# Patient Record
Sex: Female | Born: 1945 | Race: White | Hispanic: No | Marital: Married | Smoking: Never smoker
Health system: Southern US, Community
[De-identification: ages and names within clinical notes are randomized; demographics above are authoritative.]

## PROBLEM LIST (undated history)

## (undated) DIAGNOSIS — I10 Essential (primary) hypertension: Secondary | ICD-10-CM

## (undated) DIAGNOSIS — J189 Pneumonia, unspecified organism: Secondary | ICD-10-CM

## (undated) DIAGNOSIS — H9192 Unspecified hearing loss, left ear: Secondary | ICD-10-CM

## (undated) DIAGNOSIS — J45909 Unspecified asthma, uncomplicated: Secondary | ICD-10-CM

## (undated) DIAGNOSIS — K219 Gastro-esophageal reflux disease without esophagitis: Secondary | ICD-10-CM

## (undated) DIAGNOSIS — G4730 Sleep apnea, unspecified: Secondary | ICD-10-CM

## (undated) HISTORY — PX: CHOLECYSTECTOMY: SHX55

## (undated) HISTORY — PX: TUBAL LIGATION: SHX77

## (undated) HISTORY — PX: ABDOMINAL HYSTERECTOMY: SHX81

---

## 1997-09-13 ENCOUNTER — Ambulatory Visit (HOSPITAL_COMMUNITY): Admission: RE | Admit: 1997-09-13 | Discharge: 1997-09-13 | Payer: Self-pay | Admitting: Obstetrics and Gynecology

## 1997-10-05 ENCOUNTER — Ambulatory Visit (HOSPITAL_COMMUNITY): Admission: RE | Admit: 1997-10-05 | Discharge: 1997-10-05 | Payer: Self-pay | Admitting: Obstetrics and Gynecology

## 1997-11-30 ENCOUNTER — Inpatient Hospital Stay (HOSPITAL_COMMUNITY): Admission: RE | Admit: 1997-11-30 | Discharge: 1997-12-02 | Payer: Self-pay | Admitting: Obstetrics and Gynecology

## 1998-05-13 ENCOUNTER — Ambulatory Visit (HOSPITAL_COMMUNITY): Admission: RE | Admit: 1998-05-13 | Discharge: 1998-05-13 | Payer: Self-pay | Admitting: Internal Medicine

## 1998-05-13 ENCOUNTER — Encounter: Payer: Self-pay | Admitting: Internal Medicine

## 1998-11-07 ENCOUNTER — Encounter: Payer: Self-pay | Admitting: Obstetrics and Gynecology

## 1998-11-07 ENCOUNTER — Encounter: Admission: RE | Admit: 1998-11-07 | Discharge: 1998-11-07 | Payer: Self-pay | Admitting: Obstetrics and Gynecology

## 1999-11-10 ENCOUNTER — Encounter: Admission: RE | Admit: 1999-11-10 | Discharge: 1999-11-10 | Payer: Self-pay | Admitting: Obstetrics and Gynecology

## 1999-11-10 ENCOUNTER — Encounter: Payer: Self-pay | Admitting: Obstetrics and Gynecology

## 1999-12-29 ENCOUNTER — Encounter: Payer: Self-pay | Admitting: Internal Medicine

## 1999-12-29 ENCOUNTER — Encounter: Admission: RE | Admit: 1999-12-29 | Discharge: 1999-12-29 | Payer: Self-pay | Admitting: Internal Medicine

## 2000-01-02 ENCOUNTER — Encounter: Admission: RE | Admit: 2000-01-02 | Discharge: 2000-01-02 | Payer: Self-pay | Admitting: Internal Medicine

## 2000-01-02 ENCOUNTER — Encounter: Payer: Self-pay | Admitting: Internal Medicine

## 2000-10-22 ENCOUNTER — Other Ambulatory Visit: Admission: RE | Admit: 2000-10-22 | Discharge: 2000-10-22 | Payer: Self-pay | Admitting: Obstetrics and Gynecology

## 2000-11-10 ENCOUNTER — Encounter: Payer: Self-pay | Admitting: Obstetrics and Gynecology

## 2000-11-10 ENCOUNTER — Encounter: Admission: RE | Admit: 2000-11-10 | Discharge: 2000-11-10 | Payer: Self-pay | Admitting: Obstetrics and Gynecology

## 2001-09-21 ENCOUNTER — Ambulatory Visit (HOSPITAL_COMMUNITY): Admission: RE | Admit: 2001-09-21 | Discharge: 2001-09-21 | Payer: Self-pay | Admitting: Gastroenterology

## 2001-09-21 ENCOUNTER — Encounter (INDEPENDENT_AMBULATORY_CARE_PROVIDER_SITE_OTHER): Payer: Self-pay | Admitting: *Deleted

## 2001-11-11 ENCOUNTER — Encounter: Payer: Self-pay | Admitting: Obstetrics and Gynecology

## 2001-11-11 ENCOUNTER — Encounter: Admission: RE | Admit: 2001-11-11 | Discharge: 2001-11-11 | Payer: Self-pay | Admitting: Obstetrics and Gynecology

## 2002-11-28 ENCOUNTER — Encounter: Admission: RE | Admit: 2002-11-28 | Discharge: 2002-11-28 | Payer: Self-pay | Admitting: Obstetrics and Gynecology

## 2002-11-30 ENCOUNTER — Encounter: Admission: RE | Admit: 2002-11-30 | Discharge: 2002-11-30 | Payer: Self-pay | Admitting: Obstetrics and Gynecology

## 2003-12-25 ENCOUNTER — Encounter: Admission: RE | Admit: 2003-12-25 | Discharge: 2003-12-25 | Payer: Self-pay | Admitting: Obstetrics and Gynecology

## 2004-12-30 ENCOUNTER — Encounter: Admission: RE | Admit: 2004-12-30 | Discharge: 2004-12-30 | Payer: Self-pay | Admitting: Obstetrics and Gynecology

## 2006-02-04 ENCOUNTER — Encounter: Admission: RE | Admit: 2006-02-04 | Discharge: 2006-02-04 | Payer: Self-pay | Admitting: Obstetrics and Gynecology

## 2007-03-31 IMAGING — MG MM MAMMO SCREENING
4 series · 4 of 4 positions shown · non-contrast
Comparison: none

SCREENING MAMMOGRAM:
There is a fibroglandular pattern.  No masses or malignant type calcifications are identified.  
Compared with prior studies.

[R CC]
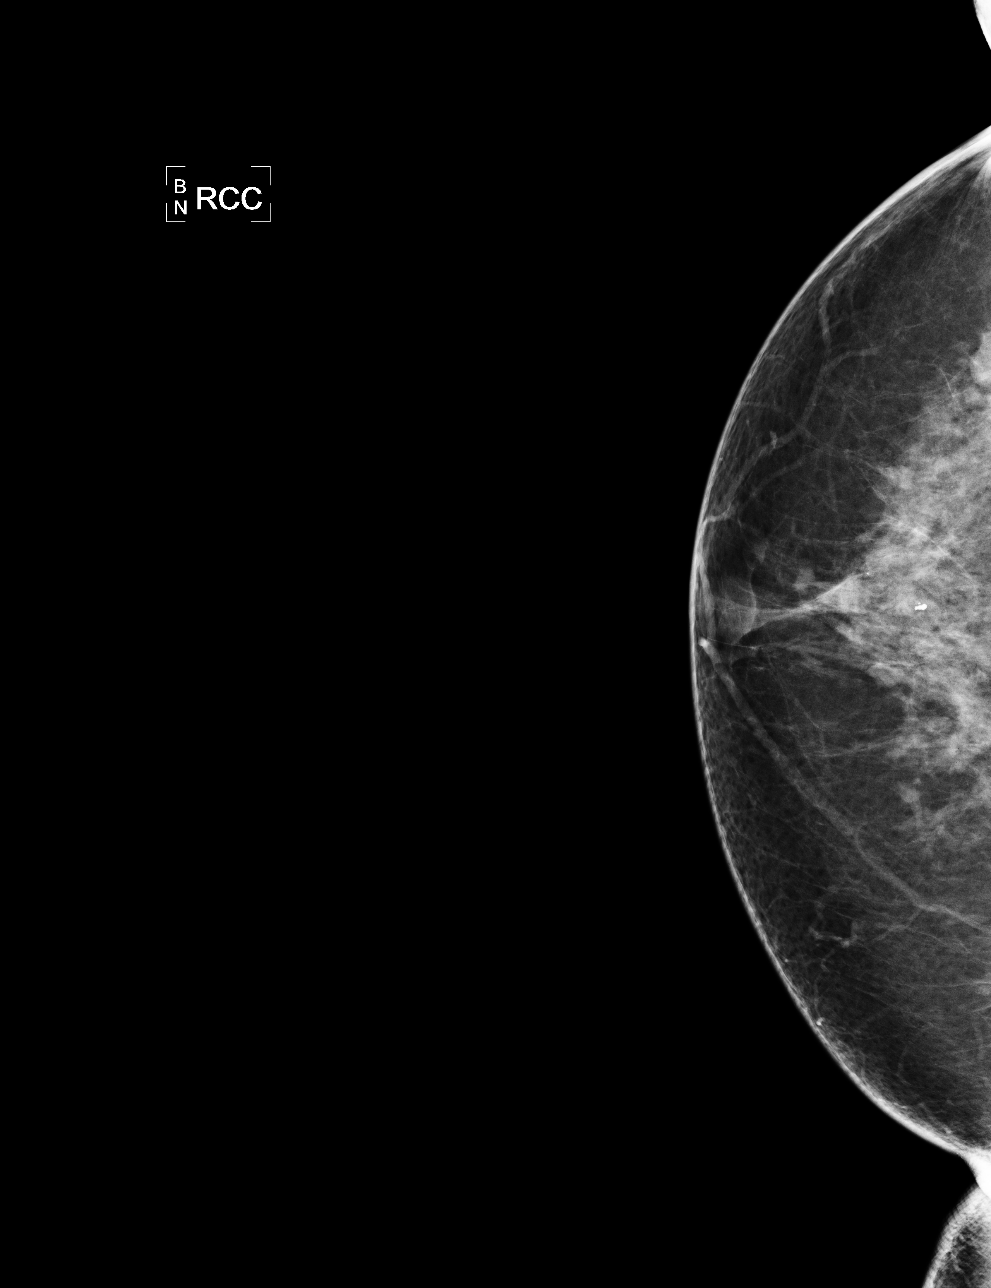

[L CC]
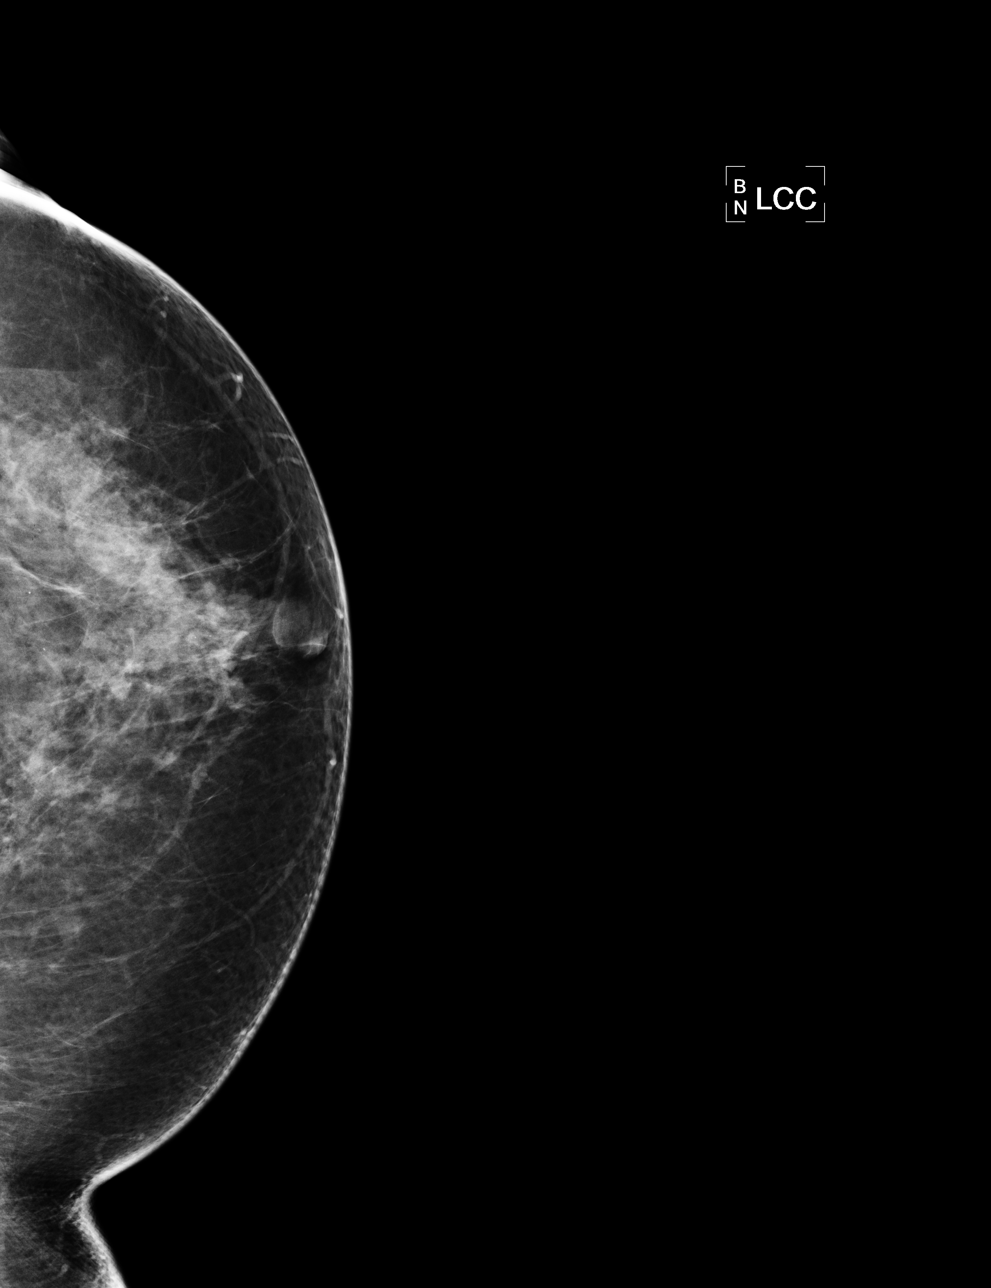

[L MLO]
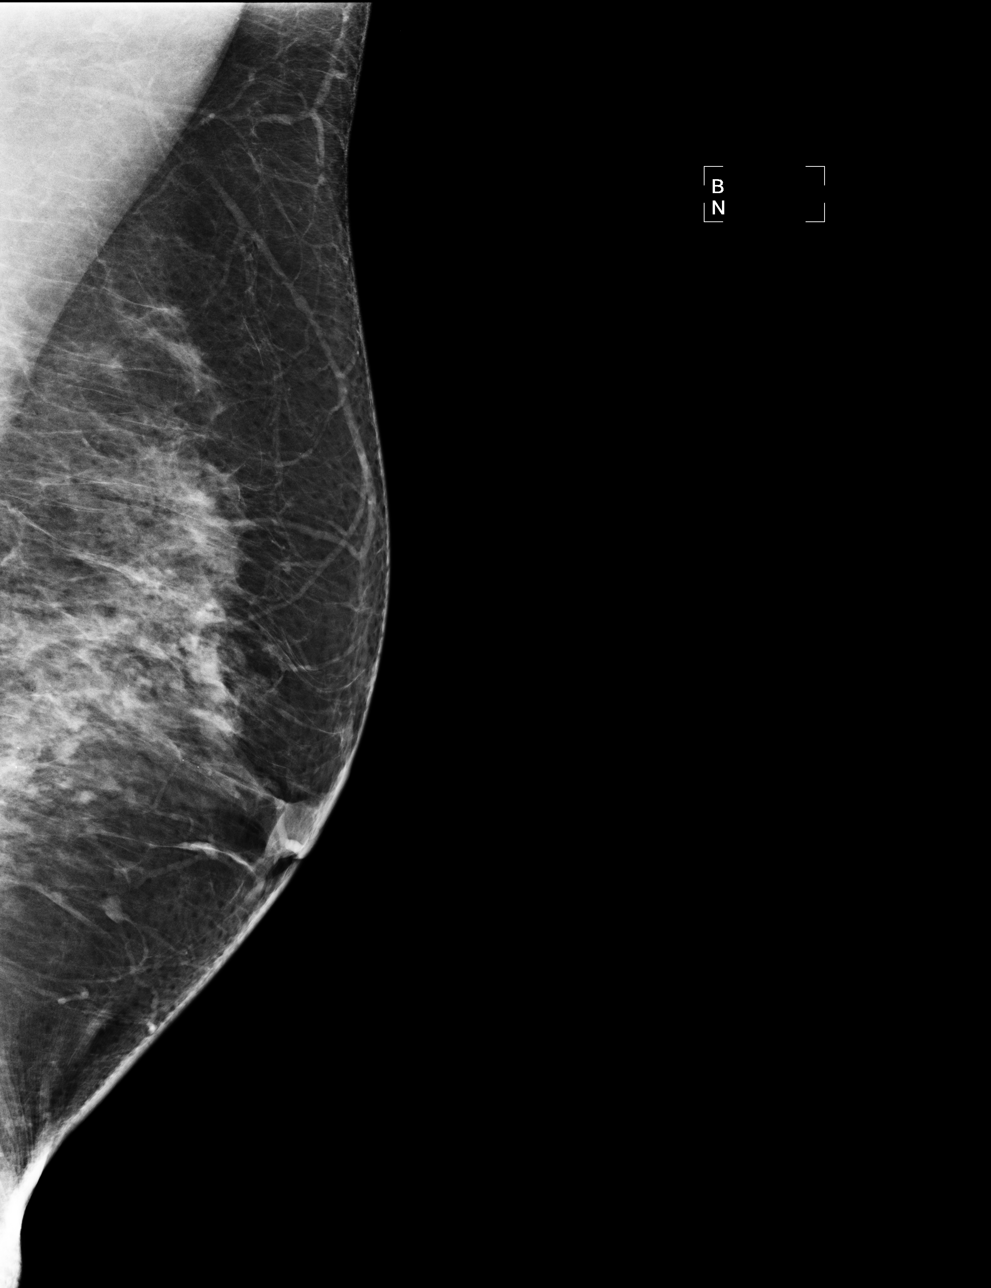

[R MLO]
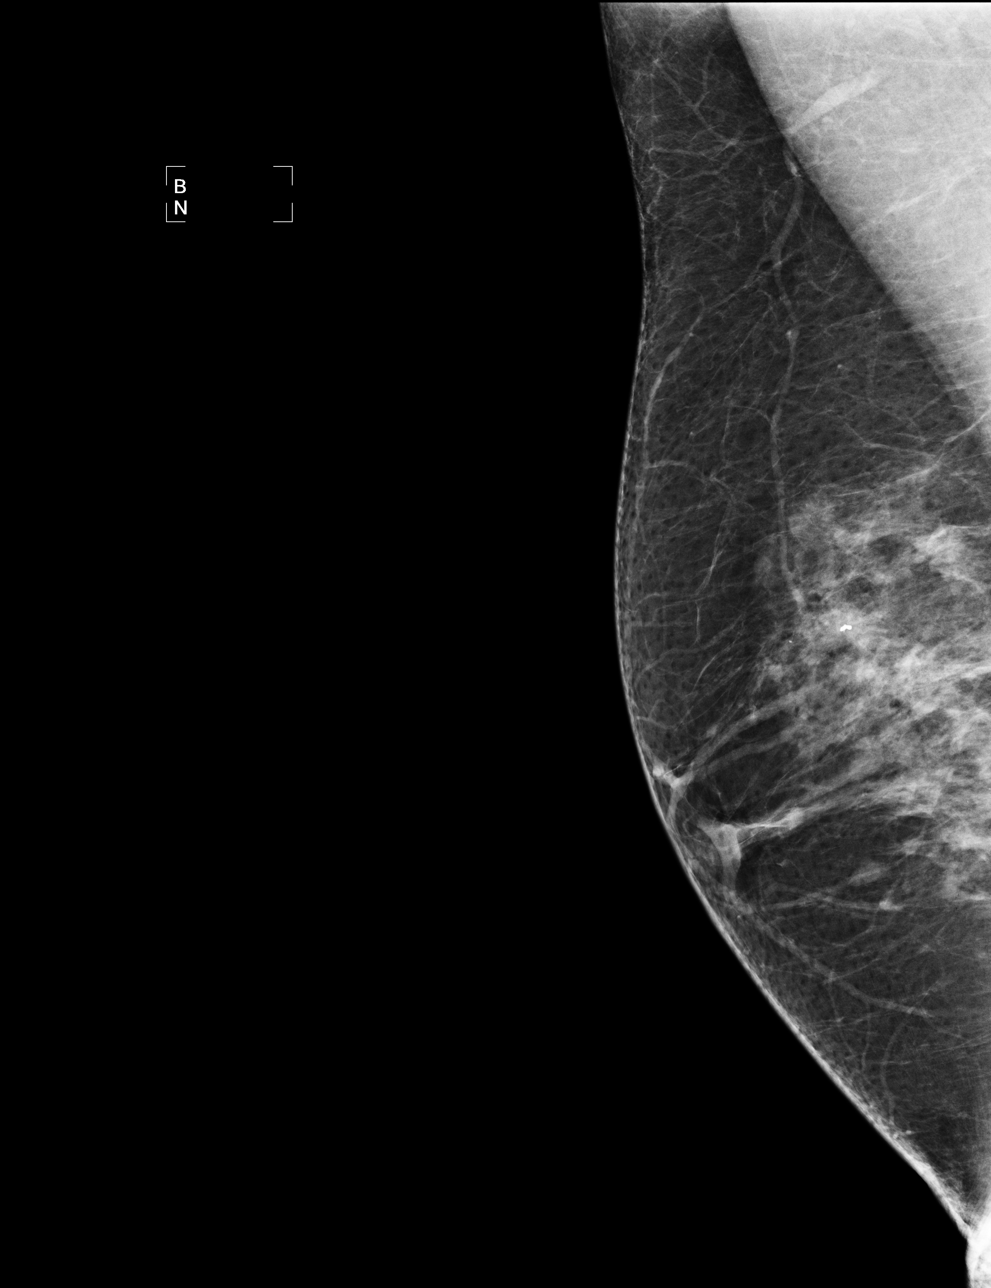

[4 of 4 positions shown; findings below may reference images not displayed]

IMPRESSION: No specific mammographic evidence of malignancy.  Next screening mammogram is recommended in one 
year.

ASSESSMENT: Negative - BI-RADS 1

Screening mammogram in 1 year.

## 2007-05-06 ENCOUNTER — Emergency Department (HOSPITAL_COMMUNITY): Admission: EM | Admit: 2007-05-06 | Discharge: 2007-05-06 | Payer: Self-pay | Admitting: Emergency Medicine

## 2008-02-08 ENCOUNTER — Encounter: Admission: RE | Admit: 2008-02-08 | Discharge: 2008-02-08 | Payer: Self-pay | Admitting: Internal Medicine

## 2008-02-14 ENCOUNTER — Encounter: Admission: RE | Admit: 2008-02-14 | Discharge: 2008-02-14 | Payer: Self-pay | Admitting: Internal Medicine

## 2008-05-05 IMAGING — MG MM SCREEN MAMMOGRAM BILATERAL
5 series · 5 of 5 positions shown · non-contrast
Comparison: none

DG SCREEN MAMMOGRAM BILATERAL
Bilateral CC and MLO view(s) were taken.

DIGITAL SCREENING MAMMOGRAM WITH CAD:
There is a  dense fibroglandular pattern.  No masses or malignant type calcifications are 
identified.  Compared with prior studies.

[R CC]
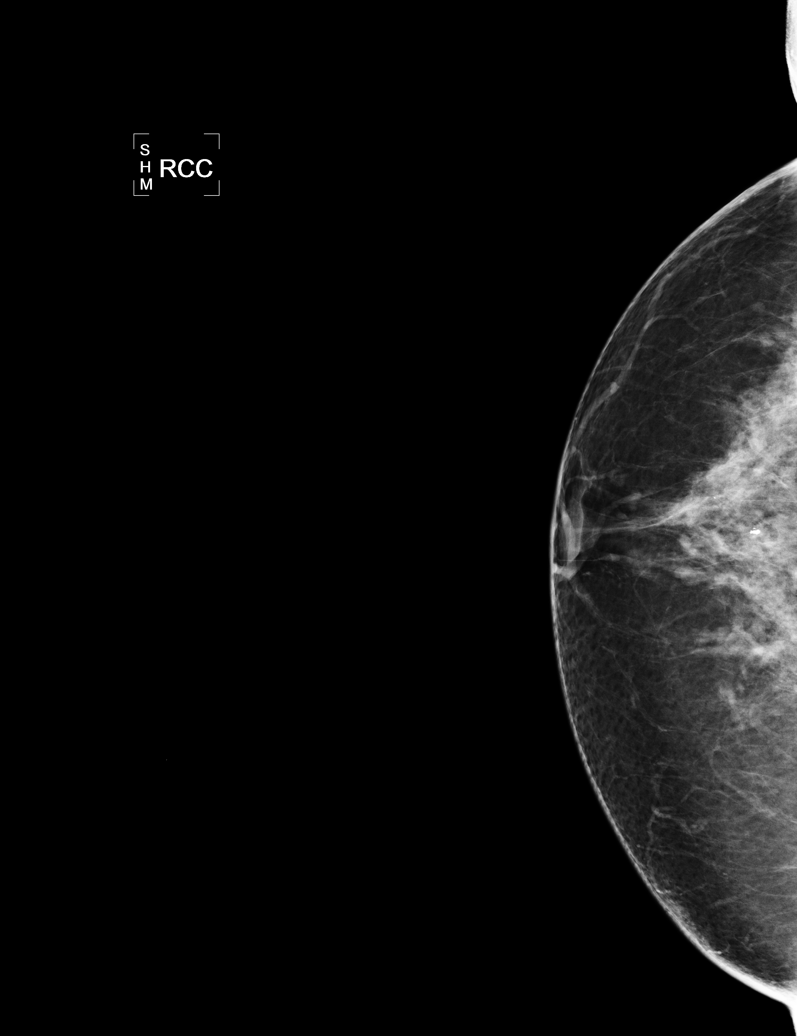

[L CC (1 of 2)]
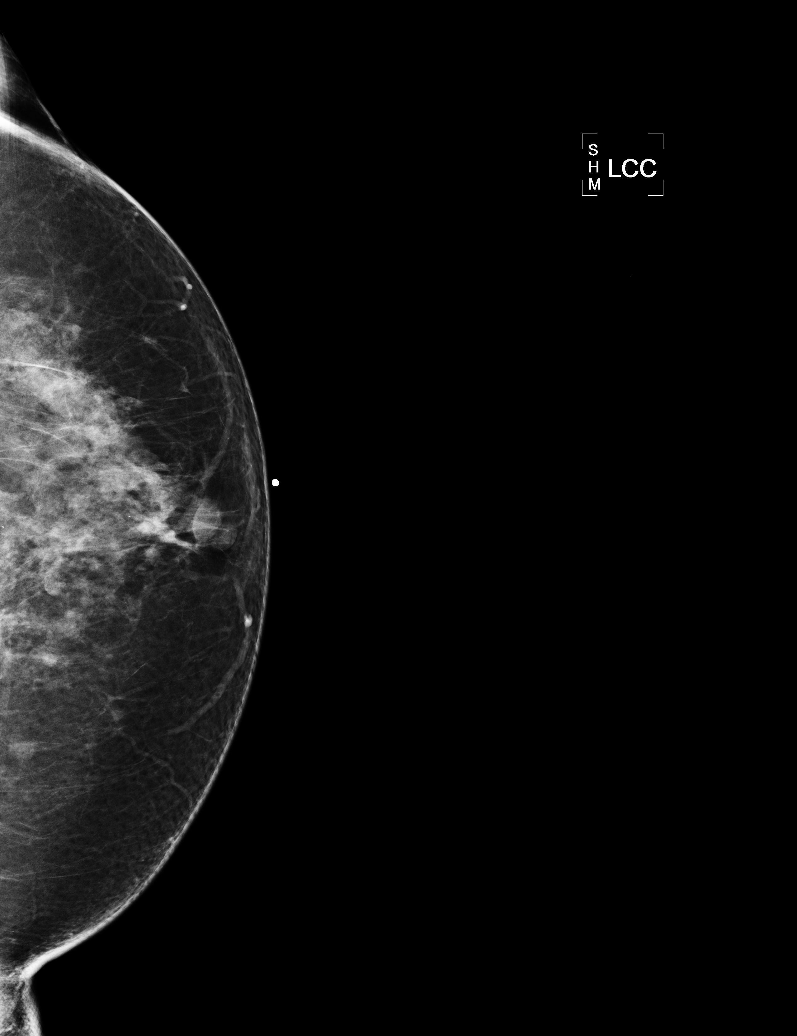

[L MLO]
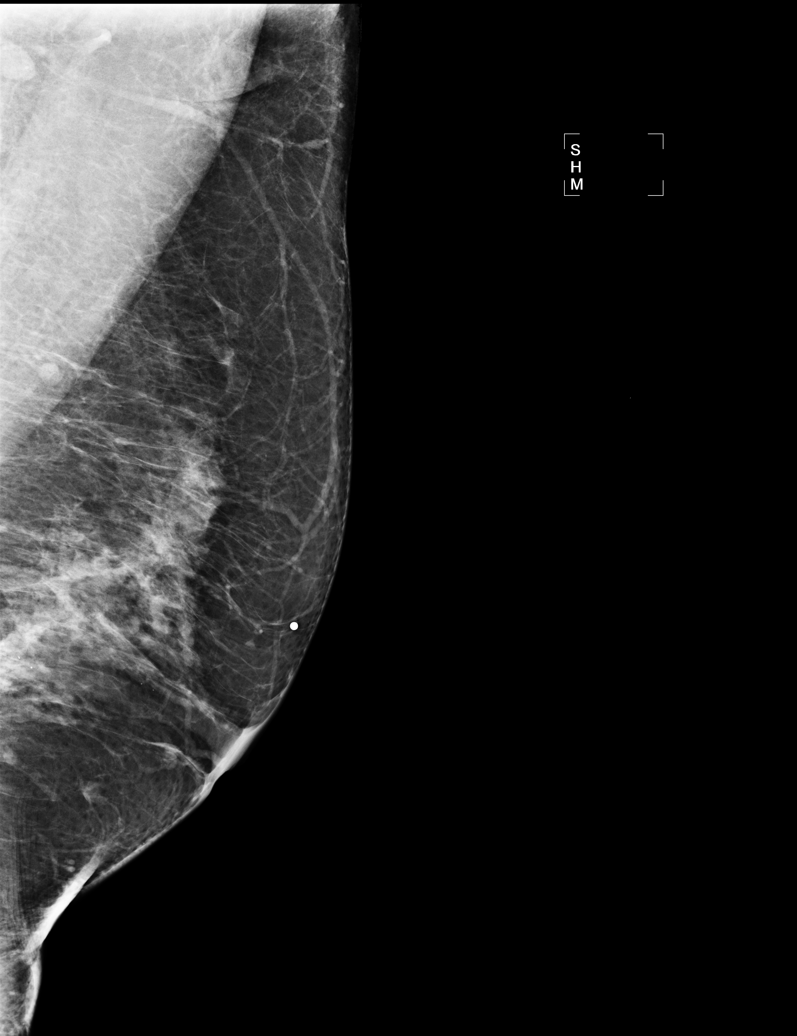

[R MLO]
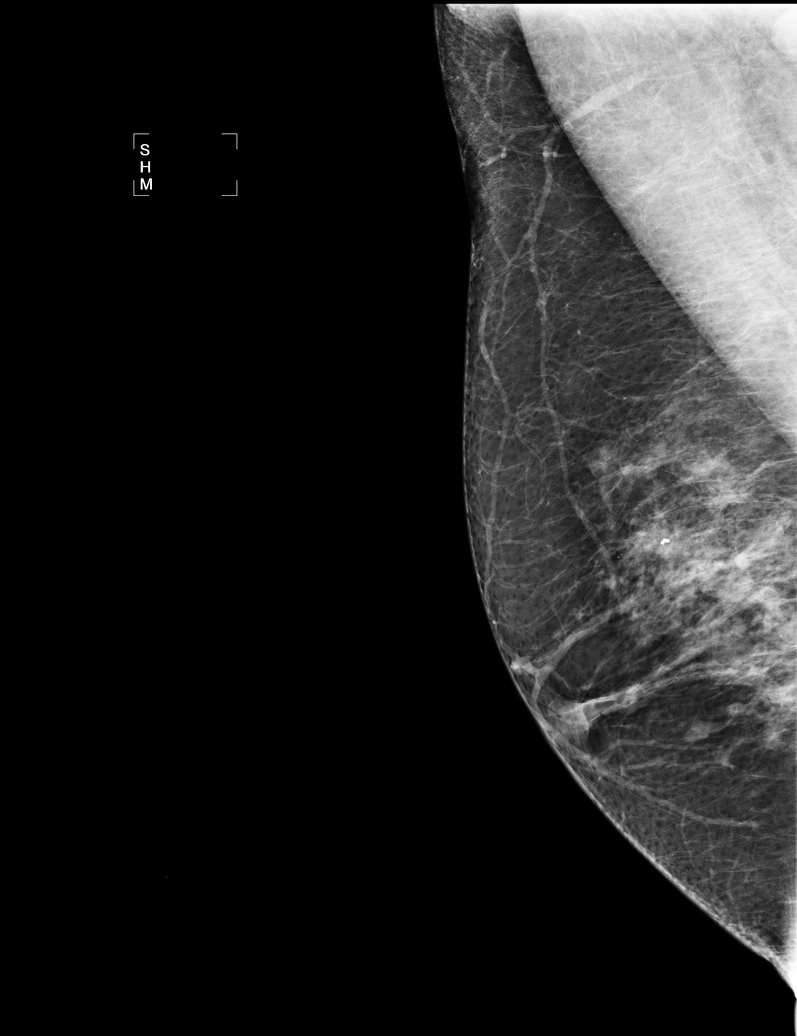

[L CC (2 of 2)]
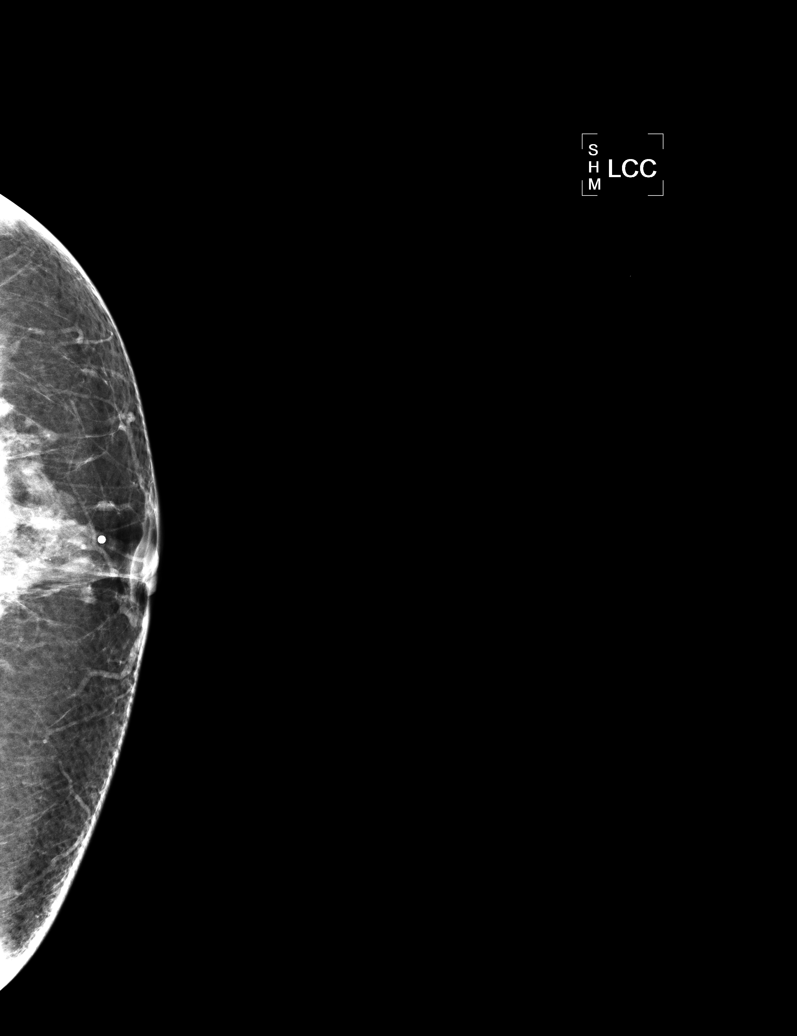

[5 of 5 positions shown; findings below may reference images not displayed]

IMPRESSION: No specific mammographic evidence of malignancy.  Next screening mammogram is recommended in one 
year.

ASSESSMENT: Negative - BI-RADS 1

Screening mammogram in 1 year.
ANALYZED BY COMPUTER AIDED DETECTION. , THIS PROCEDURE WAS A DIGITAL MAMMOGRAM.

## 2009-02-20 ENCOUNTER — Encounter: Admission: RE | Admit: 2009-02-20 | Discharge: 2009-02-20 | Payer: Self-pay | Admitting: Internal Medicine
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
## 2009-08-28 ENCOUNTER — Encounter: Admission: RE | Admit: 2009-08-28 | Discharge: 2009-08-28 | Payer: Self-pay | Admitting: Otolaryngology

## 2010-03-27 ENCOUNTER — Other Ambulatory Visit: Payer: Self-pay | Admitting: Internal Medicine

## 2010-03-27 DIAGNOSIS — Z1231 Encounter for screening mammogram for malignant neoplasm of breast: Secondary | ICD-10-CM

## 2010-04-17 ENCOUNTER — Ambulatory Visit
Admission: RE | Admit: 2010-04-17 | Discharge: 2010-04-17 | Disposition: A | Discharge status: Home / Self Care | Payer: Self-pay | Source: Ambulatory Visit | Attending: Internal Medicine | Admitting: Internal Medicine

## 2010-04-17 ENCOUNTER — Other Ambulatory Visit: Payer: Self-pay | Admitting: Internal Medicine

## 2010-04-17 DIAGNOSIS — Z1231 Encounter for screening mammogram for malignant neoplasm of breast: Secondary | ICD-10-CM

## 2010-04-17 DIAGNOSIS — N6325 Unspecified lump in the left breast, overlapping quadrants: Secondary | ICD-10-CM

## 2010-04-22 ENCOUNTER — Ambulatory Visit
Admission: RE | Admit: 2010-04-22 | Discharge: 2010-04-22 | Disposition: A | Discharge status: Home / Self Care | Payer: 59 | Source: Ambulatory Visit | Attending: Internal Medicine | Admitting: Internal Medicine

## 2010-04-22 ENCOUNTER — Other Ambulatory Visit: Payer: Self-pay | Admitting: Internal Medicine

## 2010-04-22 DIAGNOSIS — N6325 Unspecified lump in the left breast, overlapping quadrants: Secondary | ICD-10-CM

## 2010-05-09 IMAGING — MG MM SCREEN MAMMOGRAM BILATERAL
4 series · 4 of 4 positions shown · non-contrast
Comparison: none

DG SCREEN MAMMOGRAM BILATERAL
Bilateral CC and MLO view(s) were taken.
Technologist: Gitte Juul Zawadi

DIGITAL SCREENING MAMMOGRAM WITH CAD:
There are scattered fibroglandular densities.  A possible mass is noted in the right breast.  Spot 
compression views and possibly sonography are recommended for further evaluation.  In the left 
breast, no masses or malignant type calcifications are identified.  Compared with prior studies.

[R CC]
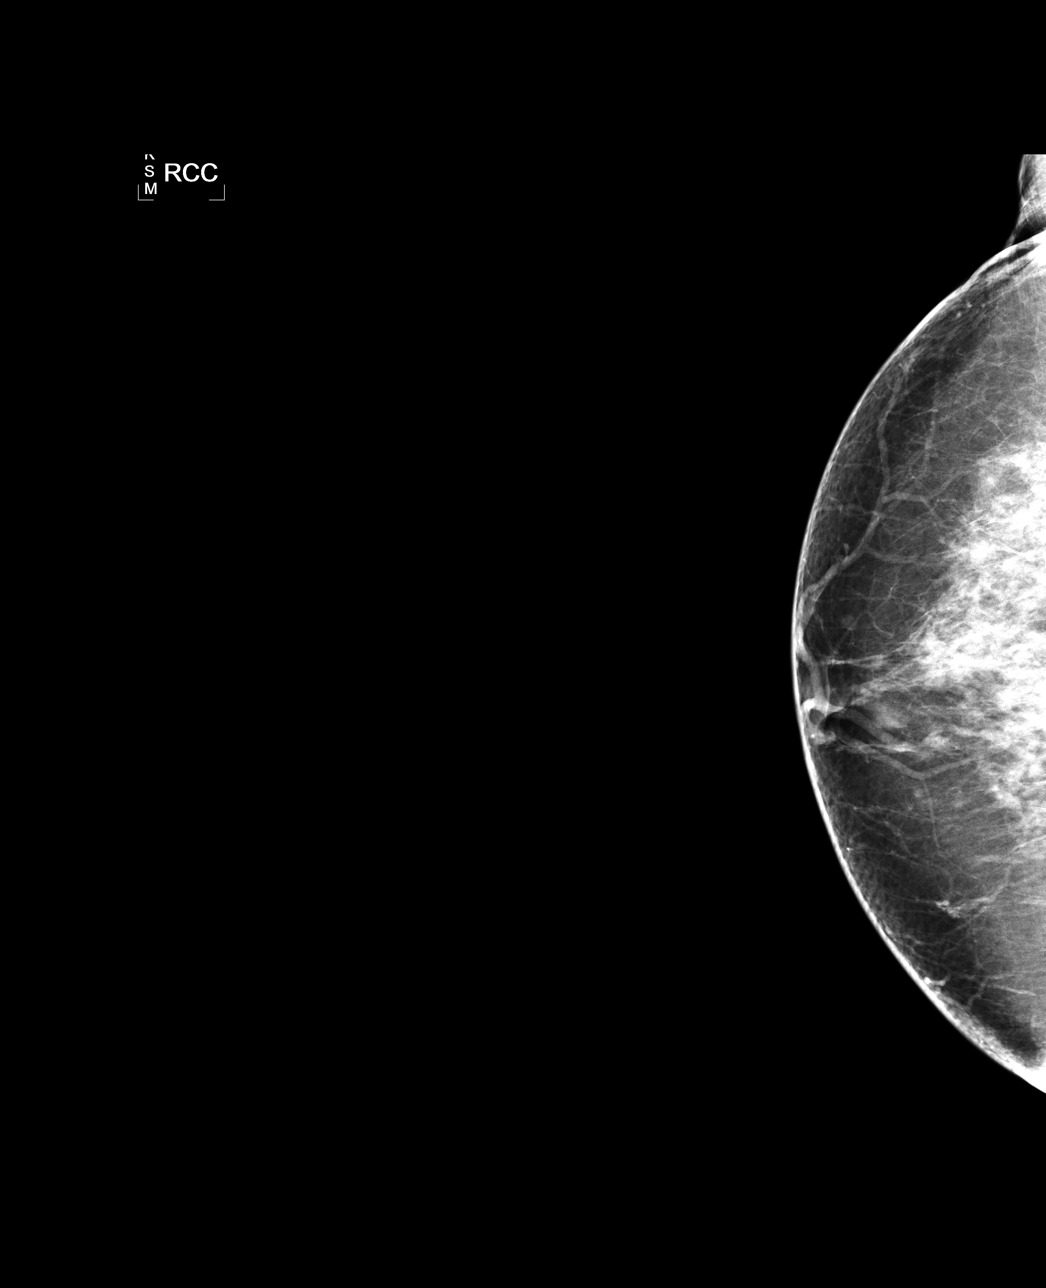

[L CC]
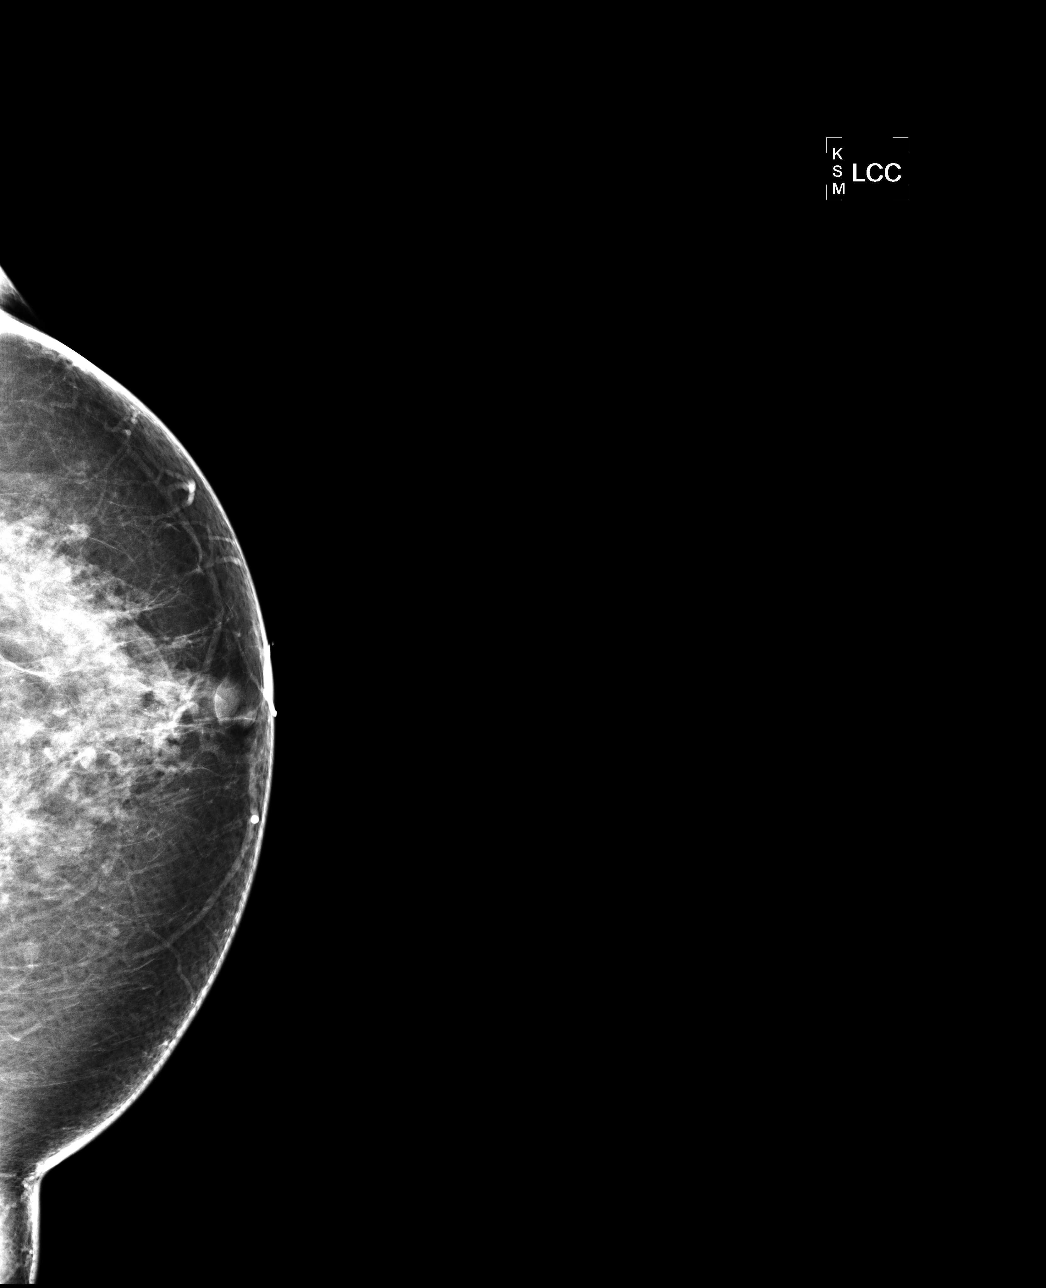

[L MLO]
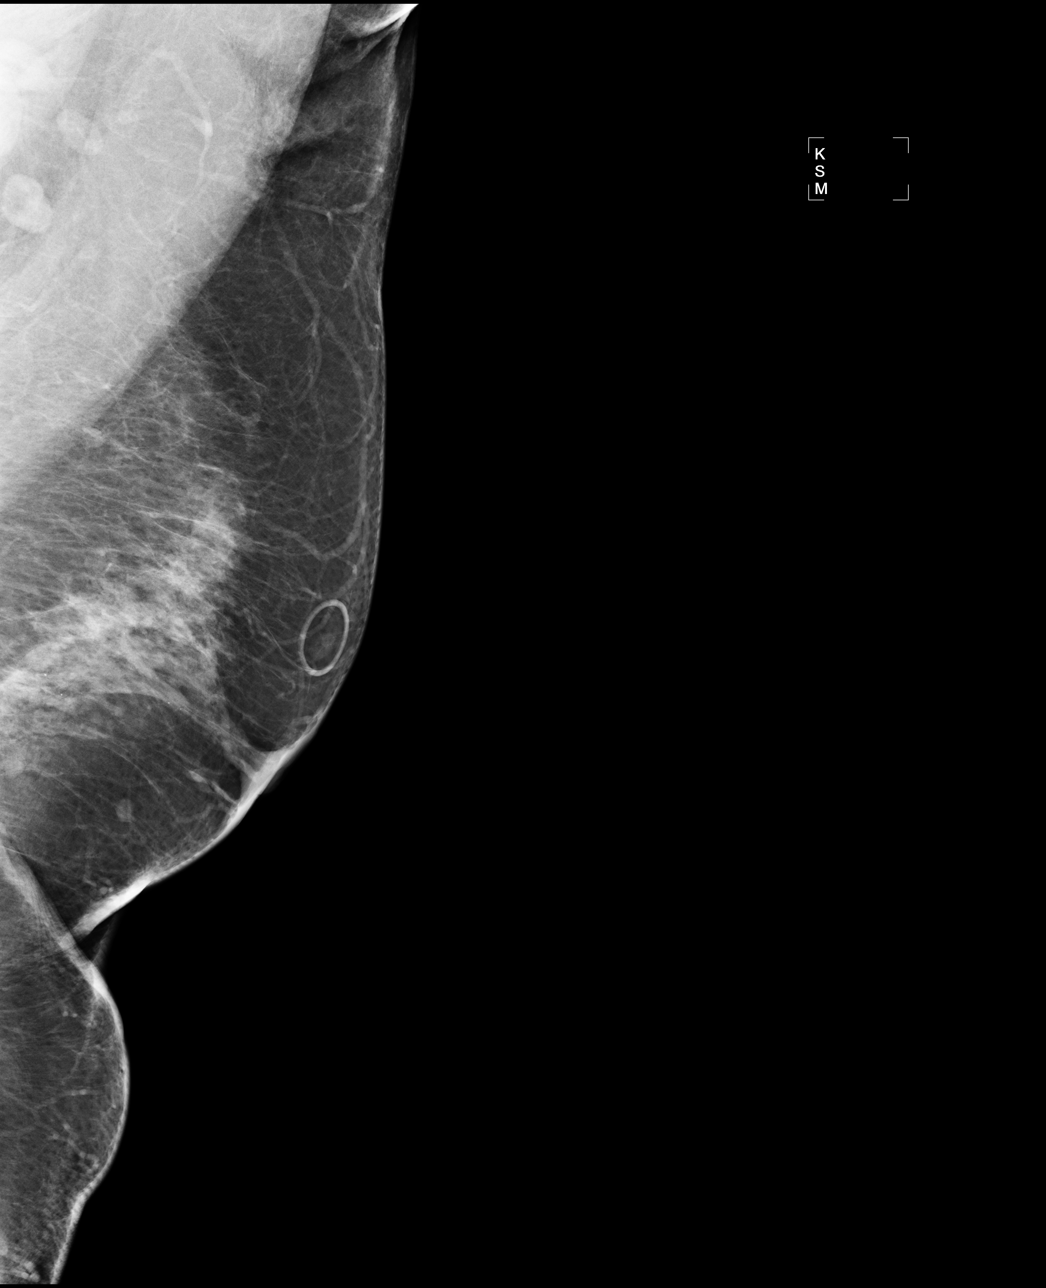

[R MLO]
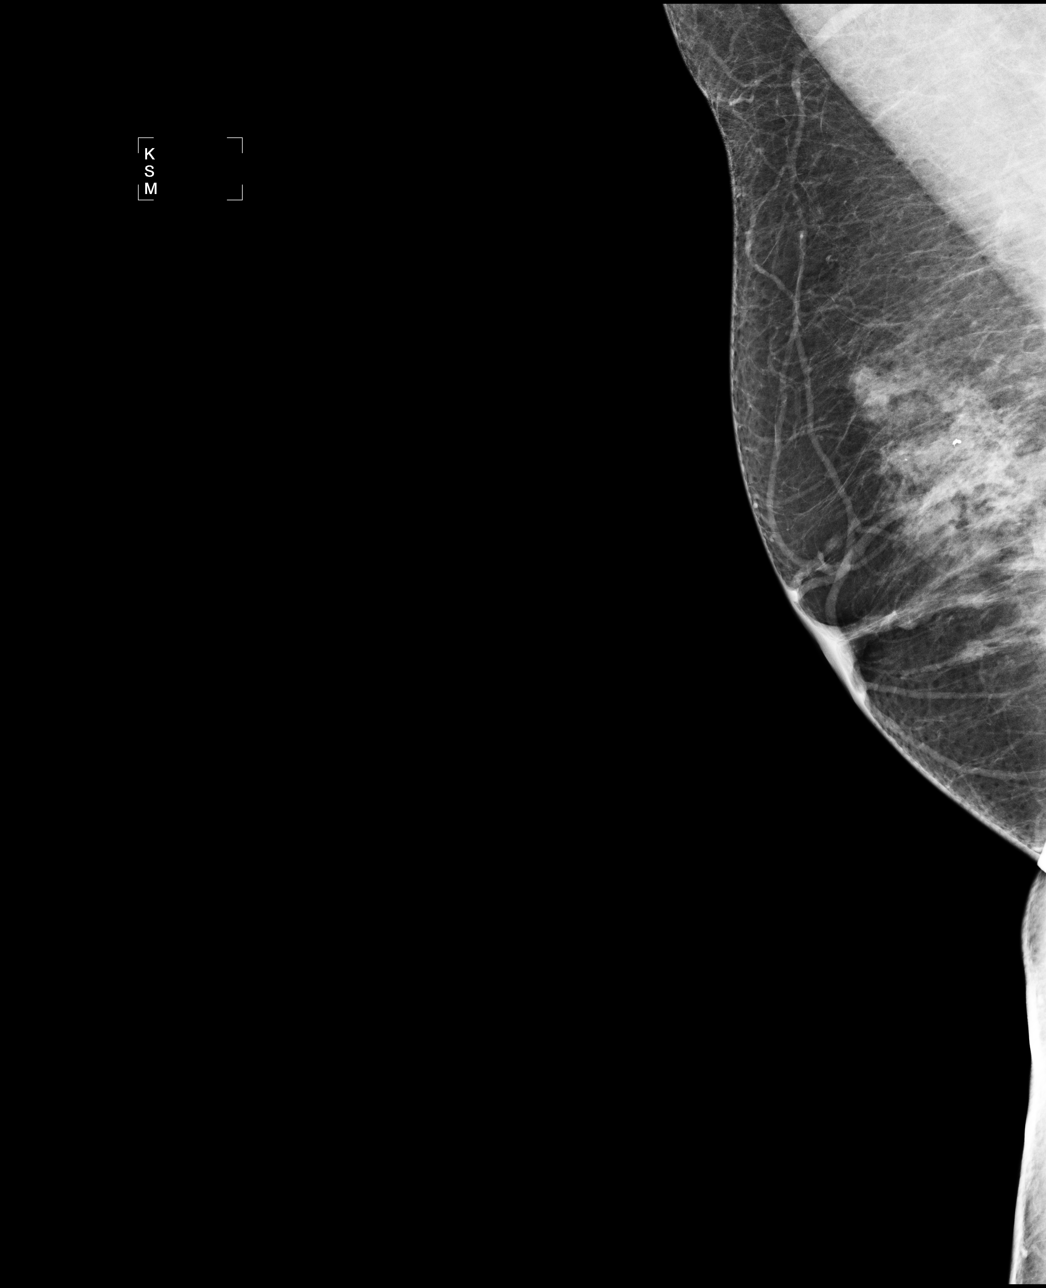

[4 of 4 positions shown; findings below may reference images not displayed]

IMPRESSION: Possible mass, right breast.  Additional evaluation is indicated.  The patient will be contacted 
for additional studies and a supplementary report will follow.  No specific mammographic evidence 
of malignancy, left breast.

ASSESSMENT: Need additional imaging evaluation and/or prior mammograms for comparison - BI-RADS 0

Further imaging of the right breast.
ANALYZED BY COMPUTER AIDED DETECTION. , THIS PROCEDURE WAS A DIGITAL MAMMOGRAM.

## 2010-05-15 IMAGING — MG MM DIAGNOSTIC LTD RIGHT
2 series · 2 of 2 positions shown · non-contrast
Comparison: 12/30/2004 and 02/04/2006

CLINICAL DATA: The patient returns for evaluation of a possible
asymmetry in the right breast noted on recent screening study dated
02/08/2008.

DIGITAL DIAGNOSTIC  RIGHT LIMITED  MAMMOGRAM  WITH CAD AND RIGHT
BREAST ULTRASOUND:

[R MLO]
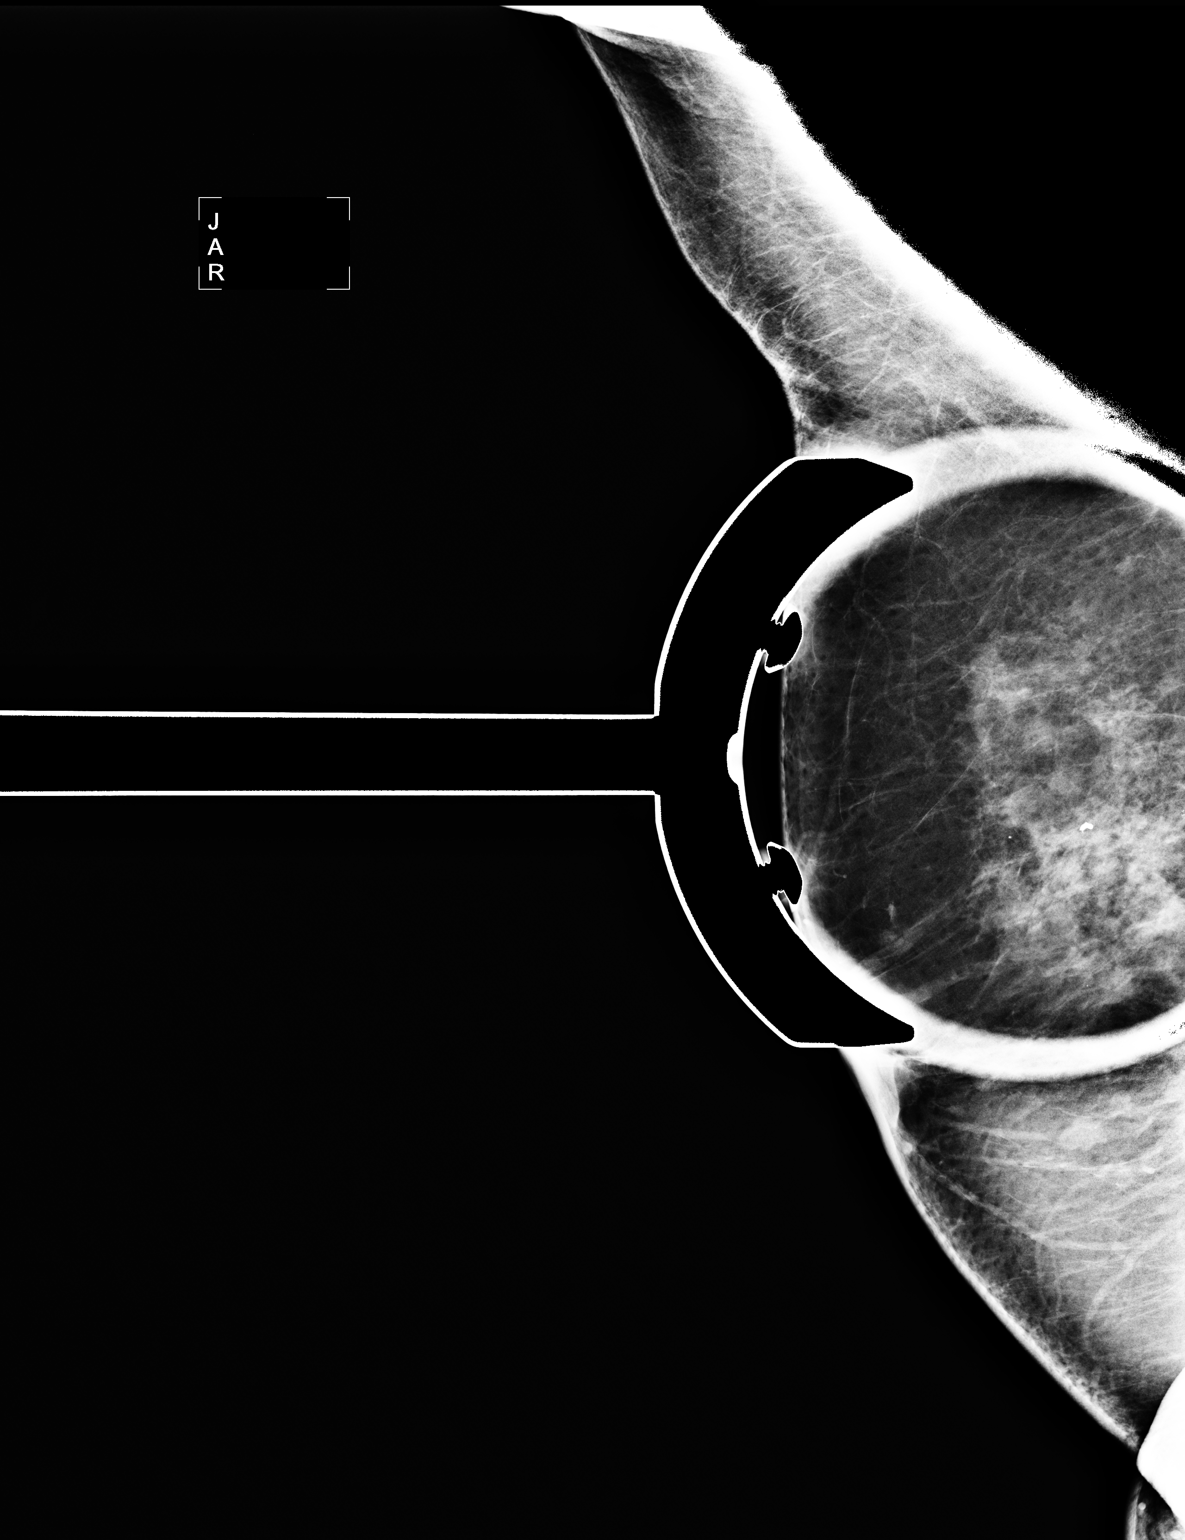

[R ML]
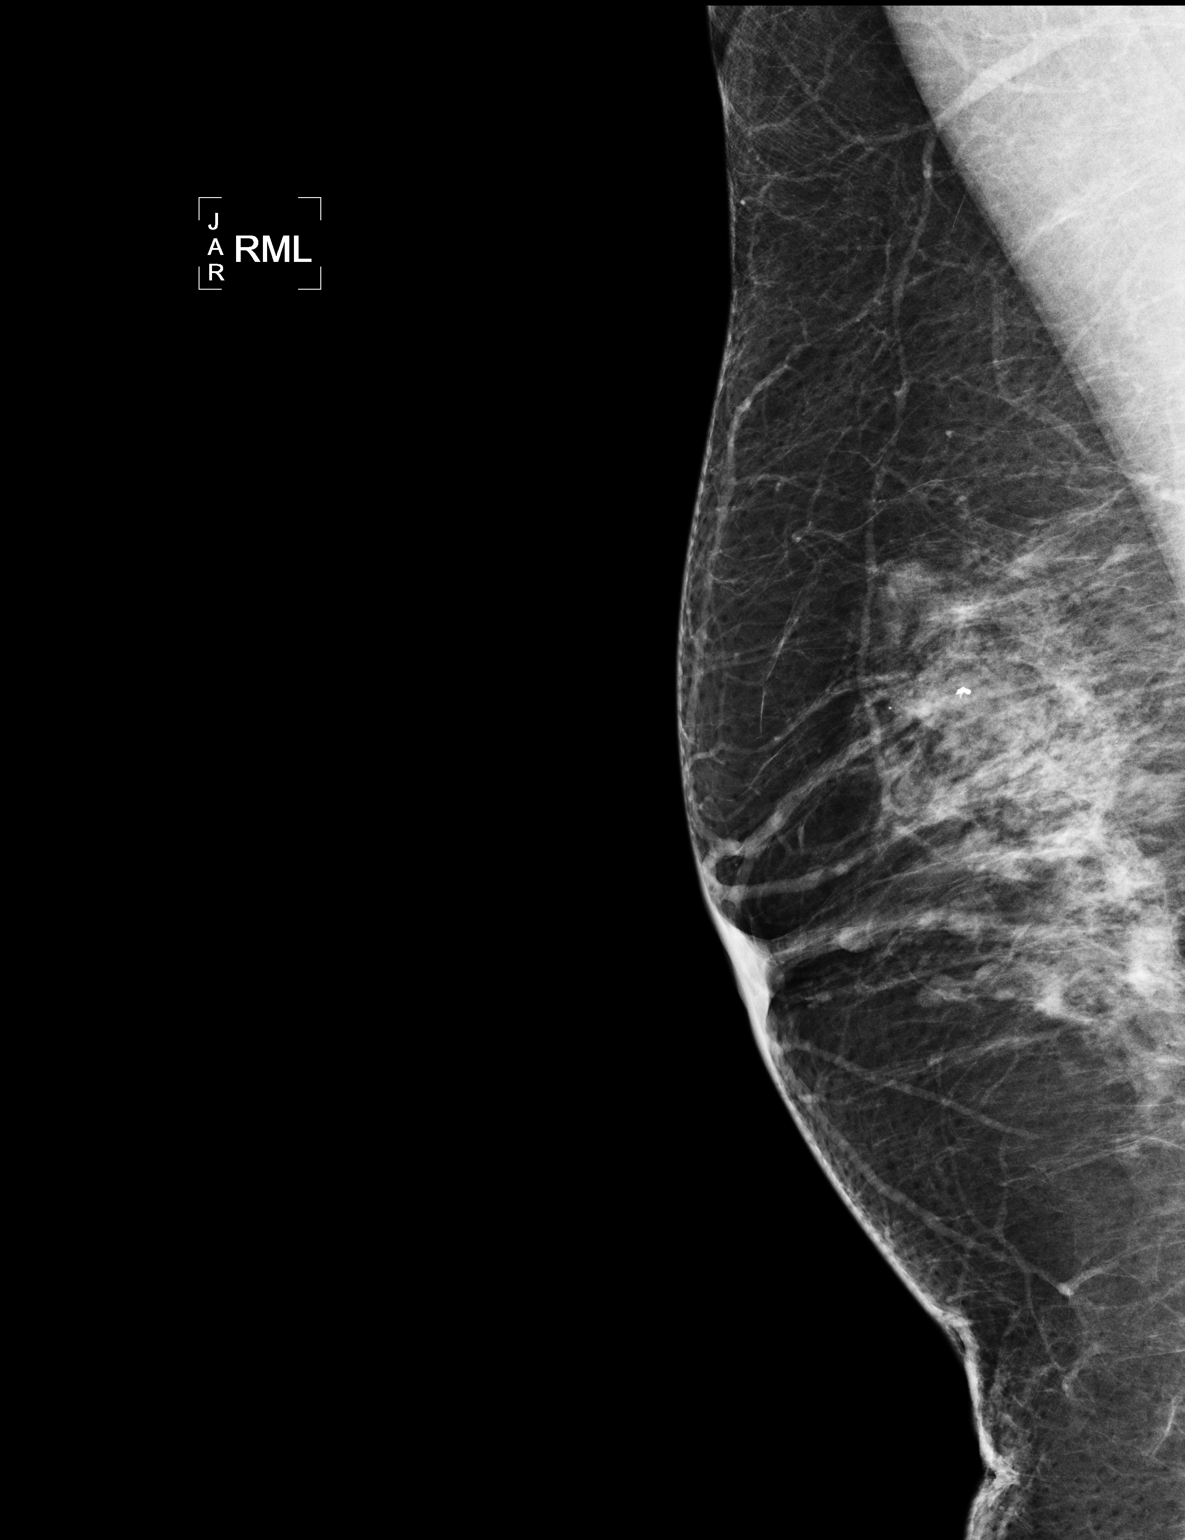

[2 of 2 positions shown; findings below may reference images not displayed]

FINDINGS: Additional views demonstrate no persistent worrisome mass
or distortion.

On physical exam, no mass is palpated in the upper half of the
right breast.

Ultrasound is performed, showing mixed fibroglandular tissue and
fat with scattered nonpalpable subcentimeter cysts.  No solid mass,
distortion or shadowing to suggest malignancy is identified.
IMPRESSION: No persistent worrisome abnormality upon additional imaging the
right breast.  Yearly screening mammography is suggested.

BI-RADS CATEGORY 2:  Benign finding(s).

REF:B1 DICTATED: 02/14/2008 [DATE]

## 2010-05-15 IMAGING — US UNKNOWN US STUDY
1 series · 2 of 2 positions shown · non-contrast
Comparison: 12/30/2004 and 02/04/2006

CLINICAL DATA: The patient returns for evaluation of a possible
asymmetry in the right breast noted on recent screening study dated
02/08/2008.

DIGITAL DIAGNOSTIC  RIGHT LIMITED  MAMMOGRAM  WITH CAD AND RIGHT
BREAST ULTRASOUND:

[Series 1: unknown us study · 2 of 2 slices shown]
[im 1/2]
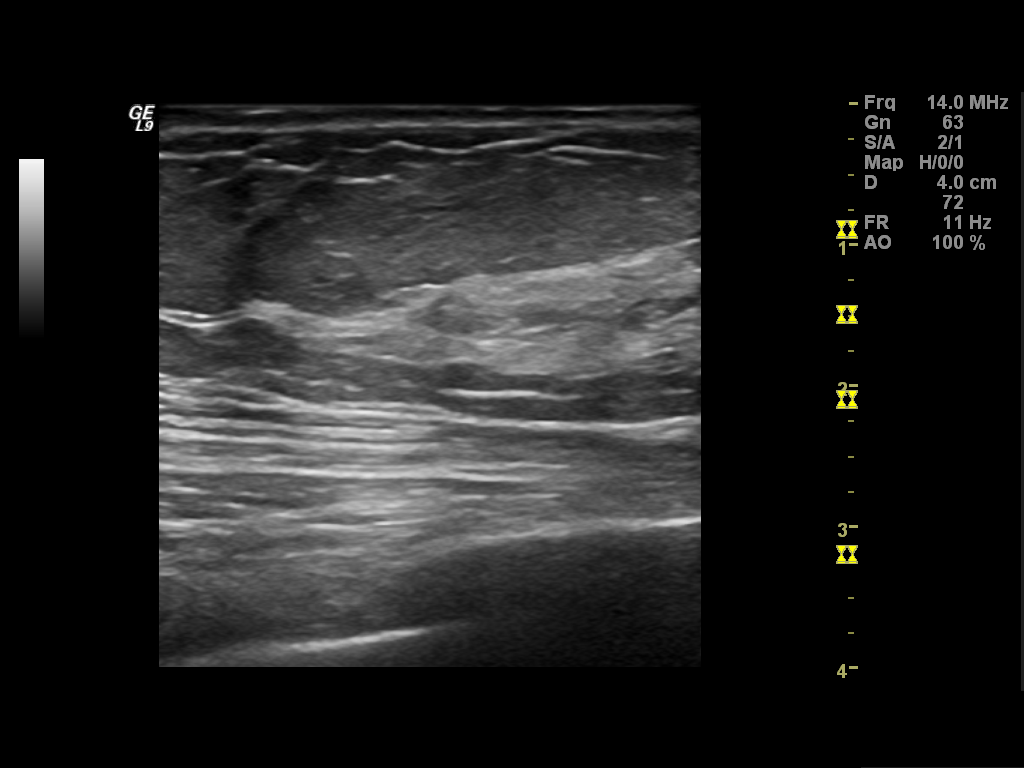
[im 2/2]
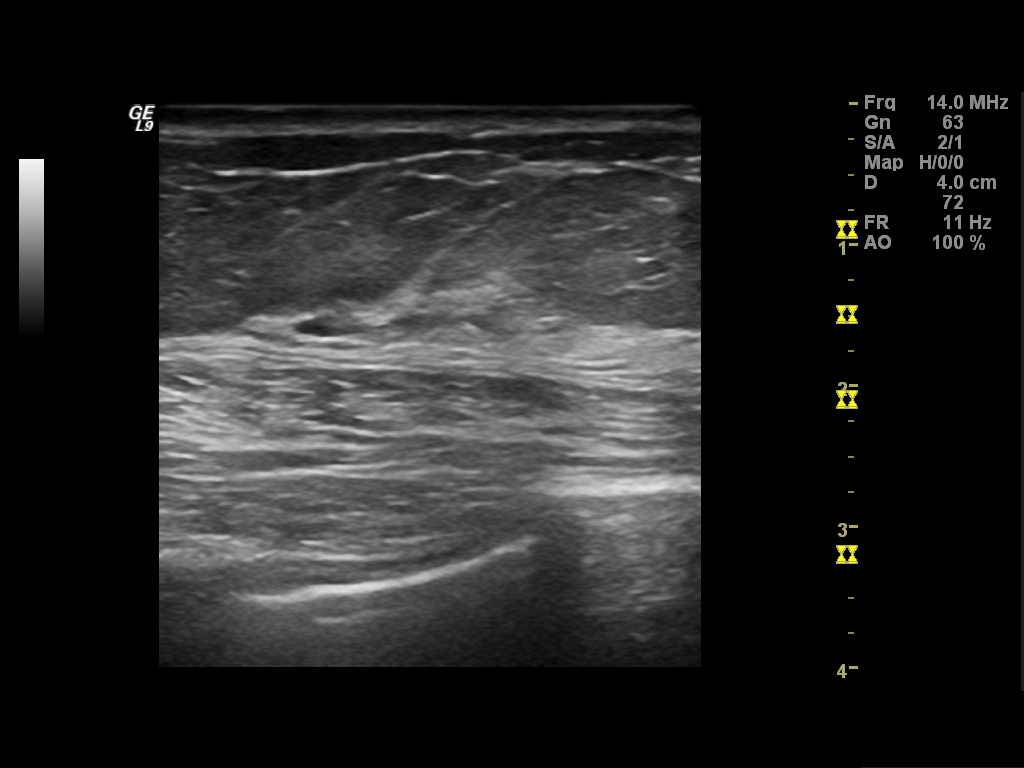

[2 of 2 positions shown; findings below may reference images not displayed]

FINDINGS: Additional views demonstrate no persistent worrisome mass
or distortion.

On physical exam, no mass is palpated in the upper half of the
right breast.

Ultrasound is performed, showing mixed fibroglandular tissue and
fat with scattered nonpalpable subcentimeter cysts.  No solid mass,
distortion or shadowing to suggest malignancy is identified.
IMPRESSION: No persistent worrisome abnormality upon additional imaging the
right breast.  Yearly screening mammography is suggested.

BI-RADS CATEGORY 2:  Benign finding(s).

REF:B1 DICTATED: 02/14/2008 [DATE]

## 2010-05-30 NOTE — Op Note (Signed)
[name], [name]                       ACCOUNT NO.:  [id number]   MEDICAL RECORD NO.:  [id number]                   PATIENT TYPE:  AMB   LOCATION:  ENDO                                 FACILITY:  [facility]   PHYSICIAN:  Petra Kuba, M.D.                 DATE OF BIRTH:  1945/01/25   DATE OF PROCEDURE:  09/21/2001  DATE OF DISCHARGE:                                 OPERATIVE REPORT   PROCEDURE:  Colonoscopy with polypectomy.   INDICATIONS FOR PROCEDURE:  Family history of colon cancer.  Consent was  signed after risks, benefits, methods, and options thoroughly discussed by  partner [name] prior to sedation.   ANESTHESIA:  Demerol 80 mg, Versed 10 mg.   PROCEDURE:  Rectal inspection was pertinent for external hemorrhoids, small.  Digital exam was negative.  The pediatric video adjustable colonoscope was  inserted and easily advanced from the colon to the cecum.  This did require  some abdominal pressure but no position changes.  Cecum was identified by  the appendiceal orifice and the ileocecal valve.  The scope was inserted a  short ways into the terminal ileum, which was normal, photo documentation  was obtained.  No obvious abnormality was seen on the insertion.  The scope  was slowly withdrawn.   On slow withdrawal, the cecum, ascending, and transverse were normal.  The  scope was withdrawn around the left side and there were some very early left-  sided diverticula.  There were two tiny distal descending and mid sigmoid  polyps which were each hot-biopsied and in the distal sigmoid another small  polyp was seen, snare electrocautery applied and the polyp was suctioned  through the scope and  collected in the trap.  Scope was withdrawn back to  the rectum and retroflexed, pertinent for some internal hemorrhoids.  The  scope was straightened and readvanced a short ways up the left side of the  colon, air was suctioned, scope was removed.   The patient tolerated the  procedure well and there was no obvious immediate  complication.   ENDOSCOPIC DIAGNOSES:  1. Internal/external hemorrhoids.  2. Early left-sided diverticula.  3. Three small sigmoid and distal descending polyps; one snared, distal     sigmoid; two hot-biopsied in the sigmoid and descending.  4. Otherwise, within normal limits to the terminal ileum.    PLAN:  Await pathology to determine future colonic screening.  Otherwise,  return care to Drs.  _______ and Ambrose Shea for the customary health care  maintenance, yearly rectals and guaiac.  I will be happy to see him back  sooner p.r.n.                                               Petra Kuba, M.D.  MEM/MEDQ  D:  09/21/2001  T:  09/21/2001  Job:  [id number]   cc:   [name], M.D.  [address]  [place]  [place] [address]  Fax: [phone]

## 2011-05-22 IMAGING — MG MM DIGITAL SCREENING
5 series · 5 of 5 positions shown · non-contrast
Comparison: none

DG SCREEN MAMMOGRAM BILATERAL
Bilateral CC and MLO view(s) were taken.
Technologist: Nomasibulele Moatshe

DIGITAL SCREENING MAMMOGRAM WITH CAD:
There are scattered fibroglandular densities.  No masses or malignant type calcifications are 
identified.  Compared with prior studies.
Images were processed with CAD.

[R CC]
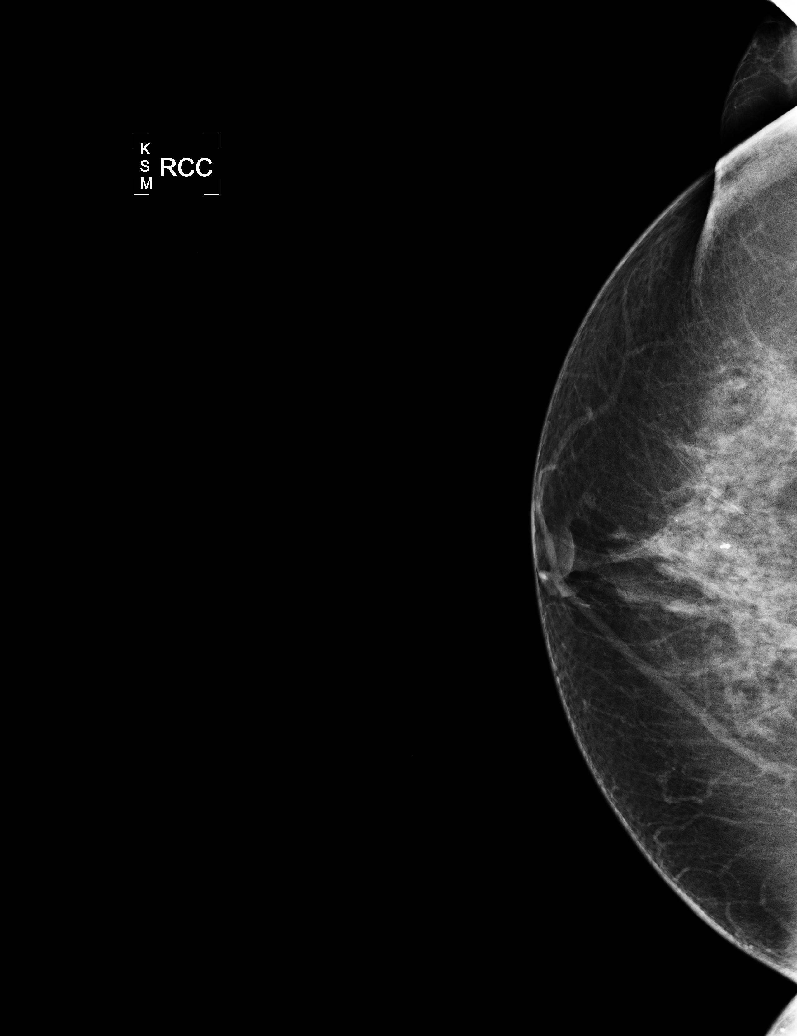

[L CC]
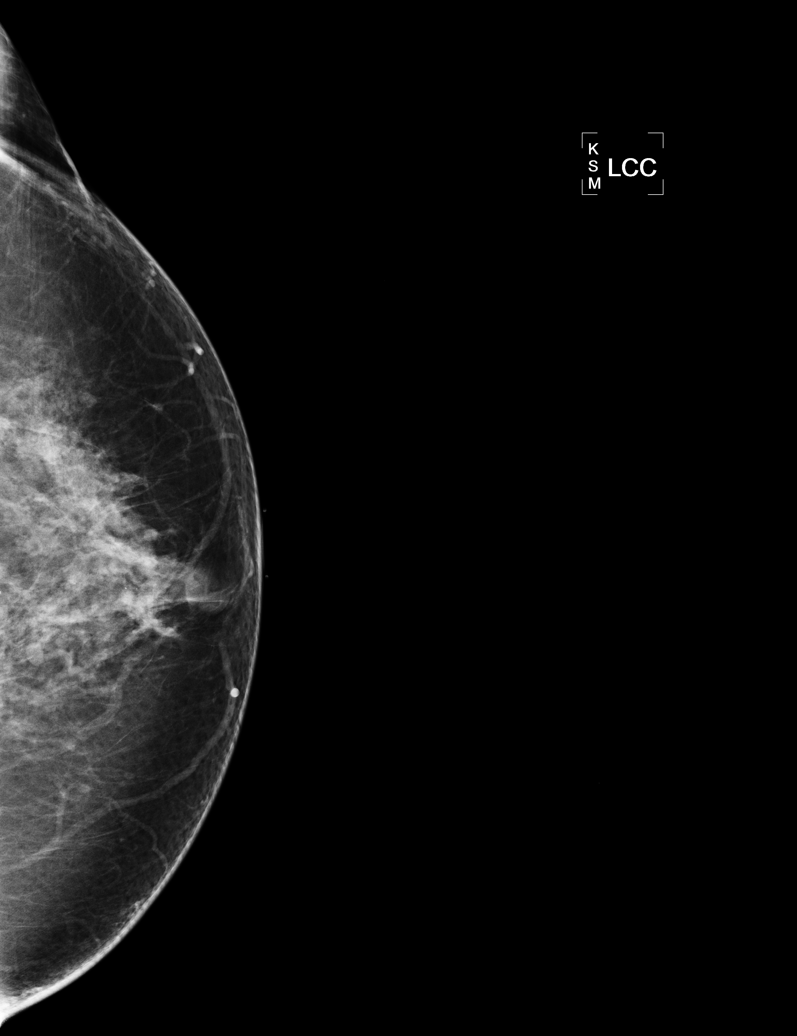

[L MLO (1 of 2)]
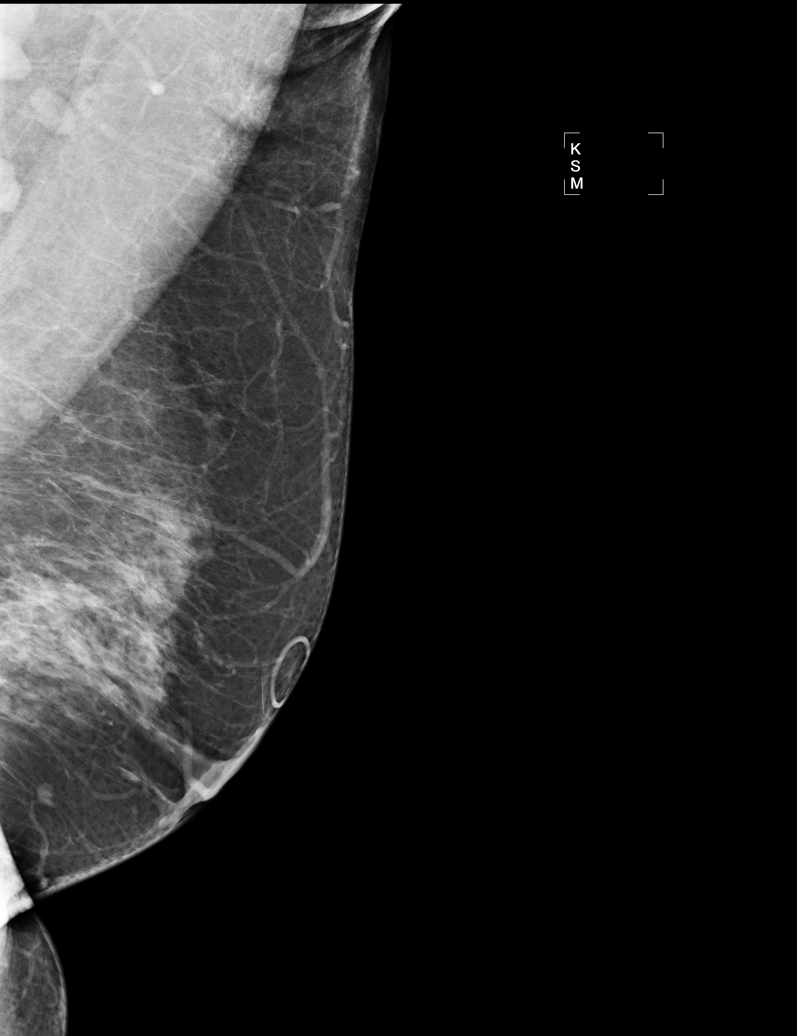

[R MLO]
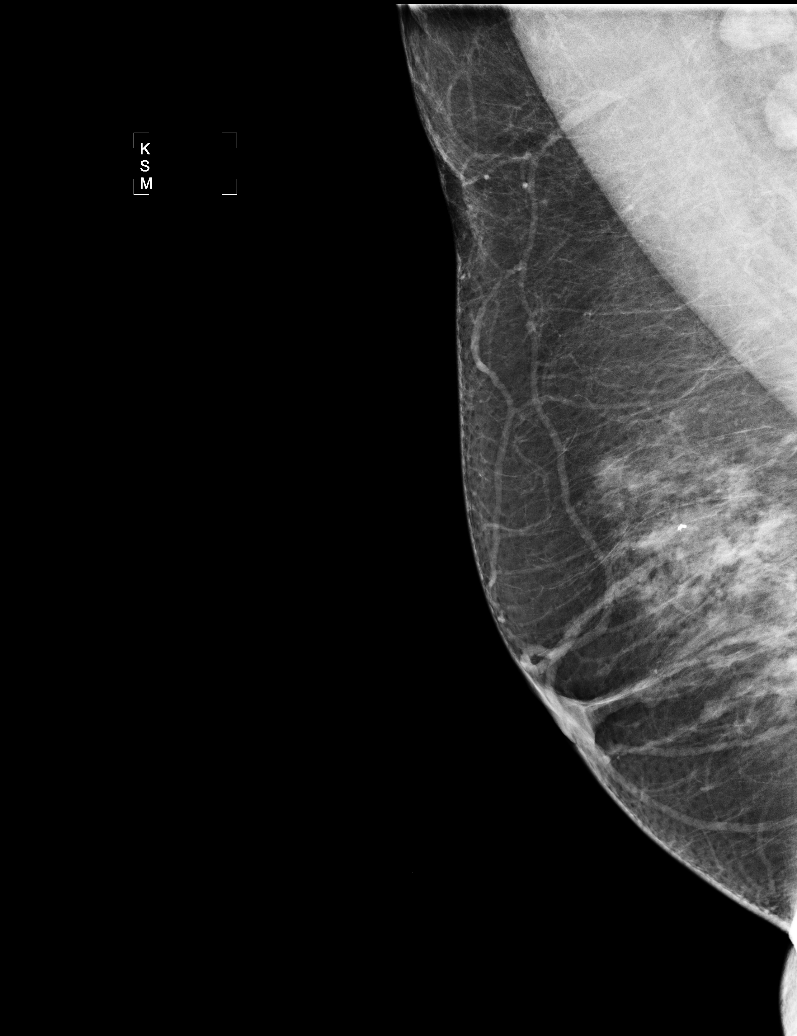

[L MLO (2 of 2)]
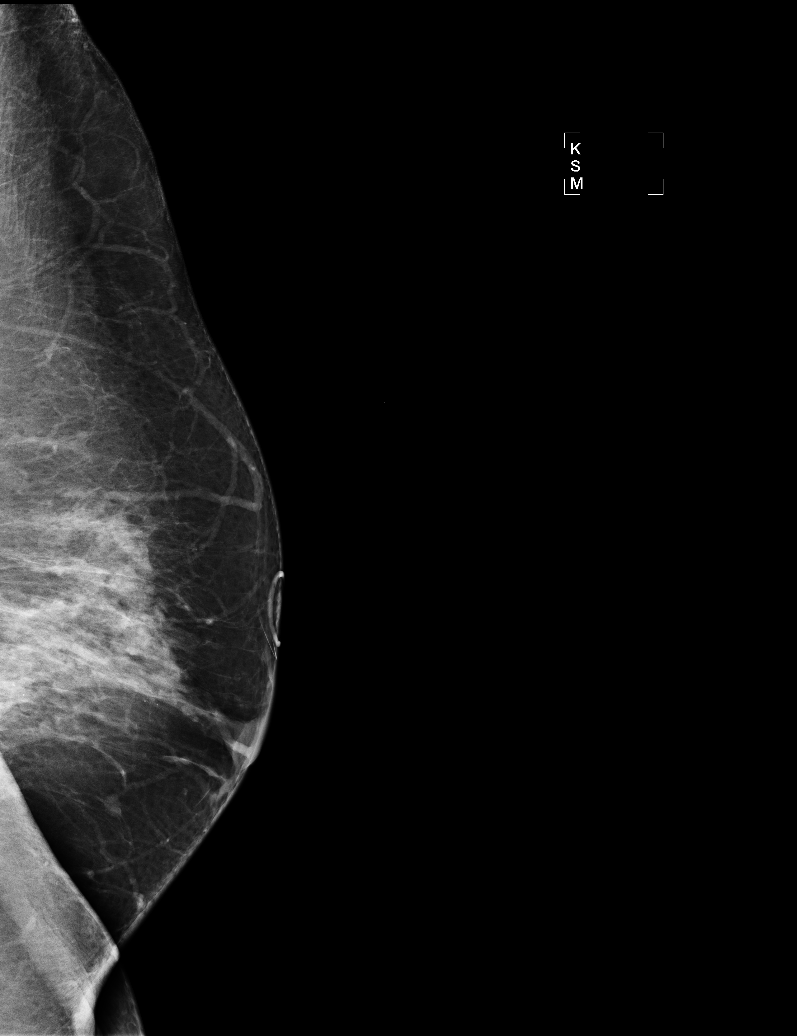

[5 of 5 positions shown; findings below may reference images not displayed]

IMPRESSION: No specific mammographic evidence of malignancy.  Next screening mammogram is recommended in one 
year.

A result letter of this screening mammogram will be mailed directly to the patient.

ASSESSMENT: Benign - BI-RADS 2

Screening mammogram in 1 year.
,

## 2011-11-03 ENCOUNTER — Other Ambulatory Visit: Payer: Self-pay | Admitting: Internal Medicine

## 2011-11-03 DIAGNOSIS — Z1231 Encounter for screening mammogram for malignant neoplasm of breast: Secondary | ICD-10-CM

## 2011-11-26 ENCOUNTER — Other Ambulatory Visit: Payer: Self-pay | Admitting: Gastroenterology

## 2011-12-09 ENCOUNTER — Ambulatory Visit
Admission: RE | Admit: 2011-12-09 | Discharge: 2011-12-09 | Disposition: A | Discharge status: Home / Self Care | Payer: Medicare Other | Source: Ambulatory Visit | Attending: Internal Medicine | Admitting: Internal Medicine

## 2011-12-09 DIAGNOSIS — Z1231 Encounter for screening mammogram for malignant neoplasm of breast: Secondary | ICD-10-CM

## 2013-10-03 ENCOUNTER — Other Ambulatory Visit: Payer: Self-pay

## 2013-10-03 DIAGNOSIS — Z1231 Encounter for screening mammogram for malignant neoplasm of breast: Secondary | ICD-10-CM

## 2013-10-20 ENCOUNTER — Ambulatory Visit
Admission: RE | Admit: 2013-10-20 | Discharge: 2013-10-20 | Disposition: A | Discharge status: Home / Self Care | Payer: Medicare HMO | Source: Ambulatory Visit

## 2013-10-20 DIAGNOSIS — Z1231 Encounter for screening mammogram for malignant neoplasm of breast: Secondary | ICD-10-CM

## 2014-01-12 HISTORY — PX: EYE SURGERY: SHX253

## 2014-03-09 IMAGING — MG MM DIGITAL SCREENING BILAT
8 series · 9 of 24 positions shown · non-contrast
Comparison: Previous exams.

CLINICAL DATA: Screening.

DIGITAL BILATERAL SCREENING MAMMOGRAM WITH CAD
DIGITAL BREAST TOMOSYNTHESIS
Digital breast tomosynthesis images are acquired in two
projections.  These images are reviewed in combination with the
digital mammogram, confirming the findings below.

[L CC]
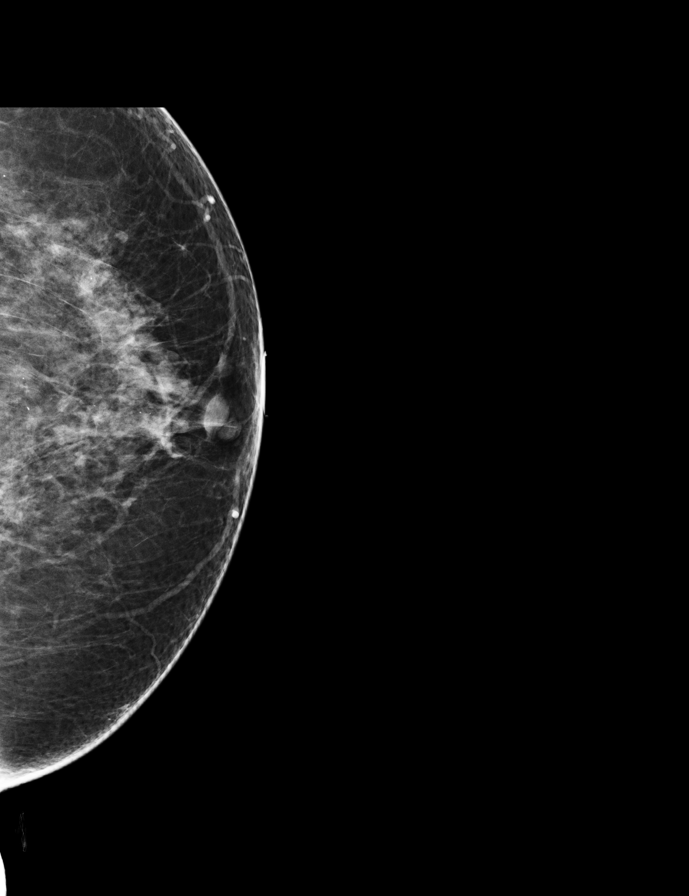

[R CC]
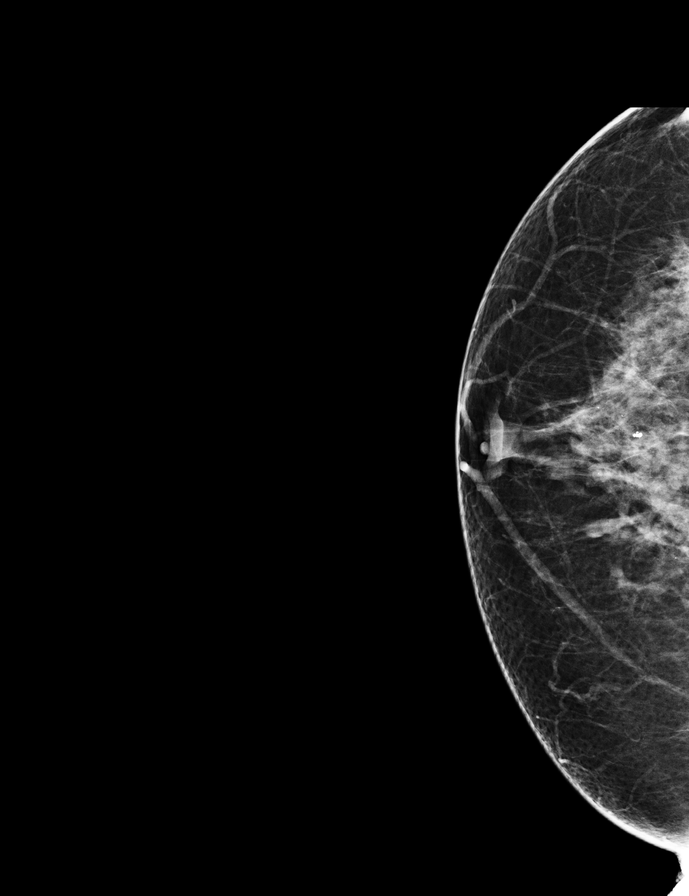

[R MLO]
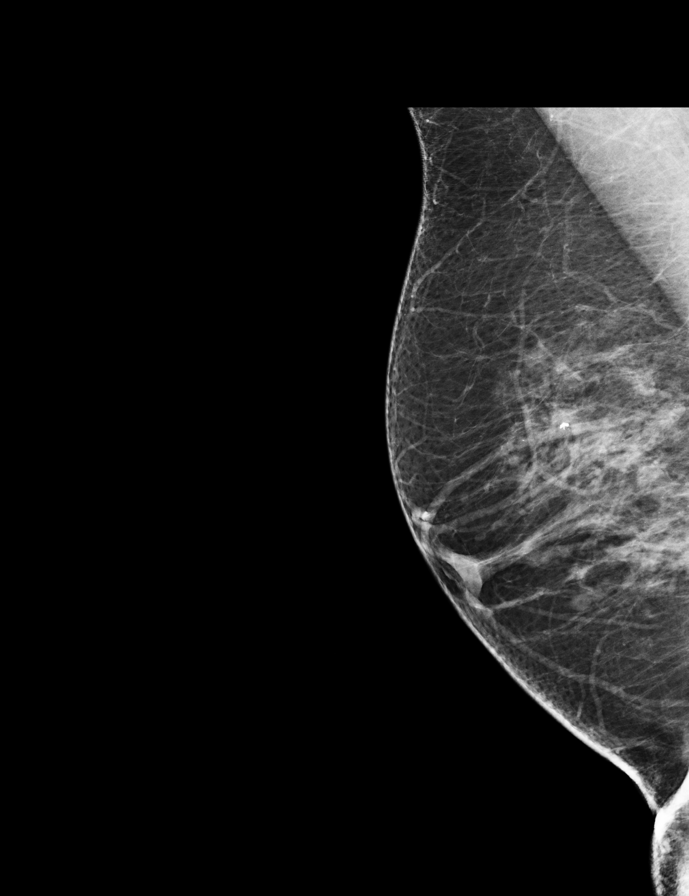

[L MLO]
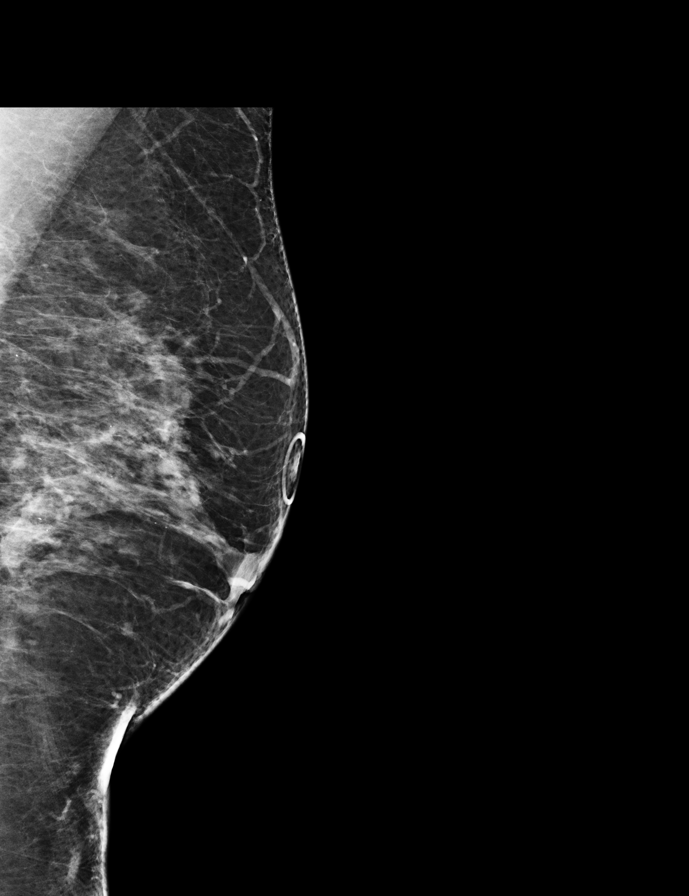

[LCC COMBO BREAST TOMOSYNTHESIS IMAGE tomo · 2 of 66 frames shown]
[frame 22/66]
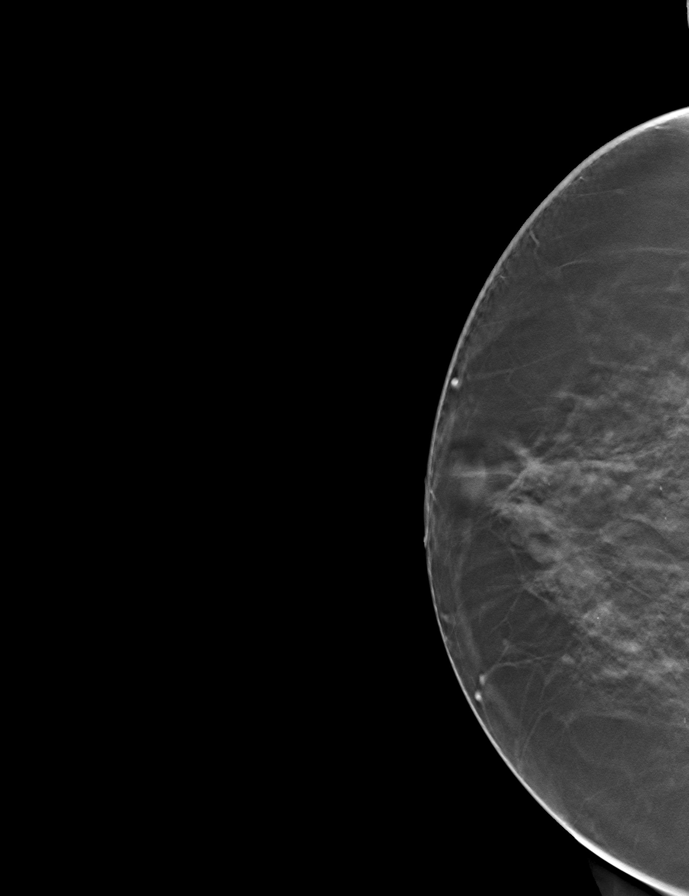
[frame 33/66]
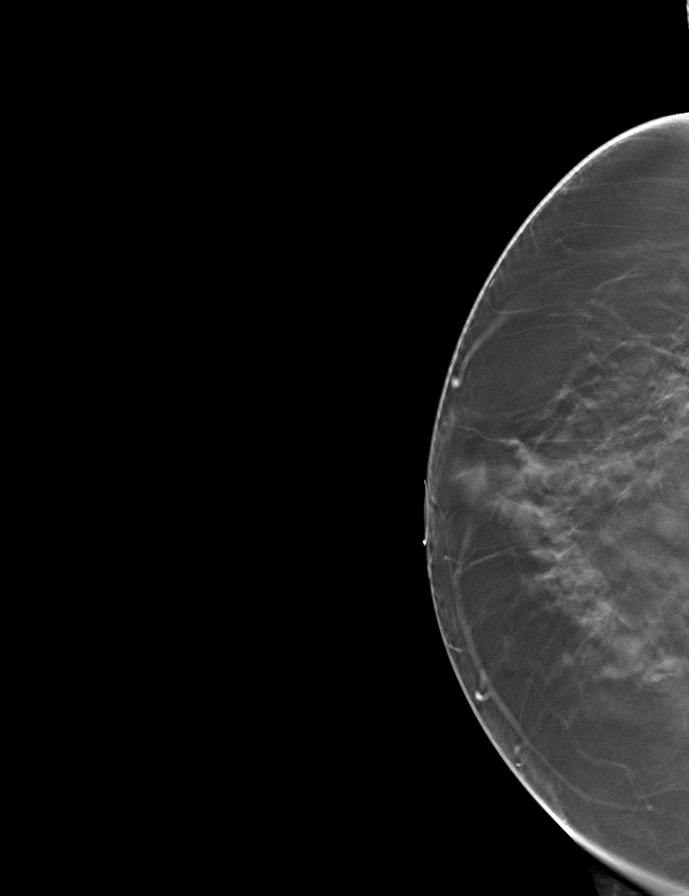

[RCC COMBO BREAST TOMOSYNTHESIS IMAGE tomo · tomo slice 30/59.0]
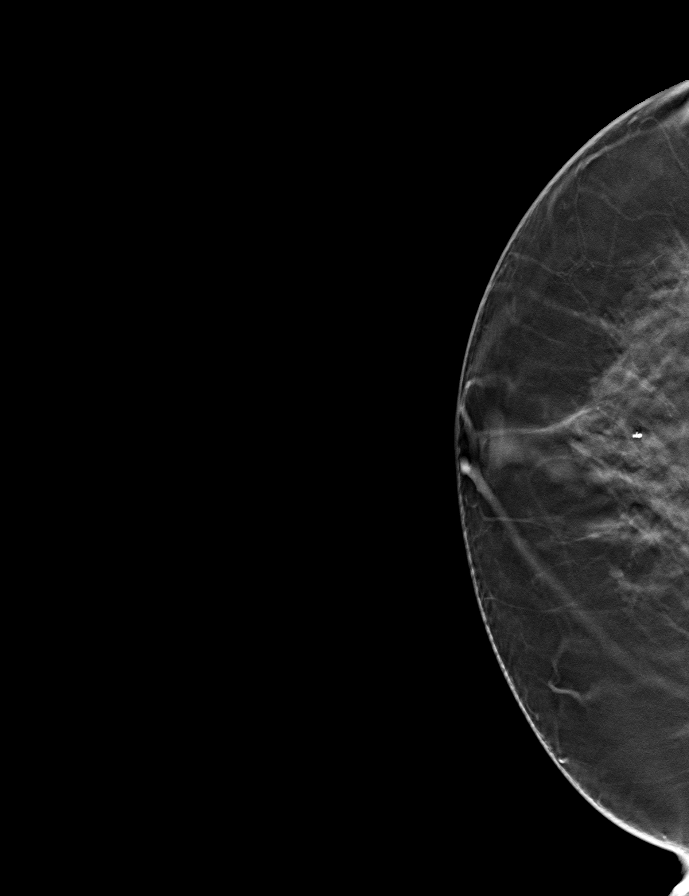

[RMLO COMBO BREAST TOMOSYNTHESIS IMAGE tomo · tomo slice 35/68.0]
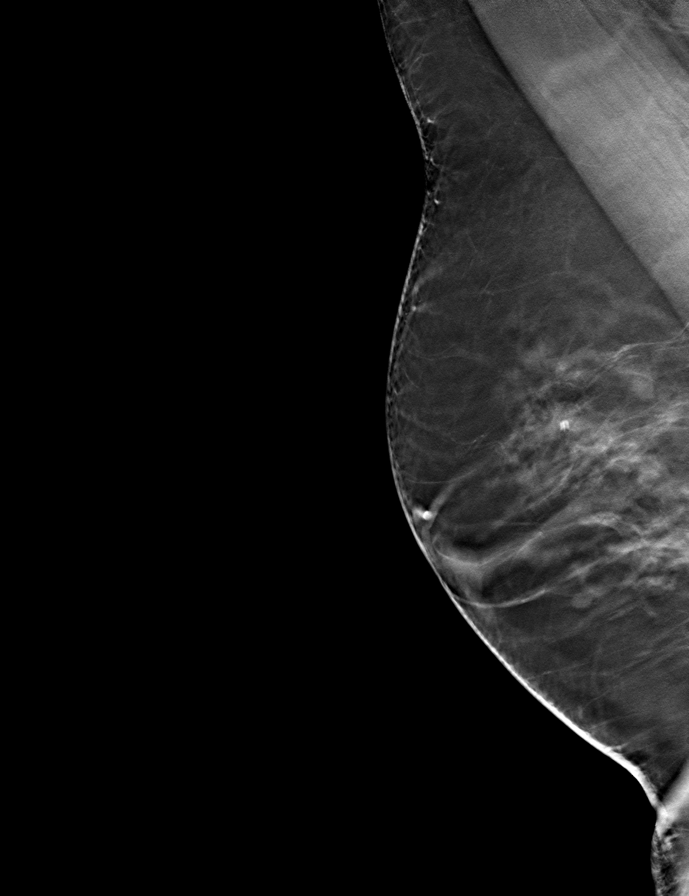

[LMLO COMBO BREAST TOMOSYNTHESIS IMAGE tomo · tomo slice 32/63.0]
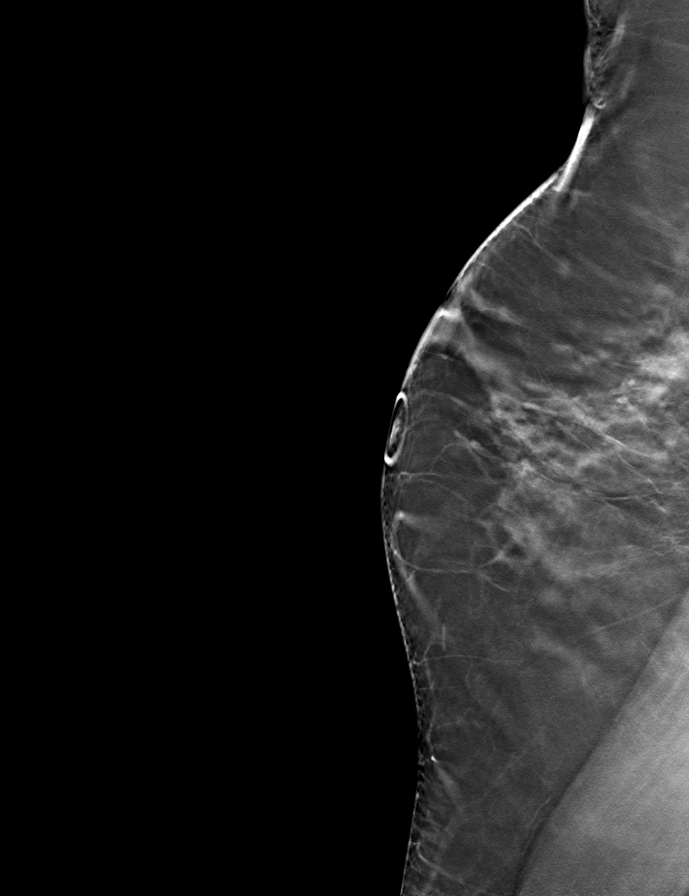

[9 of 24 positions shown; findings below may reference images not displayed]

FINDINGS: The breast tissue is heterogeneously dense. No
suspicious masses, architectural distortion, or calcifications are
present.

Images were processed with CAD.
IMPRESSION: No mammographic evidence of malignancy.

A result letter of this screening mammogram will be mailed directly
to the patient.

RECOMMENDATION:
Screening mammogram in one year. (Code:98-8-1FJ)

BI-RADS CATEGORY 1:  Negative.

## 2014-10-16 DIAGNOSIS — J3089 Other allergic rhinitis: Secondary | ICD-10-CM | POA: Diagnosis not present

## 2014-10-16 DIAGNOSIS — J301 Allergic rhinitis due to pollen: Secondary | ICD-10-CM | POA: Diagnosis not present

## 2014-10-18 DIAGNOSIS — Z0000 Encounter for general adult medical examination without abnormal findings: Secondary | ICD-10-CM | POA: Insufficient documentation

## 2014-10-18 DIAGNOSIS — I10 Essential (primary) hypertension: Secondary | ICD-10-CM | POA: Diagnosis not present

## 2014-10-18 DIAGNOSIS — E785 Hyperlipidemia, unspecified: Secondary | ICD-10-CM | POA: Diagnosis not present

## 2014-10-25 DIAGNOSIS — Z0000 Encounter for general adult medical examination without abnormal findings: Secondary | ICD-10-CM | POA: Diagnosis not present

## 2014-10-25 DIAGNOSIS — I10 Essential (primary) hypertension: Secondary | ICD-10-CM | POA: Diagnosis not present

## 2014-10-25 DIAGNOSIS — R1901 Right upper quadrant abdominal swelling, mass and lump: Secondary | ICD-10-CM | POA: Diagnosis not present

## 2014-10-25 DIAGNOSIS — M1990 Unspecified osteoarthritis, unspecified site: Secondary | ICD-10-CM | POA: Diagnosis not present

## 2014-10-25 DIAGNOSIS — K219 Gastro-esophageal reflux disease without esophagitis: Secondary | ICD-10-CM | POA: Diagnosis not present

## 2014-10-25 DIAGNOSIS — Z1212 Encounter for screening for malignant neoplasm of rectum: Secondary | ICD-10-CM | POA: Diagnosis not present

## 2014-10-25 DIAGNOSIS — H9192 Unspecified hearing loss, left ear: Secondary | ICD-10-CM | POA: Diagnosis not present

## 2014-10-25 DIAGNOSIS — J45909 Unspecified asthma, uncomplicated: Secondary | ICD-10-CM | POA: Diagnosis not present

## 2014-10-25 DIAGNOSIS — R413 Other amnesia: Secondary | ICD-10-CM | POA: Diagnosis not present

## 2014-10-25 DIAGNOSIS — B86 Scabies: Secondary | ICD-10-CM | POA: Diagnosis not present

## 2014-10-25 DIAGNOSIS — E785 Hyperlipidemia, unspecified: Secondary | ICD-10-CM | POA: Diagnosis not present

## 2014-10-30 DIAGNOSIS — J301 Allergic rhinitis due to pollen: Secondary | ICD-10-CM | POA: Diagnosis not present

## 2014-10-30 DIAGNOSIS — J3089 Other allergic rhinitis: Secondary | ICD-10-CM | POA: Diagnosis not present

## 2014-11-09 DIAGNOSIS — J301 Allergic rhinitis due to pollen: Secondary | ICD-10-CM | POA: Diagnosis not present

## 2014-11-09 DIAGNOSIS — J3089 Other allergic rhinitis: Secondary | ICD-10-CM | POA: Diagnosis not present

## 2014-11-13 DIAGNOSIS — H524 Presbyopia: Secondary | ICD-10-CM | POA: Diagnosis not present

## 2014-11-22 ENCOUNTER — Other Ambulatory Visit: Payer: Self-pay | Admitting: Internal Medicine

## 2014-11-22 DIAGNOSIS — J3089 Other allergic rhinitis: Secondary | ICD-10-CM | POA: Diagnosis not present

## 2014-11-22 DIAGNOSIS — J301 Allergic rhinitis due to pollen: Secondary | ICD-10-CM | POA: Diagnosis not present

## 2014-11-22 DIAGNOSIS — Z780 Asymptomatic menopausal state: Secondary | ICD-10-CM | POA: Diagnosis not present

## 2014-11-22 DIAGNOSIS — R1901 Right upper quadrant abdominal swelling, mass and lump: Secondary | ICD-10-CM

## 2014-11-22 DIAGNOSIS — Z6834 Body mass index (BMI) 34.0-34.9, adult: Secondary | ICD-10-CM | POA: Diagnosis not present

## 2014-11-22 DIAGNOSIS — B86 Scabies: Secondary | ICD-10-CM | POA: Diagnosis not present

## 2014-11-22 DIAGNOSIS — I10 Essential (primary) hypertension: Secondary | ICD-10-CM | POA: Diagnosis not present

## 2014-11-23 ENCOUNTER — Other Ambulatory Visit: Payer: Self-pay

## 2014-11-23 DIAGNOSIS — Z1231 Encounter for screening mammogram for malignant neoplasm of breast: Secondary | ICD-10-CM

## 2014-11-27 DIAGNOSIS — J301 Allergic rhinitis due to pollen: Secondary | ICD-10-CM | POA: Diagnosis not present

## 2014-11-27 DIAGNOSIS — J3089 Other allergic rhinitis: Secondary | ICD-10-CM | POA: Diagnosis not present

## 2014-11-29 DIAGNOSIS — J301 Allergic rhinitis due to pollen: Secondary | ICD-10-CM | POA: Diagnosis not present

## 2014-11-29 DIAGNOSIS — J3089 Other allergic rhinitis: Secondary | ICD-10-CM | POA: Diagnosis not present

## 2014-11-29 DIAGNOSIS — H903 Sensorineural hearing loss, bilateral: Secondary | ICD-10-CM | POA: Diagnosis not present

## 2014-11-30 ENCOUNTER — Ambulatory Visit
Admission: RE | Admit: 2014-11-30 | Discharge: 2014-11-30 | Disposition: A | Discharge status: Home / Self Care | Payer: Medicare HMO | Source: Ambulatory Visit | Attending: Internal Medicine | Admitting: Internal Medicine

## 2014-11-30 DIAGNOSIS — N281 Cyst of kidney, acquired: Secondary | ICD-10-CM | POA: Diagnosis not present

## 2014-11-30 DIAGNOSIS — R1901 Right upper quadrant abdominal swelling, mass and lump: Secondary | ICD-10-CM

## 2014-11-30 MED ORDER — IOPAMIDOL (ISOVUE-300) INJECTION 61%
100.0000 mL | Freq: Once | INTRAVENOUS | Status: AC | PRN
  Administered 2014-11-30: 100 mL via INTRAVENOUS

## 2014-12-03 DIAGNOSIS — J3089 Other allergic rhinitis: Secondary | ICD-10-CM | POA: Diagnosis not present

## 2014-12-03 DIAGNOSIS — J301 Allergic rhinitis due to pollen: Secondary | ICD-10-CM | POA: Diagnosis not present

## 2014-12-05 DIAGNOSIS — J301 Allergic rhinitis due to pollen: Secondary | ICD-10-CM | POA: Diagnosis not present

## 2014-12-05 DIAGNOSIS — J3089 Other allergic rhinitis: Secondary | ICD-10-CM | POA: Diagnosis not present

## 2014-12-12 ENCOUNTER — Other Ambulatory Visit: Payer: Self-pay | Admitting: Urology

## 2014-12-12 ENCOUNTER — Ambulatory Visit (HOSPITAL_COMMUNITY)
Admission: RE | Admit: 2014-12-12 | Discharge: 2014-12-12 | Disposition: A | Discharge status: Home / Self Care | Payer: Medicare HMO | Source: Ambulatory Visit | Attending: Urology | Admitting: Urology

## 2014-12-12 DIAGNOSIS — D4102 Neoplasm of uncertain behavior of left kidney: Secondary | ICD-10-CM | POA: Insufficient documentation

## 2014-12-12 DIAGNOSIS — C649 Malignant neoplasm of unspecified kidney, except renal pelvis: Secondary | ICD-10-CM | POA: Diagnosis not present

## 2014-12-12 DIAGNOSIS — I517 Cardiomegaly: Secondary | ICD-10-CM | POA: Diagnosis not present

## 2014-12-12 DIAGNOSIS — J3089 Other allergic rhinitis: Secondary | ICD-10-CM | POA: Diagnosis not present

## 2014-12-12 DIAGNOSIS — J301 Allergic rhinitis due to pollen: Secondary | ICD-10-CM | POA: Diagnosis not present

## 2014-12-21 DIAGNOSIS — J301 Allergic rhinitis due to pollen: Secondary | ICD-10-CM | POA: Diagnosis not present

## 2014-12-21 DIAGNOSIS — J3089 Other allergic rhinitis: Secondary | ICD-10-CM | POA: Diagnosis not present

## 2014-12-26 ENCOUNTER — Ambulatory Visit
Admission: RE | Admit: 2014-12-26 | Discharge: 2014-12-26 | Disposition: A | Discharge status: Home / Self Care | Payer: Medicare HMO | Source: Ambulatory Visit

## 2014-12-26 DIAGNOSIS — J301 Allergic rhinitis due to pollen: Secondary | ICD-10-CM | POA: Diagnosis not present

## 2014-12-26 DIAGNOSIS — Z1231 Encounter for screening mammogram for malignant neoplasm of breast: Secondary | ICD-10-CM | POA: Diagnosis not present

## 2014-12-26 DIAGNOSIS — J3089 Other allergic rhinitis: Secondary | ICD-10-CM | POA: Diagnosis not present

## 2015-01-01 ENCOUNTER — Other Ambulatory Visit (HOSPITAL_COMMUNITY): Payer: Self-pay | Admitting: Urology

## 2015-01-01 DIAGNOSIS — D49512 Neoplasm of unspecified behavior of left kidney: Secondary | ICD-10-CM

## 2015-01-01 DIAGNOSIS — J301 Allergic rhinitis due to pollen: Secondary | ICD-10-CM | POA: Diagnosis not present

## 2015-01-01 DIAGNOSIS — J3089 Other allergic rhinitis: Secondary | ICD-10-CM | POA: Diagnosis not present

## 2015-01-03 ENCOUNTER — Other Ambulatory Visit: Payer: Self-pay | Admitting: Urology

## 2015-01-10 ENCOUNTER — Ambulatory Visit (HOSPITAL_COMMUNITY)
Admission: RE | Admit: 2015-01-10 | Discharge: 2015-01-10 | Disposition: A | Discharge status: Home / Self Care | Payer: Medicare HMO | Source: Ambulatory Visit | Attending: Urology | Admitting: Urology

## 2015-01-10 DIAGNOSIS — K760 Fatty (change of) liver, not elsewhere classified: Secondary | ICD-10-CM | POA: Diagnosis not present

## 2015-01-10 DIAGNOSIS — Z9049 Acquired absence of other specified parts of digestive tract: Secondary | ICD-10-CM | POA: Diagnosis not present

## 2015-01-10 DIAGNOSIS — D49512 Neoplasm of unspecified behavior of left kidney: Secondary | ICD-10-CM

## 2015-01-10 DIAGNOSIS — K449 Diaphragmatic hernia without obstruction or gangrene: Secondary | ICD-10-CM | POA: Diagnosis not present

## 2015-01-10 DIAGNOSIS — N2889 Other specified disorders of kidney and ureter: Secondary | ICD-10-CM | POA: Insufficient documentation

## 2015-01-10 DIAGNOSIS — K5730 Diverticulosis of large intestine without perforation or abscess without bleeding: Secondary | ICD-10-CM | POA: Insufficient documentation

## 2015-01-10 DIAGNOSIS — N289 Disorder of kidney and ureter, unspecified: Secondary | ICD-10-CM | POA: Diagnosis not present

## 2015-01-10 LAB — POCT I-STAT CREATININE: Creatinine, Ser: 0.8 mg/dL (ref 0.44–1.00)

## 2015-01-10 MED ORDER — GADOBENATE DIMEGLUMINE 529 MG/ML IV SOLN
15.0000 mL | Freq: Once | INTRAVENOUS | Status: AC | PRN
  Administered 2015-01-10: 15 mL via INTRAVENOUS

## 2015-01-11 DIAGNOSIS — J3089 Other allergic rhinitis: Secondary | ICD-10-CM | POA: Diagnosis not present

## 2015-01-11 DIAGNOSIS — J301 Allergic rhinitis due to pollen: Secondary | ICD-10-CM | POA: Diagnosis not present

## 2015-01-17 ENCOUNTER — Ambulatory Visit (HOSPITAL_COMMUNITY): Payer: Medicare HMO

## 2015-01-18 DIAGNOSIS — J3089 Other allergic rhinitis: Secondary | ICD-10-CM | POA: Diagnosis not present

## 2015-01-18 DIAGNOSIS — J301 Allergic rhinitis due to pollen: Secondary | ICD-10-CM | POA: Diagnosis not present

## 2015-01-29 NOTE — Patient Instructions (Addendum)
[name]  01/29/2015   Your procedure is scheduled on:   Monday 02/04/2015  Report to [facility] Main  Entrance take [place]  elevators to 3rd floor to  [place] at  [place] AM.  Call this number if you have problems the morning of surgery [phone]   Remember: ONLY 1 PERSON MAY GO WITH YOU TO SHORT STAY TO GET  READY MORNING OF [name].     CLEAR LIQUID DIET   Foods Allowed                                                                     Foods Excluded  Coffee and tea, regular and decaf                             liquids that you cannot  Plain Jell-O in any flavor                                             see through such as: Fruit ices (not with fruit pulp)                                     milk, soups, orange juice  Iced Popsicles                                    All solid food Carbonated beverages, regular and diet                                    Cranberry, grape and apple juices Sports drinks like Gatorade Lightly seasoned clear broth or consume(fat free) Sugar, honey syrup  Sample Menu Breakfast                                Lunch                                     Supper Cranberry juice                    Beef broth                            Chicken broth Jell-O                                     Grape juice                           Apple  juice Coffee or tea                        Jell-O                                      Popsicle                                                Coffee or tea                        Coffee or tea  _____________________________________________________________________   Do not eat food or drink liquids :After Midnight.               FOLLOW BOWEL PREP INSTRUCTIONS FROM DR. BORDEN'S OFFICE WITH A CLEAR LIQUID DIET! SEE LIST BELOW!    Take these medicines the morning of surgery with A SIP OF WATER: Metoprolol, use Albuterol inhaler and Advair diskus if needed                                  You may not have any metal on your body including hair pins and              piercings  Do not wear jewelry, make-up, lotions, powders or perfumes, deodorant             Do not wear nail polish.  Do not shave  48 hours prior to surgery.              Men may shave face and neck.   Do not bring valuables to the hospital. [place].  Contacts, dentures or bridgework may not be worn into surgery.  Leave suitcase in the car. After surgery it may be brought to your room.     Patients discharged the day of surgery will not be allowed to drive home.  Name and phone number of your driver:  Special Instructions: N/A              Please read over the following fact sheets you were given: _____________________________________________________________________             [facility] - Preparing for Surgery Before surgery, you can play an important role.  Because skin is not sterile, your skin needs to be as free of germs as possible.  You can reduce the number of germs on your skin by washing with CHG (chlorahexidine gluconate) soap before surgery.  CHG is an antiseptic cleaner which kills germs and bonds with the skin to continue killing germs even after washing. Please DO NOT use if you have an allergy to CHG or antibacterial soaps.  If your skin becomes reddened/irritated stop using the CHG and inform your nurse when you arrive at Short Stay. Do not shave (including legs and underarms) for at least 48 hours prior to the first CHG shower.  You may shave your face/neck. Please follow these instructions carefully:  1.  Shower with CHG Soap the night before surgery and the  morning of Surgery.  2.  If you choose to wash your hair, wash your hair first as usual with your  normal  shampoo.  3.  After you shampoo, rinse your hair and body thoroughly to remove the  shampoo.                           4.  Use CHG as you would any other liquid  soap.  You can apply chg directly  to the skin and wash                       Gently with a scrungie or clean washcloth.  5.  Apply the CHG Soap to your body ONLY FROM THE NECK DOWN.   Do not use on face/ open                           Wound or open sores. Avoid contact with eyes, ears mouth and genitals (private parts).                       Wash face,  Genitals (private parts) with your normal soap.             6.  Wash thoroughly, paying special attention to the area where your surgery  will be performed.  7.  Thoroughly rinse your body with warm water from the neck down.  8.  DO NOT shower/wash with your normal soap after using and rinsing off  the CHG Soap.                9.  Pat yourself dry with a clean towel.            10.  Wear clean pajamas.            11.  Place clean sheets on your bed the night of your first shower and do not  sleep with pets. Day of Surgery : Do not apply any lotions/deodorants the morning of surgery.  Please wear clean clothes to the hospital/surgery center.  FAILURE TO FOLLOW THESE INSTRUCTIONS MAY RESULT IN THE CANCELLATION OF YOUR SURGERY PATIENT SIGNATURE_________________________________  NURSE SIGNATURE__________________________________  ________________________________________________________________________   [name]  An incentive spirometer is a tool that can help keep your lungs clear and active. This tool measures how well you are filling your lungs with each breath. Taking long deep breaths may help reverse or decrease the chance of developing breathing (pulmonary) problems (especially infection) following:  A long period of time when you are unable to move or be active. BEFORE THE PROCEDURE   If the spirometer includes an indicator to show your best effort, your nurse or respiratory therapist will set it to a desired goal.  If possible, sit up straight or lean slightly forward. Try not to slouch.  Hold the incentive spirometer  in an upright position. INSTRUCTIONS FOR USE   Sit on the edge of your bed if possible, or sit up as far as you can in bed or on a chair.  Hold the incentive spirometer in an upright position.  Breathe out normally.  Place the mouthpiece in your mouth and seal your lips tightly around it.  Breathe in slowly and as deeply as possible, raising the piston or the ball toward the top of the column.  Hold your breath for 3-5 seconds or for as long as possible. Allow the piston  or ball to fall to the bottom of the column.  Remove the mouthpiece from your mouth and breathe out normally.  Rest for a few seconds and repeat Steps 1 through 7 at least 10 times every 1-2 hours when you are awake. Take your time and take a few normal breaths between deep breaths.  The spirometer may include an indicator to show your best effort. Use the indicator as a goal to work toward during each repetition.  After each set of 10 deep breaths, practice coughing to be sure your lungs are clear. If you have an incision (the cut made at the time of surgery), support your incision when coughing by placing a pillow or rolled up towels firmly against it. Once you are able to get out of bed, walk around indoors and cough well. You may stop using the incentive spirometer when instructed by your caregiver.  RISKS AND COMPLICATIONS  Take your time so you do not get dizzy or light-headed.  If you are in pain, you may need to take or ask for pain medication before doing incentive spirometry. It is harder to take a deep breath if you are having pain. AFTER USE  Rest and breathe slowly and easily.  It can be helpful to keep track of a log of your progress. Your caregiver can provide you with a simple table to help with this. If you are using the spirometer at home, follow these instructions: [place] IF:   You are having difficultly using the spirometer.  You have trouble using the spirometer as often as  instructed.  Your pain medication is not giving enough relief while using the spirometer.  You develop fever of 100.5 F (38.1 C) or higher. SEEK IMMEDIATE MEDICAL CARE IF:   You cough up bloody sputum that had not been present before.  You develop fever of 102 F (38.9 C) or greater.  You develop worsening pain at or near the incision site. MAKE SURE YOU:   Understand these instructions.  Will watch your condition.  Will get help right away if you are not doing well or get worse. Document Released: 05/11/2006 Document Revised: 03/23/2011 Document Reviewed: 07/12/2006 Exit[employer] Patient Information 2014 [employer], [place].   ________________________________________________________________________  WHAT IS A BLOOD TRANSFUSION? Blood Transfusion Information  A transfusion is the replacement of blood or some of its parts. Blood is made up of multiple cells which provide different functions.  Red blood cells carry oxygen and are used for blood loss replacement.  White blood cells fight against infection.  Platelets control bleeding.  Plasma helps clot blood.  Other blood products are available for specialized needs, such as hemophilia or other clotting disorders. BEFORE THE TRANSFUSION  Who gives blood for transfusions?   Healthy volunteers who are fully evaluated to make sure their blood is safe. This is blood bank blood. Transfusion therapy is the safest it has ever been in the practice of medicine. Before blood is taken from a donor, a complete history is taken to make sure that person has no history of diseases nor engages in risky social behavior (examples are intravenous drug use or sexual activity with multiple partners). The donor's travel history is screened to minimize risk of transmitting infections, such as malaria. The donated blood is tested for signs of infectious diseases, such as HIV and hepatitis. The blood is then tested to be sure it is compatible with you in  order to minimize the chance of a transfusion reaction. If you  or a relative donates blood, this is often done in anticipation of surgery and is not appropriate for emergency situations. It takes many days to process the donated blood. RISKS AND COMPLICATIONS Although transfusion therapy is very safe and saves many lives, the main dangers of transfusion include:   Getting an infectious disease.  Developing a transfusion reaction. This is an allergic reaction to something in the blood you were given. Every precaution is taken to prevent this. The decision to have a blood transfusion has been considered carefully by your caregiver before blood is given. Blood is not given unless the benefits outweigh the risks. AFTER THE TRANSFUSION  Right after receiving a blood transfusion, you will usually feel much better and more energetic. This is especially true if your red blood cells have gotten low (anemic). The transfusion raises the level of the red blood cells which carry oxygen, and this usually causes an energy increase.  The nurse administering the transfusion will monitor you carefully for complications. HOME CARE INSTRUCTIONS  No special instructions are needed after a transfusion. You may find your energy is better. Speak with your caregiver about any limitations on activity for underlying diseases you may have. SEEK MEDICAL CARE IF:   Your condition is not improving after your transfusion.  You develop redness or irritation at the intravenous (IV) site. SEEK IMMEDIATE MEDICAL CARE IF:  Any of the following symptoms occur over the next 12 hours:  Shaking chills.  You have a temperature by mouth above 102 F (38.9 C), not controlled by medicine.  Chest, back, or muscle pain.  People around you feel you are not acting correctly or are confused.  Shortness of breath or difficulty breathing.  Dizziness and fainting.  You get a rash or develop hives.  You have a decrease in urine  output.  Your urine turns a dark color or changes to pink, red, or brown. Any of the following symptoms occur over the next 10 days:  You have a temperature by mouth above 102 F (38.9 C), not controlled by medicine.  Shortness of breath.  Weakness after normal activity.  The white part of the eye turns yellow (jaundice).  You have a decrease in the amount of urine or are urinating less often.  Your urine turns a dark color or changes to pink, red, or brown. Document Released: 12/27/1999 Document Revised: 03/23/2011 Document Reviewed: 08/15/2007 [facility] Patient Information 2014 [place], [place].  _______________________________________________________________________

## 2015-01-30 ENCOUNTER — Encounter (HOSPITAL_COMMUNITY): Payer: Self-pay

## 2015-01-30 ENCOUNTER — Encounter (HOSPITAL_COMMUNITY)
Admission: RE | Admit: 2015-01-30 | Discharge: 2015-01-30 | Disposition: A | Discharge status: Home / Self Care | Payer: Medicare HMO | Source: Ambulatory Visit | Attending: Urology | Admitting: Urology

## 2015-01-30 DIAGNOSIS — Z01812 Encounter for preprocedural laboratory examination: Secondary | ICD-10-CM | POA: Insufficient documentation

## 2015-01-30 DIAGNOSIS — Z01810 Encounter for preprocedural cardiovascular examination: Secondary | ICD-10-CM | POA: Diagnosis not present

## 2015-01-30 HISTORY — DX: Unspecified hearing loss, left ear: H91.92

## 2015-01-30 HISTORY — DX: Gastro-esophageal reflux disease without esophagitis: K21.9

## 2015-01-30 HISTORY — DX: Unspecified asthma, uncomplicated: J45.909

## 2015-01-30 HISTORY — DX: Pneumonia, unspecified organism: J18.9

## 2015-01-30 HISTORY — DX: Essential (primary) hypertension: I10

## 2015-01-30 LAB — CBC
HEMATOCRIT: 45.5 % (ref 36.0–46.0)
HEMOGLOBIN: 14.3 g/dL (ref 12.0–15.0)
MCH: 26.4 pg (ref 26.0–34.0)
MCHC: 31.4 g/dL (ref 30.0–36.0)
MCV: 84.1 fL (ref 78.0–100.0)
Platelets: 268 10*3/uL (ref 150–400)
RBC: 5.41 MIL/uL — AB (ref 3.87–5.11)
RDW: 14.6 % (ref 11.5–15.5)
WBC: 9.5 10*3/uL (ref 4.0–10.5)

## 2015-01-30 LAB — BASIC METABOLIC PANEL
ANION GAP: 10 (ref 5–15)
BUN: 19 mg/dL (ref 6–20)
CHLORIDE: 106 mmol/L (ref 101–111)
CO2: 25 mmol/L (ref 22–32)
CREATININE: 0.77 mg/dL (ref 0.44–1.00)
Calcium: 9.9 mg/dL (ref 8.9–10.3)
GFR calc Af Amer: >60 mL/min (ref >60)
GFR calc non Af Amer: >60 mL/min (ref >60)
Glucose, Bld: 85 mg/dL (ref 65–99)
POTASSIUM: 4.1 mmol/L (ref 3.5–5.1)
SODIUM: 141 mmol/L (ref 135–145)

## 2015-01-30 LAB — ABO/RH: ABO/RH(D): O POS

## 2015-02-01 NOTE — H&P (Signed)
Chief Complaint Left renal neoplasm   Reason For Visit Reason for consult: To discuss treatment options for her left renal mass.  Physician requesting consult: Dr. Eda Keys  PCP: Dr. Berneta Sages   History of Present Illness Ms. Derden is a 70 year old lady who was noted to have an abdominal mass during a physical exam by Dr. Dagmar Hait. 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 No precontrast images were obtained. She has denied hematuria. She has no family history of kidney cancer. Her CT scan demonstrates no evidence of regional lymphadenopathy, contralateral renal masses, adrenal lesions, or other evidence metastatic disease to the abdomen. Her CXR is negative for metastatic disease. Her serum creatinine on 12/03/14 was 0.93. LFTs and alkaline phosphatase were normal.     ** He PMH is significant for hypertension, GERD, and asthma. Her asthma is well controlled. She does receive allergy shots weekly. She has undergone a laparoscopic cholecystectomy in 1997 and an open hysterectomy via a Pfannenstiel incision in 1999.     She denies a history of gross hematuria, family history of kidney cancer, or history of tobacco use.   Past Medical History Problems  1. History of asthma (Z87.09) 2. History of esophageal reflux (Z87.19) 3. History of hypertension (Z86.79)  Surgical History Problems  1. History of Cholecystectomy 2. History of Hysterectomy  Current Meds 1. Advair HFA AERO;  Therapy: (Recorded:30Nov2016) to Recorded 2. Aspirin 81 MG TABS;  Therapy: (Recorded:30Nov2016) to Recorded 3. Irbesartan-Hydrochlorothiazide 300-12.5 MG Oral Tablet;  Therapy: (Recorded:30Nov2016) to Recorded 4. Metoprolol Tartrate 50 MG Oral Tablet;  Therapy: (Recorded:30Nov2016) to Recorded 5. Montelukast Sodium TABS;  Therapy: (Recorded:30Nov2016) to Recorded 6. ProAir HFA AERS;  Therapy: (Recorded:30Nov2016) to Recorded 7. Ranitidine TABS;  Therapy: (Recorded:30Nov2016) to Recorded  Allergies Medication  1. codeine  Family History Problems  1. Family history of Colon cancer : Mother 2. Family history of Death of family member : Father 51. Family history of malignant neoplasm of breast (Z80.3) : Mother, Sister 4. Family history of myocardial infarction (Z82.49) : Father  Social History Problems    Denied: History of Alcohol use   Caffeine use (F15.90)   2 per day   Married   Never a smoker   Number of children   1 son, 1 daughter  Review of Systems Genitourinary, constitutional, skin, eye, otolaryngeal, hematologic/lymphatic, cardiovascular, pulmonary, endocrine, musculoskeletal, gastrointestinal, neurological and psychiatric system(s) were reviewed and pertinent findings if present are noted and are otherwise negative.  Genitourinary: no hematuria.  Hematologic/Lymphatic: no swollen glands.  Cardiovascular: no chest pain and no leg swelling.  Respiratory: no shortness of breath and no cough.    Vitals Vital Signs [Data Includes: Last 1 Day]  Recorded: 20Dec2016 08:58AM  Weight: 175 lb  BMI Calculated: 34.18 BSA Calculated: 1.76 Blood Pressure: 171 / 81 Heart Rate: 53  Physical Exam Constitutional: Well nourished and well developed . No acute distress.  ENT:. The ears and nose are normal in appearance.  Neck: The appearance of the neck is normal and no neck mass is present.  Pulmonary: No respiratory distress, normal respiratory rhythm and effort and clear bilateral breath sounds.  Cardiovascular: Heart rate and rhythm are normal . No peripheral edema.  Abdomen: The abdomen is soft and nontender. No CVA tenderness. No hernias are palpable. No hepatosplenomegaly noted. She does have a palpable left upper quadrant mass.  Lymphatics: The supraclavicular,  femoral and inguinal nodes are not enlarged or tender.  Skin: Normal skin turgor, no  visible rash and no visible skin lesions.  Neuro/Psych:. Mood and affect are appropriate.    Results/Data Urine [Data Includes: Last 1 Day]   [id number]  COLOR STRAW   APPEARANCE CLEAR   SPECIFIC GRAVITY <1.005   pH 6.0   GLUCOSE NEGATIVE   BILIRUBIN NEGATIVE   KETONE NEGATIVE   BLOOD NEGATIVE   PROTEIN NEGATIVE   NITRITE NEGATIVE   LEUKOCYTE ESTERASE NEGATIVE    I have independently reviewed her CT scan with contrast which demonstrates a large 14 cm complex cystic mass off the anterior and superior aspect of the left kidney overlying the renal hilum. This mass appears multicystic with areas of high density that may represent a possible solid component. No precontrast images are available to compare. There is no regional lymphadenopathy, contralateral renal masses, or other evidence of metastatic disease to the abdomen. No suggestion of renal vein or IVC involvement.    I have also individually reviewed her chest x-ray that is normal without concern for pulmonary masses.   Assessment Assessed  1. Neoplasm of left kidney [id number])  Plan Health Maintenance  1. UA With REFLEX; [Do Not Release]; Status:Complete;   [id number] 08:50AM Neoplasm of left kidney  2. Follow-up Office  Follow-up  Status: Hold For - Appointment,Date of Service  Requested  for: 20Dec2016 3. MRI-KIDNEYS; Status:Hold For - Appointment,PreCert,Print,Records; Requested  for:20Dec2016;   Discussion/Summary 1. Left renal neoplasm concerning for cystic renal malignancy: I recommended that she undergo definitive imaging with pre-and postcontrast MR imaging. She understands that this will better help to determine the risk of malignancy. Based on the complexity of the lesion, I did discuss with her that this lesion likely carries a significant risk for malignancy and probably at least will raise concern for a Bosniak III lesion that would carry at least a 50% risk of malignancy and possibly may be more suspicious  than that. Considering this information, we did discuss the treatment of lesions that carry a significant suspicion for malignancy. We discussed surgical treatment options. Based on the size and location of her lesion, she is not a candidate for nephron sparing surgery. In addition, considering the location of this very large mass, I think she will be best served by an open left radical nephrectomy via a subcostal incision.   The patient was provided information regarding their renal mass including the relative risk of benign versus malignant pathology and the natural history of renal cell carcinoma and other possible malignancies of the kidney. The role of renal biopsy, laboratory testing, and imaging studies to further characterize renal masses and/or the presence of metastatic disease were explained. We discussed the role of active surveillance, surgical therapy with both radical nephrectomy and nephron-sparing surgery, and ablative therapy in the treatment of renal masses. In addition, we discussed our goals of providing an accurate diagnosis and oncologic control while maintaining optimal renal function as appropriate based on the size, location, and complexity of their renal mass as well as their co-morbidities.    We have discussed the risks of treatment in detail including but not limited to bleeding, infection, heart attack, stroke, death, venothromoboembolism, cancer recurrence, injury/damage to surrounding organs and structures, urine leak, the possibility of open surgical conversion for patients undergoing minimally invasive surgery, the risk of developing chronic kidney disease and its associated implications, and the potential risk of end stage renal disease possibly necessitating dialysis.  All questions were answered to her stated satisfaction. She will proceed with an MRI before and after the administration of IV contrast. Our tentative plan is to proceed with a left open radical  nephrectomy pending her MRI results.    Cc: Dr. Berneta Sages  Dr. Festus Aloe  A total of 55 minutes were spent in the overall care of the patient today with 45 minutes in direct face to face consultation.    Amendment  Patient's allergy shots confirmed to be desensitization shots (not 1  1,2  immunosuppressive).    Her MRI was reviewed. This confirms a Bosniak 4 renal cystic lesion. She will proceed with nephrectomy as scheduled. I have discussed this result with her today and answered all questions.2     1 Amended By: Raynelle Bring; Jan 10 2015 8:23 PM EST  2 Amended By: Raynelle Bring; Jan 15 2015 12:44 PM EST  Signatures Electronically signed by : Raynelle Bring, M.D.; Jan 15 2015 12:45PM EST

## 2015-02-04 ENCOUNTER — Encounter (HOSPITAL_COMMUNITY)
Admission: RE | Disposition: A | Discharge status: Home / Self Care | Payer: Self-pay | Source: Ambulatory Visit | Attending: Urology

## 2015-02-04 ENCOUNTER — Inpatient Hospital Stay (HOSPITAL_COMMUNITY)
Admission: RE | Admit: 2015-02-04 | Discharge: 2015-02-07 | DRG: 658 | Disposition: A | Discharge status: Home / Self Care | Payer: Medicare HMO | Source: Ambulatory Visit | Attending: Urology | Admitting: Urology

## 2015-02-04 ENCOUNTER — Inpatient Hospital Stay (HOSPITAL_COMMUNITY): Payer: Medicare HMO | Admitting: Anesthesiology

## 2015-02-04 ENCOUNTER — Encounter (HOSPITAL_COMMUNITY): Payer: Self-pay | Admitting: Anesthesiology

## 2015-02-04 DIAGNOSIS — Z9049 Acquired absence of other specified parts of digestive tract: Secondary | ICD-10-CM | POA: Diagnosis not present

## 2015-02-04 DIAGNOSIS — E876 Hypokalemia: Secondary | ICD-10-CM | POA: Diagnosis not present

## 2015-02-04 DIAGNOSIS — K660 Peritoneal adhesions (postprocedural) (postinfection): Secondary | ICD-10-CM | POA: Diagnosis present

## 2015-02-04 DIAGNOSIS — Z803 Family history of malignant neoplasm of breast: Secondary | ICD-10-CM

## 2015-02-04 DIAGNOSIS — D49512 Neoplasm of unspecified behavior of left kidney: Secondary | ICD-10-CM | POA: Diagnosis not present

## 2015-02-04 DIAGNOSIS — Z90710 Acquired absence of both cervix and uterus: Secondary | ICD-10-CM | POA: Diagnosis not present

## 2015-02-04 DIAGNOSIS — D3002 Benign neoplasm of left kidney: Secondary | ICD-10-CM | POA: Diagnosis not present

## 2015-02-04 DIAGNOSIS — J45909 Unspecified asthma, uncomplicated: Secondary | ICD-10-CM | POA: Diagnosis present

## 2015-02-04 DIAGNOSIS — K219 Gastro-esophageal reflux disease without esophagitis: Secondary | ICD-10-CM | POA: Diagnosis not present

## 2015-02-04 DIAGNOSIS — I10 Essential (primary) hypertension: Secondary | ICD-10-CM | POA: Diagnosis present

## 2015-02-04 DIAGNOSIS — H9192 Unspecified hearing loss, left ear: Secondary | ICD-10-CM | POA: Diagnosis present

## 2015-02-04 DIAGNOSIS — G8918 Other acute postprocedural pain: Secondary | ICD-10-CM | POA: Diagnosis not present

## 2015-02-04 DIAGNOSIS — D49519 Neoplasm of unspecified behavior of unspecified kidney: Secondary | ICD-10-CM | POA: Diagnosis present

## 2015-02-04 DIAGNOSIS — N2889 Other specified disorders of kidney and ureter: Secondary | ICD-10-CM | POA: Diagnosis present

## 2015-02-04 DIAGNOSIS — Z01812 Encounter for preprocedural laboratory examination: Secondary | ICD-10-CM

## 2015-02-04 DIAGNOSIS — R109 Unspecified abdominal pain: Secondary | ICD-10-CM | POA: Diagnosis not present

## 2015-02-04 DIAGNOSIS — C642 Malignant neoplasm of left kidney, except renal pelvis: Secondary | ICD-10-CM | POA: Diagnosis not present

## 2015-02-04 DIAGNOSIS — Z800 Family history of malignant neoplasm of digestive organs: Secondary | ICD-10-CM | POA: Diagnosis not present

## 2015-02-04 DIAGNOSIS — Z8249 Family history of ischemic heart disease and other diseases of the circulatory system: Secondary | ICD-10-CM | POA: Diagnosis not present

## 2015-02-04 HISTORY — PX: NEPHRECTOMY: SHX65

## 2015-02-04 LAB — HEMOGLOBIN AND HEMATOCRIT, BLOOD
HEMATOCRIT: 41.7 % (ref 36.0–46.0)
Hemoglobin: 12.7 g/dL (ref 12.0–15.0)

## 2015-02-04 LAB — BASIC METABOLIC PANEL
Anion gap: 7 (ref 5–15)
BUN: 12 mg/dL (ref 6–20)
CALCIUM: 8.1 mg/dL — AB (ref 8.9–10.3)
CO2: 25 mmol/L (ref 22–32)
CREATININE: 0.72 mg/dL (ref 0.44–1.00)
Chloride: 108 mmol/L (ref 101–111)
GFR calc Af Amer: >60 mL/min (ref >60)
Glucose, Bld: 150 mg/dL — ABNORMAL HIGH (ref 65–99)
POTASSIUM: 3.9 mmol/L (ref 3.5–5.1)
SODIUM: 140 mmol/L (ref 135–145)

## 2015-02-04 LAB — TYPE AND SCREEN
ABO/RH(D): O POS
Antibody Screen: NEGATIVE

## 2015-02-04 SURGERY — NEPHRECTOMY
Anesthesia: Epidural | Laterality: Left

## 2015-02-04 MED ORDER — ONDANSETRON HCL 4 MG/2ML IJ SOLN
INTRAMUSCULAR | Status: AC
  Filled 2015-02-04: qty 2

## 2015-02-04 MED ORDER — SUGAMMADEX SODIUM 200 MG/2ML IV SOLN
INTRAVENOUS | Status: DC | PRN
  Administered 2015-02-04: 170 mg via INTRAVENOUS

## 2015-02-04 MED ORDER — HYDROMORPHONE 1 MG/ML IV SOLN
INTRAVENOUS | Status: AC
  Filled 2015-02-04: qty 25

## 2015-02-04 MED ORDER — DIPHENHYDRAMINE HCL 12.5 MG/5ML PO ELIX: 12.5000 mg | ORAL_SOLUTION | Freq: Four times a day (QID) | ORAL | Status: DC | PRN

## 2015-02-04 MED ORDER — HYDROMORPHONE 1 MG/ML IV SOLN
INTRAVENOUS | Status: DC
  Administered 2015-02-04: 17:00:00 via INTRAVENOUS

## 2015-02-04 MED ORDER — PHENYLEPHRINE HCL 10 MG/ML IJ SOLN
10.0000 mg | INTRAMUSCULAR | Status: DC | PRN
  Administered 2015-02-04: 15 ug/min via INTRAVENOUS

## 2015-02-04 MED ORDER — DOCUSATE SODIUM 100 MG PO CAPS: 100.0000 mg | ORAL_CAPSULE | Freq: Two times a day (BID) | ORAL | Status: DC

## 2015-02-04 MED ORDER — HYDROMORPHONE HCL 1 MG/ML IJ SOLN
INTRAMUSCULAR | Status: AC
  Filled 2015-02-04: qty 1

## 2015-02-04 MED ORDER — DEXTROSE-NACL 5-0.45 % IV SOLN
INTRAVENOUS | Status: DC
  Administered 2015-02-04 – 2015-02-05 (×2): via INTRAVENOUS

## 2015-02-04 MED ORDER — LIDOCAINE HCL (CARDIAC) 20 MG/ML IV SOLN
INTRAVENOUS | Status: AC
  Filled 2015-02-04: qty 5

## 2015-02-04 MED ORDER — PROMETHAZINE HCL 25 MG/ML IJ SOLN: 6.2500 mg | INTRAMUSCULAR | Status: DC | PRN

## 2015-02-04 MED ORDER — CEFAZOLIN SODIUM 1-5 GM-% IV SOLN
1.0000 g | Freq: Three times a day (TID) | INTRAVENOUS | Status: AC
  Administered 2015-02-04 – 2015-02-05 (×2): 1 g via INTRAVENOUS
  Filled 2015-02-04 (×2): qty 50

## 2015-02-04 MED ORDER — HYDROMORPHONE HCL 1 MG/ML IJ SOLN
0.5000 mg | INTRAMUSCULAR | Status: DC | PRN
  Administered 2015-02-05: 0.5 mg via INTRAVENOUS
  Filled 2015-02-04: qty 1

## 2015-02-04 MED ORDER — SODIUM CHLORIDE 0.9 % IJ SOLN
INTRAMUSCULAR | Status: AC
  Filled 2015-02-04: qty 10

## 2015-02-04 MED ORDER — MIDAZOLAM HCL 5 MG/5ML IJ SOLN
INTRAMUSCULAR | Status: DC | PRN
  Administered 2015-02-04 (×2): 1 mg via INTRAVENOUS

## 2015-02-04 MED ORDER — NALOXONE HCL 0.4 MG/ML IJ SOLN: 0.4000 mg | INTRAMUSCULAR | Status: DC | PRN

## 2015-02-04 MED ORDER — ROCURONIUM BROMIDE 100 MG/10ML IV SOLN
INTRAVENOUS | Status: AC
  Filled 2015-02-04: qty 1

## 2015-02-04 MED ORDER — SODIUM CHLORIDE 0.9 % IJ SOLN: 9.0000 mL | INTRAMUSCULAR | Status: DC | PRN

## 2015-02-04 MED ORDER — ACETAMINOPHEN 10 MG/ML IV SOLN: 1000.0000 mg | Freq: Four times a day (QID) | INTRAVENOUS | Status: DC

## 2015-02-04 MED ORDER — ALBUTEROL SULFATE (2.5 MG/3ML) 0.083% IN NEBU: 2.5000 mg | INHALATION_SOLUTION | Freq: Four times a day (QID) | RESPIRATORY_TRACT | Status: DC | PRN

## 2015-02-04 MED ORDER — DIPHENHYDRAMINE HCL 12.5 MG/5ML PO ELIX
12.5000 mg | ORAL_SOLUTION | Freq: Four times a day (QID) | ORAL | Status: DC | PRN
  Filled 2015-02-04: qty 5

## 2015-02-04 MED ORDER — SUGAMMADEX SODIUM 200 MG/2ML IV SOLN
INTRAVENOUS | Status: AC
  Filled 2015-02-04: qty 2

## 2015-02-04 MED ORDER — LIDOCAINE HCL (CARDIAC) 20 MG/ML IV SOLN
INTRAVENOUS | Status: DC | PRN
  Administered 2015-02-04: 25 mg via INTRATRACHEAL
  Administered 2015-02-04: 75 mg via INTRAVENOUS

## 2015-02-04 MED ORDER — SUFENTANIL CITRATE 50 MCG/ML IV SOLN
INTRAVENOUS | Status: AC
  Filled 2015-02-04: qty 1

## 2015-02-04 MED ORDER — CEFAZOLIN SODIUM-DEXTROSE 2-3 GM-% IV SOLR
2.0000 g | INTRAVENOUS | Status: AC
  Administered 2015-02-04: 2 g via INTRAVENOUS

## 2015-02-04 MED ORDER — HYDROMORPHONE HCL 1 MG/ML IJ SOLN
0.2500 mg | INTRAMUSCULAR | Status: DC | PRN
  Administered 2015-02-04 (×4): 0.5 mg via INTRAVENOUS

## 2015-02-04 MED ORDER — SODIUM CHLORIDE 0.9 % IJ SOLN
INTRAMUSCULAR | Status: AC
  Filled 2015-02-04: qty 50

## 2015-02-04 MED ORDER — DIPHENHYDRAMINE HCL 50 MG/ML IJ SOLN: 12.5000 mg | Freq: Four times a day (QID) | INTRAMUSCULAR | Status: DC | PRN

## 2015-02-04 MED ORDER — SODIUM CHLORIDE 0.9 % IJ SOLN
INTRAMUSCULAR | Status: DC | PRN
  Administered 2015-02-04: 40 mL

## 2015-02-04 MED ORDER — METOPROLOL TARTRATE 50 MG PO TABS
50.0000 mg | ORAL_TABLET | Freq: Two times a day (BID) | ORAL | Status: DC
  Administered 2015-02-05 – 2015-02-07 (×5): 50 mg via ORAL
  Filled 2015-02-04 (×7): qty 1

## 2015-02-04 MED ORDER — OXYCODONE-ACETAMINOPHEN 5-325 MG PO TABS: 1.0000 | ORAL_TABLET | ORAL | Status: DC | PRN

## 2015-02-04 MED ORDER — ONDANSETRON HCL 4 MG/2ML IJ SOLN: 4.0000 mg | Freq: Four times a day (QID) | INTRAMUSCULAR | Status: DC | PRN

## 2015-02-04 MED ORDER — DEXAMETHASONE SODIUM PHOSPHATE 10 MG/ML IJ SOLN
INTRAMUSCULAR | Status: DC | PRN
  Administered 2015-02-04: 10 mg via INTRAVENOUS

## 2015-02-04 MED ORDER — MONTELUKAST SODIUM 10 MG PO TABS
10.0000 mg | ORAL_TABLET | Freq: Every day | ORAL | Status: DC
  Administered 2015-02-04 – 2015-02-06 (×3): 10 mg via ORAL
  Filled 2015-02-04 (×4): qty 1

## 2015-02-04 MED ORDER — PROPOFOL 10 MG/ML IV BOLUS
INTRAVENOUS | Status: AC
  Filled 2015-02-04: qty 20

## 2015-02-04 MED ORDER — BUPIVACAINE LIPOSOME 1.3 % IJ SUSP
20.0000 mL | Freq: Once | INTRAMUSCULAR | Status: AC
  Administered 2015-02-04: 20 mL
  Filled 2015-02-04: qty 20

## 2015-02-04 MED ORDER — DOCUSATE SODIUM 100 MG PO CAPS
100.0000 mg | ORAL_CAPSULE | Freq: Two times a day (BID) | ORAL | Status: DC
  Administered 2015-02-04 – 2015-02-07 (×6): 100 mg via ORAL

## 2015-02-04 MED ORDER — MIDAZOLAM HCL 2 MG/2ML IJ SOLN
INTRAMUSCULAR | Status: AC
  Filled 2015-02-04: qty 2

## 2015-02-04 MED ORDER — DEXAMETHASONE SODIUM PHOSPHATE 10 MG/ML IJ SOLN
INTRAMUSCULAR | Status: AC
  Filled 2015-02-04: qty 1

## 2015-02-04 MED ORDER — PROMETHAZINE HCL 25 MG/ML IJ SOLN
INTRAMUSCULAR | Status: AC
  Filled 2015-02-04: qty 1

## 2015-02-04 MED ORDER — ROPIVACAINE HCL 2 MG/ML IJ SOLN
7.0000 mL/h | INTRAMUSCULAR | Status: DC
  Administered 2015-02-04: 10 mL/h via EPIDURAL
  Administered 2015-02-05: 7 mL/h via EPIDURAL
  Filled 2015-02-04 (×4): qty 200

## 2015-02-04 MED ORDER — ONDANSETRON HCL 4 MG/2ML IJ SOLN
4.0000 mg | INTRAMUSCULAR | Status: DC | PRN
  Administered 2015-02-04: 4 mg via INTRAVENOUS
  Filled 2015-02-04: qty 2

## 2015-02-04 MED ORDER — ACETAMINOPHEN 10 MG/ML IV SOLN
1000.0000 mg | Freq: Four times a day (QID) | INTRAVENOUS | Status: AC
  Administered 2015-02-04 – 2015-02-05 (×4): 1000 mg via INTRAVENOUS
  Filled 2015-02-04 (×4): qty 100

## 2015-02-04 MED ORDER — SUFENTANIL CITRATE 50 MCG/ML IV SOLN
INTRAVENOUS | Status: DC | PRN
  Administered 2015-02-04: 10 ug via INTRAVENOUS
  Administered 2015-02-04: 5 ug via INTRAVENOUS
  Administered 2015-02-04: 10 ug via INTRAVENOUS
  Administered 2015-02-04: 5 ug via INTRAVENOUS

## 2015-02-04 MED ORDER — SENNA 8.6 MG PO TABS: 1.0000 | ORAL_TABLET | Freq: Every day | ORAL | Status: DC

## 2015-02-04 MED ORDER — LACTATED RINGERS IV SOLN
INTRAVENOUS | Status: DC
  Administered 2015-02-04 (×2): via INTRAVENOUS
  Administered 2015-02-04: 1000 mL via INTRAVENOUS
  Administered 2015-02-04: 14:00:00 via INTRAVENOUS

## 2015-02-04 MED ORDER — PROPOFOL 10 MG/ML IV BOLUS
INTRAVENOUS | Status: DC | PRN
  Administered 2015-02-04: 150 mg via INTRAVENOUS

## 2015-02-04 MED ORDER — CEFAZOLIN SODIUM-DEXTROSE 2-3 GM-% IV SOLR
INTRAVENOUS | Status: AC
  Filled 2015-02-04: qty 50

## 2015-02-04 MED ORDER — 0.9 % SODIUM CHLORIDE (POUR BTL) OPTIME
TOPICAL | Status: DC | PRN
  Administered 2015-02-04: 2000 mL

## 2015-02-04 MED ORDER — BUPIVACAINE HCL (PF) 0.25 % IJ SOLN
INTRAMUSCULAR | Status: DC | PRN
  Administered 2015-02-04: 5 mL via EPIDURAL
  Administered 2015-02-04 (×2): 3 mL via EPIDURAL

## 2015-02-04 MED ORDER — ROCURONIUM BROMIDE 100 MG/10ML IV SOLN
INTRAVENOUS | Status: DC | PRN
  Administered 2015-02-04: 20 mg via INTRAVENOUS
  Administered 2015-02-04: 60 mg via INTRAVENOUS
  Administered 2015-02-04: 20 mg via INTRAVENOUS

## 2015-02-04 MED ORDER — MOMETASONE FURO-FORMOTEROL FUM 100-5 MCG/ACT IN AERO
2.0000 | INHALATION_SPRAY | Freq: Two times a day (BID) | RESPIRATORY_TRACT | Status: DC
  Administered 2015-02-04 – 2015-02-07 (×4): 2 via RESPIRATORY_TRACT
  Filled 2015-02-04: qty 8.8

## 2015-02-04 SURGICAL SUPPLY — 54 items
ATTRACTOMAT 16X20 MAGNETIC DRP (DRAPES) ×2 IMPLANT
BLADE EXTENDED COATED 6.5IN (ELECTRODE) ×2 IMPLANT
BLADE HEX COATED 2.75 (ELECTRODE) ×2 IMPLANT
CATH FOLEY 2WAY SLVR  5CC 16FR (CATHETERS)
CATH FOLEY 2WAY SLVR 5CC 16FR (CATHETERS) ×1 IMPLANT
CHLORAPREP W/TINT 26ML (MISCELLANEOUS) ×2 IMPLANT
CLIP LIGATING HEM O LOK PURPLE (MISCELLANEOUS) ×7 IMPLANT
CLIP LIGATING HEMO O LOK GREEN (MISCELLANEOUS) ×5 IMPLANT
COVER SURGICAL LIGHT HANDLE (MISCELLANEOUS) ×2 IMPLANT
DECANTER SPIKE VIAL GLASS SM (MISCELLANEOUS) IMPLANT
DISSECTOR ROUND CHERRY 3/8 STR (MISCELLANEOUS) ×1 IMPLANT
DRAIN CHANNEL 15F RND FF 3/16 (WOUND CARE) ×1 IMPLANT
DRAIN PENROSE 18X1/2 LTX STRL (DRAIN) IMPLANT
DRAPE INCISE IOBAN 66X45 STRL (DRAPES) ×2 IMPLANT
DRAPE LAPAROSCOPIC ABDOMINAL (DRAPES) ×1 IMPLANT
DRAPE LAPAROTOMY TRNSV 102X78 (DRAPE) IMPLANT
DRAPE TABLE BACK 44X90 PK DISP (DRAPES) ×1 IMPLANT
DRAPE WARM FLUID 44X44 (DRAPE) ×2 IMPLANT
ELECT REM PT RETURN 9FT ADLT (ELECTROSURGICAL)
ELECTRODE REM PT RTRN 9FT ADLT (ELECTROSURGICAL) ×1 IMPLANT
EVACUATOR SILICONE 100CC (DRAIN) ×1 IMPLANT
GAUZE SPONGE 4X4 12PLY STRL (GAUZE/BANDAGES/DRESSINGS) ×3 IMPLANT
GAUZE SPONGE 4X4 16PLY XRAY LF (GAUZE/BANDAGES/DRESSINGS) ×2 IMPLANT
GLOVE BIOGEL M STRL SZ7.5 (GLOVE) ×4 IMPLANT
HEMOSTAT SURGICEL 4X8 (HEMOSTASIS) IMPLANT
KIT BASIN OR (CUSTOM PROCEDURE TRAY) ×2 IMPLANT
LIGASURE IMPACT 36 18CM CVD LR (INSTRUMENTS) ×1 IMPLANT
LOOP VESSEL MAXI BLUE (MISCELLANEOUS) ×2 IMPLANT
NS IRRIG 1000ML POUR BTL (IV SOLUTION) ×1 IMPLANT
PACK GENERAL/GYN (CUSTOM PROCEDURE TRAY) ×2 IMPLANT
POSITIONER SURGICAL ARM (MISCELLANEOUS) ×2 IMPLANT
SPONGE LAP 18X18 X RAY DECT (DISPOSABLE) ×4 IMPLANT
SPONGE SURGIFOAM ABS GEL 100 (HEMOSTASIS) IMPLANT
STAPLER VISISTAT 35W (STAPLE) IMPLANT
SURGIFLO W/THROMBIN 8M KIT (HEMOSTASIS) IMPLANT
SUT ETHILON 3 0 PS 1 (SUTURE) IMPLANT
SUT MON AB 2-0 SH 27 (SUTURE)
SUT MON AB 2-0 SH27 (SUTURE) ×2 IMPLANT
SUT PDS AB 1 TP1 96 (SUTURE) ×4 IMPLANT
SUT SILK 0 (SUTURE) ×2
SUT SILK 0 30XBRD TIE 6 (SUTURE) ×1 IMPLANT
SUT SILK 2 0 (SUTURE) ×2
SUT SILK 2-0 30XBRD TIE 12 (SUTURE) ×1 IMPLANT
SUT SILK 3 0 SH CR/8 (SUTURE) ×2 IMPLANT
SUT VIC AB 2-0 SH 27 (SUTURE) ×4
SUT VIC AB 2-0 SH 27X BRD (SUTURE) ×4 IMPLANT
SUT VIC AB 4-0 RB1 27 (SUTURE)
SUT VIC AB 4-0 RB1 27XBRD (SUTURE) ×4 IMPLANT
TAPE UMBILICAL COTTON 1/8X30 (MISCELLANEOUS) IMPLANT
TOWEL OR 17X26 10 PK STRL BLUE (TOWEL DISPOSABLE) ×3 IMPLANT
TOWEL OR NON WOVEN STRL DISP B (DISPOSABLE) ×2 IMPLANT
TUBING CONNECTING 10 (TUBING) ×2 IMPLANT
URINEMETER 200ML W/220 (MISCELLANEOUS) ×1 IMPLANT
WATER STERILE IRR 1500ML POUR (IV SOLUTION) IMPLANT

## 2015-02-04 NOTE — Progress Notes (Signed)
B/p 86/41 resp 6, patient arouses easily. Charlene edwards paged  With orders received.  Bethann Punches RN

## 2015-02-04 NOTE — Progress Notes (Signed)
[place]  Dr Alinda Money on floor patient B/p 98/49 resp 10. Order to restart Epidural drip. Restarted at 10cc per hour. ropivicaine dosage and rate verified with [place]

## 2015-02-04 NOTE — Anesthesia Postprocedure Evaluation (Signed)
Anesthesia Post Note  Patient: [name]  Procedure(s) Performed: Procedure(s) (LRB): OPEN RADICAL NEPHRECTOMY (Left)  Patient location during evaluation: PACU Anesthesia Type: General Level of consciousness: awake Pain management: pain level controlled Vital Signs Assessment: post-procedure vital signs reviewed and stable Respiratory status: spontaneous breathing Cardiovascular status: stable Anesthetic complications: no    Last Vitals:  Filed Vitals:   02/04/15 1713 02/04/15 1715  BP: 114/50 114/50  Pulse:  60  Temp: 36.6 C 36.6 C  Resp: 16 16    Last Pain:  Filed Vitals:   02/04/15 1733  PainSc: 2                  EDWARDS,Fleming Prill

## 2015-02-04 NOTE — Transfer of Care (Signed)
Immediate Anesthesia Transfer of Care Note  Patient: [name]  Procedure(s) Performed: Procedure(s): OPEN RADICAL NEPHRECTOMY (Left)  Patient Location: PACU  Anesthesia Type:General  Level of Consciousness:  sedated, patient cooperative and responds to stimulation  Airway & Oxygen Therapy:Patient Spontanous Breathing and Patient connected to face mask oxgen  Post-op Assessment:  Report given to PACU RN and Post -op Vital signs reviewed and stable  Post vital signs:  Reviewed and stable  Last Vitals:  Filed Vitals:   02/04/15 1100 02/04/15 1115  BP: 157/70 130/70  Pulse: 64 62  Temp:    Resp:      Complications: No apparent anesthesia complications

## 2015-02-04 NOTE — Anesthesia Preprocedure Evaluation (Addendum)
Anesthesia Evaluation  Patient identified by MRN, date of birth, ID band Patient awake    Reviewed: Allergy & Precautions, NPO status , Patient's Chart, lab work & pertinent test results  Airway Mallampati: II  TM Distance: >3 FB Neck ROM: Full    Dental  (+) Loose,    Pulmonary asthma , pneumonia, resolved,  CXR: mild cardiomegaly   Pulmonary exam normal breath sounds clear to auscultation       Cardiovascular Exercise Tolerance: Good hypertension, Pt. on medications and Pt. on home beta blockers Normal cardiovascular exam Rhythm:Regular Rate:Normal  ECG: SB 48, NS TWA   Neuro/Psych negative neurological ROS  negative psych ROS   GI/Hepatic Neg liver ROS, GERD  ,  Endo/Other  negative endocrine ROS  Renal/GU negative Renal ROS  negative genitourinary   Musculoskeletal negative musculoskeletal ROS (+)   Abdominal   Peds negative pediatric ROS (+)  Hematology negative hematology ROS (+)   Anesthesia Other Findings   Reproductive/Obstetrics negative OB ROS                        Anesthesia Physical Anesthesia Plan  ASA: II  Anesthesia Plan: General and Epidural   Post-op Pain Management: GA combined w/ Regional for post-op pain   Induction: Intravenous  Airway Management Planned: Oral ETT  Additional Equipment:   Intra-op Plan:   Post-operative Plan: Extubation in OR  Informed Consent: I have reviewed the patients History and Physical, chart, labs and discussed the procedure including the risks, benefits and alternatives for the proposed anesthesia with the patient or authorized representative who has indicated his/her understanding and acceptance.   Dental advisory given  Plan Discussed with: CRNA  Anesthesia Plan Comments: (Discussed r/b of epidural for post-operative pain relief. Discussed failure, headache, backache, bleeding, infection, nerve damage. Questions answered and  she consents.)       Anesthesia Quick Evaluation

## 2015-02-04 NOTE — Anesthesia Procedure Notes (Addendum)
Epidural Patient location during procedure: holding area Start time: 02/04/2015 11:00 AM  Staffing Anesthesiologist: Franne Grip Performed by: anesthesiologist   Preanesthetic Checklist Completed: patient identified, site marked, surgical consent, pre-op evaluation, timeout performed, IV checked, risks and benefits discussed, monitors and equipment checked and at surgeon's request  Epidural Patient position: sitting Prep: Betadine Patient monitoring: heart rate, continuous pulse ox and blood pressure Approach: midline Location: thoracic (1-12) Injection technique: LOR air  Needle:  Needle type: Hustead  Needle gauge: 18 G Needle length: 9 cm and 9 Needle insertion depth: 7 cm Catheter type: closed end flexible Catheter size: 20 Guage Catheter at skin depth: 11 cm Test dose: negative and 1.5% lidocaine with Epi 1:200 K  Assessment Events: blood not aspirated, injection not painful, no injection resistance, negative IV test and no paresthesia  Additional Notes Test dose 1.5% Lidocaine with epi 1:200,000  Patient tolerated the insertion well without complications. Meaningful verbal contact maintained throughout epidural placement.Reason for block:at surgeon's request  Procedure Name: Intubation Date/Time: 02/04/2015 12:42 PM Performed by: Enrigue Catena E Pre-anesthesia Checklist: Patient identified, Emergency Drugs available, Suction available and Patient being monitored Patient Re-evaluated:Patient Re-evaluated prior to inductionOxygen Delivery Method: Circle System Utilized Preoxygenation: Pre-oxygenation with 100% oxygen Intubation Type: IV induction Ventilation: Mask ventilation without difficulty Laryngoscope Size: Mac and 4 Grade View: Grade II Tube type: Oral Tube size: 7.5 mm Number of attempts: 1 Airway Equipment and Method: Stylet and Oral airway Placement Confirmation: ETT inserted through vocal cords under direct vision,  positive ETCO2 and breath sounds  checked- equal and bilateral Secured at: 21 cm Tube secured with: Tape Dental Injury: Teeth and Oropharynx as per pre-operative assessment

## 2015-02-04 NOTE — Progress Notes (Signed)
Patient ID: [name], female   DOB: 1945-04-17, 70 y.o.   MRN: [id number]  Post-op note  Subjective: The patient is doing well.  Pain currently well controlled with PCA and epidural.  Objective: Vital signs in last 24 hours: Temp:  [97.3 F (36.3 C)-97.9 F (36.6 C)] 97.3 F (36.3 C) (01/23 1810) Pulse Rate:  [54-79] 54 (01/23 1810) Resp:  [6-23] 6 (01/23 1810) BP: (86-187)/(41-109) 86/41 mmHg (01/23 1810) SpO2:  [95 %-100 %] 95 % (01/23 1810) Weight:  [83.008 kg (183 lb)] 83.008 kg (183 lb) (01/23 1715)  Intake/Output from previous day:   Intake/Output this shift: Total I/O In: 3250 [P.O.:50; I.V.:3200] Out: 900 [Urine:500; Blood:400]  Physical Exam:  General: Alert and oriented. Abdomen: Soft, Nondistended. Incisions: Clean and dry.  Lab Results:  Recent Labs  02/04/15 1640  HGB 12.7  HCT 41.7    Assessment/Plan: POD#0   1) Continue to monitor 2) Ambulate tonight with assitance   [name], [name]. MD   LOS: 0 days   [name],LES 02/04/2015, 6:18 PM

## 2015-02-04 NOTE — Progress Notes (Signed)
Noted no order for PCA Dilaudid on floor, verified with pharmacy, PCA Dilaudid automatically D/C with transfer from PACU. PCA Dilaudid removed from PCA pump and wasted accordingly. Oncoming night shift RN verified and wasted PCA Dilaudid.  Bethann Punches RN

## 2015-02-04 NOTE — Interval H&P Note (Signed)
History and Physical Interval Note:  02/04/2015 11:31 AM  [name]  has presented today for surgery, with the diagnosis of LEFT RENAL NEOPLASM  The various methods of treatment have been discussed with the patient and family. After consideration of risks, benefits and other options for treatment, the patient has consented to  Procedure(s): OPEN RADICAL NEPHRECTOMY (Left) as a surgical intervention .  The patient's history has been reviewed, patient examined, no change in status, stable for surgery.  I have reviewed the patient's chart and labs.  Questions were answered to the patient's satisfaction.     Carigan Lister,LES

## 2015-02-04 NOTE — Op Note (Signed)
Preoperative diagnosis: LEFT renal neoplasm  Postoperative diagnosis: LEFT renal neoplasm  Procedure:  1. LEFT open radical nephrectomy 2. Lysis of Adhesions >1 hour  Surgeon: Pryor Curia. M.D.  Assistant(s): Pietro Cassis. Ottis Stain, M.D.  Anesthesia: General  Complications: None  EBL: 400 mL  IVF:  See anesthesia record  Specimens: 1. LEFT kidney   Disposition of specimens: Pathology  Drains: 1. 14 Fr Foley catheter  Intraoperative findings:        1. Large cystic anterior mass with significant amount of adhesed transverse colon anteriorly and  descending colon laterally. Opening in the colonic mesentery closed with #3-0 silk suture.        2. Transverse and descending colon evaluated intra-operatively by Dr. Harlow Asa of General Surgery who  felt it appeared healthy and viable, recommended closure of mesentery with permanent suture to  prevent internal hernia       3. Single artery, single vein renovascular anatomy, both vessels taken with multiple Hem-O-Lok clips  and suture ligation. Ureter taken with clips.  Indication:  [name] is a 70 y.o. year old patient with a LEFT cystic renal neoplasm.  After a thorough review of the management options for their renal mass, they elected to proceed with surgical treatment and the above procedure.  We have discussed the potential benefits and risks of the procedure, side effects of the proposed treatment, the likelihood of the patient achieving the goals of the procedure, and any potential problems that might occur during the procedure or recuperation. Informed consent has been obtained. An epidural was placed by the anesthesia service prior to proceeding to the operating suite.   Description of procedure:  The patient was taken to the operating room and a general anesthetic was administered. The patient was given preoperative antibiotics, placed in the supine position with padding placed under their left flank. Care was  taken to pad all potential pressure points, and they were prepped and draped in the usual sterile fashion. Next a preoperative timeout was performed.  A LEFT subcostal incision was made about 2 fingerbreadths below the level of the costal margin and carried down through the subcutaneous tissues to the anterior rectus fascia. The anterior rectus fascia was then divided along with the underlying rectus muscle which exposed the posterior rectus sheath. The posterior rectus sheath and peritoneum were then sharply entered allowing direct entry into the peritoneal cavity. The layers of the abdominal wall was then opened along the length of the incision. The mass was large and immediately visible with extensive colonic adhesions including the transverse colon, descending colon and spleen. Extensive lysis of adhesions were undertaken and the transverse colon mesentery was so densely adherent to the mass, as small portion was divided in order to reflect the transverse colon off of the mass. The bowel appeared pink and viable at all times and general surgery was called intra-operatively to evaluate the bowel and felt it appeared appropriate and recommended closure of the mesenteric defect with permanent suture. We then turned our attention to freeing the lateral descending colonic adhesions and the lienorenal adhesions. Once sufficient progress had been made a a self retaining Bookwalter retractor was placed. The white line of Toldt was incised allowing the colon to be mobilized medially and the plane between the mesocolon and the anterior layer of Gerota's fascia to be developed and the mass to be exposed with the kidney pushed inferolaterally. The posterior and lateral planes were then opened with a combination of blunt and sharp  dissection allowing the kidney to be mobilized. This was particularly challenging given the size of the mass and the extent of the adhesions as noted previously. Ultimately,  the ureter and  gonadal vein were identified inferiorly and isolated. The ureter was taken with two Hem-O-Lok clips and Metzenbaum scissors.   The kidney was mobilized along the posterior plane so that we could lift up the kidney and identify the renal hilum. The renal hilum was then carefully isolated with a combination of blunt and sharp dissection allowing the renal arterial and venous structures to be separated. The renal artery was suture ligated and clipped with two Hem-o-Lok clips placed on the stump. The vein was taken in identical fashion. The kidney was then freed from the renal fossa and was sent for pathology. We then carefully inspected the renal fossa and irrigated with warm saline. Excellent hemostasis was noted. The transverse and descending colon were again inspected and were noted to be pink and viable. 3-0 silk suture was used to perform a running closure of the colon mesentery to prevent an internal hernia.  Preparations were then made for closure. #1 looped PDS sutures were used to close the abdominal wall in two layers with the posterior two layers of the lateral abdominal wall and the posterior rectus sheath closed with the first suture and a second #1 PDS looped PDS suture used to close the external oblique fascia in continuity with the anterior rectus sheath. Local anesthetic with Exparel (bupivicaine) was injected into the subfascial and subcutaneous layers. The superficial would was irrigated and the skin was reapproximated with staples. A sterile dressing was applied. The patient was able to be extubated and transferred to the recovery unit in satisfactory condition.  She will be admitted to the Urology service as an inpatient for ongoing post-operative care.  Dr. Alinda Money was present and scrubbed for the entirety of the procedure.  Pietro Cassis Ottis Stain, M.D.

## 2015-02-04 NOTE — Discharge Instructions (Signed)

## 2015-02-05 ENCOUNTER — Encounter (HOSPITAL_COMMUNITY): Payer: Self-pay | Admitting: Urology

## 2015-02-05 ENCOUNTER — Encounter (HOSPITAL_COMMUNITY): Payer: Self-pay | Admitting: Anesthesiology

## 2015-02-05 LAB — BASIC METABOLIC PANEL
ANION GAP: 6 (ref 5–15)
BUN: 15 mg/dL (ref 6–20)
CALCIUM: 8.1 mg/dL — AB (ref 8.9–10.3)
CO2: 25 mmol/L (ref 22–32)
CREATININE: 0.95 mg/dL (ref 0.44–1.00)
Chloride: 104 mmol/L (ref 101–111)
GLUCOSE: 214 mg/dL — AB (ref 65–99)
Potassium: 3.3 mmol/L — ABNORMAL LOW (ref 3.5–5.1)
Sodium: 135 mmol/L (ref 135–145)

## 2015-02-05 LAB — HEMOGLOBIN AND HEMATOCRIT, BLOOD
HEMATOCRIT: 35.4 % — AB (ref 36.0–46.0)
Hemoglobin: 11.2 g/dL — ABNORMAL LOW (ref 12.0–15.0)

## 2015-02-05 MED ORDER — HYDROCODONE-ACETAMINOPHEN 5-325 MG PO TABS
1.0000 | ORAL_TABLET | Freq: Four times a day (QID) | ORAL | Status: DC | PRN
  Administered 2015-02-05 – 2015-02-06 (×3): 2 via ORAL
  Administered 2015-02-07 (×2): 1 via ORAL
  Filled 2015-02-05: qty 1
  Filled 2015-02-05 (×2): qty 2
  Filled 2015-02-05: qty 1
  Filled 2015-02-05: qty 2

## 2015-02-05 MED ORDER — KCL IN DEXTROSE-NACL 10-5-0.45 MEQ/L-%-% IV SOLN
INTRAVENOUS | Status: DC
  Administered 2015-02-05 (×2): via INTRAVENOUS
  Filled 2015-02-05 (×3): qty 1000

## 2015-02-05 MED ORDER — ROPIVACAINE HCL 2 MG/ML IJ SOLN
7.0000 mL/h | INTRAMUSCULAR | Status: DC
  Filled 2015-02-05 (×2): qty 200

## 2015-02-05 NOTE — Progress Notes (Signed)
S: S/P Left open nephrectomy, POD #1.  Pain control has been good. 0/10 pain currently. However, right leg is too weak to stand on.  She is moving both legs well. No back pain.  O: BP 111/40.  Back: Non-red, non-tender. Epidural site intact.  A/P: Good pain control however she is unable to walk due to right leg weakness. Plan to decrease epidural rate to  7 cc/hr. We can dilute the epidural solution if need be. Discussed with Danna RN

## 2015-02-05 NOTE — Progress Notes (Signed)
Post-op note  Subjective: The patient is doing well.  No complaints. Had some difficulty ambulating because of lower extremity weakness likely 2/2 epidural. Anesthesia team evaluated and decreased epidural level to 7. Pain remains well controlled. Currently no PCA. Scheduled IV tylenol and breakthrough IV dilaudid. Pain level has been 3 at worst. Sleeping comfortably this afternoon. Will attempt ambulation later this evening with assistance.  Objective: Vital signs in last 24 hours: Temp:  [97.3 F (36.3 C)-98.6 F (37 C)] 97.5 F (36.4 C) (01/24 1354) Pulse Rate:  [52-79] 62 (01/24 1354) Resp:  [6-23] 16 (01/24 1354) BP: (80-158)/(36-109) 128/55 mmHg (01/24 1354) SpO2:  [95 %-100 %] 98 % (01/24 1354) Weight:  [83.008 kg (183 lb)] 83.008 kg (183 lb) (01/23 1715)  Intake/Output from previous day: 01/23 0701 - 01/24 0700 In: 5712.8 [P.O.:50; I.V.:5125; IV Piggyback:400] Out: 1350 [Urine:950; Blood:400] Intake/Output this shift: Total I/O In: 933.8 [P.O.:260; I.V.:540; Other:33.8; IV Piggyback:100] Out: 500 [Urine:500]  Physical Exam:  General: Alert and oriented. Abdomen: Soft, Nondistended. Incisions: Clean and dry. Foley: clear, yellow urine  Lab Results:  Recent Labs  02/04/15 1640 02/05/15 0448  HGB 12.7 11.2*  HCT 41.7 35.4*    Assessment/Plan: POD#1 s/p open subcostal LEFT nephrectomy  1) Continue to monitor 2) ambulate with assistance as able in the setting of epidural 3) tolerating clears, will consider advancing diet in the morning 4) consider epidural removal in the morning 5) BMP in the AM 6) remove foley once epidural removed     LOS: 1 day   [name] 02/05/2015, 2:17 PM

## 2015-02-05 NOTE — Progress Notes (Signed)
Attempted to get patient out of bed. Patient up to side of bed unable to stand, cannot bear weight on right leg, became dizzy and light headed  assisted back to bed.  Bethann Punches RN

## 2015-02-05 NOTE — Care Management Note (Signed)
Case Management Note  Patient Details  Name: [name] MRN: [id number] Date of Birth: 04-21-1945  Subjective/Objective:                  OPEN RADICAL NEPHRECTOMY (Left) Action/Plan: Discharge planning Expected Discharge Date:                  Expected Discharge Plan:  Home/Self Care  In-House Referral:     Discharge planning Services  CM Consult  Post Acute Care Choice:    Choice offered to:     DME Arranged:    DME Agency:     HH Arranged:    HH Agency:     Status of Service:  In process, will continue to follow  Medicare Important Message Given:    Date Medicare IM Given:    Medicare IM give by:    Date Additional Medicare IM Given:    Additional Medicare Important Message give by:     If discussed at [place] of Stay Meetings, dates discussed:    Additional Comments: CM reviewed and anticipates self care at home but will monitor for progression. Dellie Catholic, RN 02/05/2015, 3:13 PM

## 2015-02-05 NOTE — Progress Notes (Signed)
Utilization review completed.  

## 2015-02-05 NOTE — Progress Notes (Signed)
1 Day Post-Op Subjective: NAEON. Some soft systolic BPs o/n, epidural turned down, asymptomatic, pain controlled on epidural without need for use of PCA. Urine clear. Normoactive bowel sounds. No bm/flatus. Some nausea with initial minimal ambulation last night.  Objective: Vital signs in last 24 hours: Temp:  [97.3 F (36.3 C)-98.6 F (37 C)] 98.6 F (37 C) (01/24 0625) Pulse Rate:  [52-79] 71 (01/24 0650) Resp:  [6-23] 16 (01/24 0650) BP: (80-187)/(36-109) 111/40 mmHg (01/24 0625) SpO2:  [95 %-100 %] 97 % (01/24 0650) Weight:  [83.008 kg (183 lb)] 83.008 kg (183 lb) (01/23 1715)  Intake/Output from previous day: 01/23 0701 - 01/24 0700 In: 5071 [P.O.:50; I.V.:4750; IV Piggyback:250] Out: 1350 [Urine:950; Blood:400] Intake/Output this shift:    Physical Exam:  General: Alert and oriented CV: RRR Lungs: Clear Abdomen: Soft, ND Incisions: staples, dressing in tact, clean Ext: NT, No erythema Foley: Clear  Lab Results:  Recent Labs  02/04/15 1640 02/05/15 0448  HGB 12.7 11.2*  HCT 41.7 35.4*   BMET  Recent Labs  02/04/15 1640 02/05/15 0448  NA 140 135  K 3.9 3.3*  CL 108 104  CO2 25 25  GLUCOSE 150* 214*  BUN 12 15  CREATININE 0.72 0.95  CALCIUM 8.1* 8.1*     Studies/Results: No results found.  Assessment/Plan: 1) Ambulate, Incentive spirometry 2) Advance diet to clear liquids 3) Continue Epidural, PCA, IV tylenol 4) Dulcolax suppository 5) Continue urethral catheter in setting of epidural   LOS: 1 day   [name] 02/05/2015, 7:07 AM

## 2015-02-05 NOTE — Addendum Note (Signed)
Addendum  created 02/05/15 [id number] by Franne Grip, MD   Modules edited: Orders

## 2015-02-05 NOTE — Addendum Note (Signed)
Addendum  created 02/05/15 [id number] by Franne Grip, MD   Modules edited: Clinical Notes, PRL Based Order Sets   Clinical Notes:  File: [id number]; Pend: [id number]

## 2015-02-06 LAB — BASIC METABOLIC PANEL
ANION GAP: 5 (ref 5–15)
BUN: 9 mg/dL (ref 6–20)
CALCIUM: 7.6 mg/dL — AB (ref 8.9–10.3)
CHLORIDE: 109 mmol/L (ref 101–111)
CO2: 26 mmol/L (ref 22–32)
Creatinine, Ser: 0.92 mg/dL (ref 0.44–1.00)
GFR calc non Af Amer: >60 mL/min (ref >60)
Glucose, Bld: 104 mg/dL — ABNORMAL HIGH (ref 65–99)
POTASSIUM: 3.6 mmol/L (ref 3.5–5.1)
Sodium: 140 mmol/L (ref 135–145)

## 2015-02-06 LAB — HEMOGLOBIN AND HEMATOCRIT, BLOOD
HCT: 34 % — ABNORMAL LOW (ref 36.0–46.0)
Hemoglobin: 10.4 g/dL — ABNORMAL LOW (ref 12.0–15.0)

## 2015-02-06 MED ORDER — HYDROMORPHONE HCL 1 MG/ML IJ SOLN: 0.5000 mg | INTRAMUSCULAR | Status: DC | PRN

## 2015-02-06 MED ORDER — IRBESARTAN 300 MG PO TABS
300.0000 mg | ORAL_TABLET | Freq: Every day | ORAL | Status: DC
  Administered 2015-02-06 – 2015-02-07 (×2): 300 mg via ORAL
  Filled 2015-02-06 (×3): qty 1

## 2015-02-06 MED ORDER — IRBESARTAN-HYDROCHLOROTHIAZIDE 300-12.5 MG PO TABS: 1.0000 | ORAL_TABLET | Freq: Every day | ORAL | Status: DC

## 2015-02-06 MED ORDER — HEPARIN SODIUM (PORCINE) 5000 UNIT/ML IJ SOLN
5000.0000 [IU] | Freq: Three times a day (TID) | INTRAMUSCULAR | Status: DC
Start: 2015-02-06 — End: 2015-02-07
  Administered 2015-02-06 – 2015-02-07 (×2): 5000 [IU] via SUBCUTANEOUS
  Filled 2015-02-06 (×5): qty 1

## 2015-02-06 MED ORDER — ACETAMINOPHEN 325 MG PO TABS
650.0000 mg | ORAL_TABLET | Freq: Four times a day (QID) | ORAL | Status: DC
  Administered 2015-02-06 – 2015-02-07 (×5): 650 mg via ORAL
  Filled 2015-02-06 (×8): qty 2

## 2015-02-06 MED ORDER — ACETAMINOPHEN 325 MG PO TABS: 650.0000 mg | ORAL_TABLET | Freq: Four times a day (QID) | ORAL | Status: DC

## 2015-02-06 MED ORDER — BISACODYL 10 MG RE SUPP
10.0000 mg | Freq: Once | RECTAL | Status: AC
  Administered 2015-02-06: 10 mg via RECTAL
  Filled 2015-02-06: qty 1

## 2015-02-06 MED ORDER — HYDROCHLOROTHIAZIDE 12.5 MG PO CAPS
12.5000 mg | ORAL_CAPSULE | Freq: Every day | ORAL | Status: DC
  Administered 2015-02-06 – 2015-02-07 (×2): 12.5 mg via ORAL
  Filled 2015-02-06 (×3): qty 1

## 2015-02-06 NOTE — Discharge Summary (Addendum)
Date of admission: 02/04/2015  Date of discharge: 02/07/2015  Admission diagnosis: LEFT renal mass  Discharge diagnosis: LEFT multi-cystic nephroma  Secondary diagnoses:  Past Medical History  Diagnosis Date  . Hypertension   . Asthma   . Pneumonia     in past x 2  . GERD (gastroesophageal reflux disease)   . Deafness in left ear     WEARS HEARING AID LEFT EAR     History and Physical: For full details, please see admission history and physical. Briefly, [name] is a 70 y.o. year old patient with large LEFT cystic renal mass. She was admitted following planned open LEFT radical nephrectomy. Please see H&P for full details.   Hospital Course:   Renal Mass-She underwent an open (anterior subcostal) RIGHT radical nephrectomy on 02/04/15. This was uncomplicated. She was managed with an epidural for pain control on POD 1 and POD 2. Her diet was slowly advanced. Her epidural and urethral catheter were removed on POD 3.   On POD 4 she was tolerating a regular diet, pain was controlled on oral medications only and she was passing flatus and bowel movements. Pathology returned as a benign multi-cystic nephroma. She was discharged home with plans to follow-up in clinic for staple removal and post-op check. Creatinine at time of discharge was 0.81.  Laboratory values:   Recent Labs  02/04/15 1640 02/05/15 0448 02/06/15 0420  HGB 12.7 11.2* 10.4*  HCT 41.7 35.4* 34.0*    Recent Labs  02/06/15 0420 02/07/15 0439  CREATININE 0.92 0.81    Physical Exam:  Vital signs in last 24 hours: Temp:  [98.4 F (36.9 C)-98.7 F (37.1 C)] 98.4 F (36.9 C) (01/26 0604) Pulse Rate:  [71-73] 71 (01/26 0604) Resp:  [16-17] 16 (01/26 0604) BP: (156-190)/(65-72) 190/72 mmHg (01/26 0604) SpO2:  [94 %-96 %] 96 % (01/26 1010) FiO2 (%):  [21 %] 21 % (01/26 1010)  Constitutional:  Alert and oriented, No acute distress Cardiovascular: Regular rate and rhythm, No JVD Respiratory: Normal  respiratory effort, SORA GI: Abdomen is soft, nontender, nondistended, no abdominal masses, staples in place over subcostal incision Genitourinary: No CVAT. Normal female phallus, testes are descended bilaterally and non-tender and without masses, scrotum is normal in appearance without lesions or masses, perineum is normal on inspection. Rectal: deferred Neurologic: Grossly intact, no focal deficits Psychiatric: Normal mood and affect     Disposition: Home  Discharge instruction: The patient was instructed to be ambulatory but told to refrain from heavy lifting, strenuous activity, or driving.   Discharge medications:    Medication List    STOP taking these medications        aspirin EC 81 MG tablet     fexofenadine 180 MG tablet  Commonly known as:  ALLEGRA      TAKE these medications        albuterol 108 (90 Base) MCG/ACT inhaler  Commonly known as:  PROVENTIL HFA;VENTOLIN HFA  Inhale 1 puff into the lungs every 6 (six) hours as needed for wheezing or shortness of breath.     docusate sodium 100 MG capsule  Commonly known as:  COLACE  Take 1 capsule (100 mg total) by mouth 2 (two) times daily.     Fluticasone-Salmeterol 100-50 MCG/DOSE Aepb  Commonly known as:  ADVAIR  Inhale 1 puff into the lungs 2 (two) times daily as needed (for shortness of breathe).     HYDROcodone-acetaminophen 5-325 MG tablet  Commonly known as:  NORCO/VICODIN  Take  1-2 tablets by mouth every 6 (six) hours as needed.     irbesartan-hydrochlorothiazide 300-12.5 MG tablet  Commonly known as:  AVALIDE  Take 1 tablet by mouth daily.     metoprolol 50 MG tablet  Commonly known as:  LOPRESSOR  Take 50 mg by mouth 2 (two) times daily.     montelukast 10 MG tablet  Commonly known as:  SINGULAIR  Take 10 mg by mouth at bedtime.     PRESCRIPTION MEDICATION  Inject 1 Units as directed once a week. Allergy shot     senna 8.6 MG Tabs tablet  Commonly known as:  SENOKOT  Take 1 tablet (8.6 mg  total) by mouth daily.        Followup: As scheduled with Dr. Alinda Money for post-op check and staple removal.

## 2015-02-06 NOTE — Progress Notes (Addendum)
2 Days Post-Op Subjective: NAEON.Pain controlled with epidural (2-4 range). Ambulated in the hall x2. Doesn't feel limited by epidurla. Hb with slight drift as expected. Tolerating clear liquids, ambulated x 1 with assistance. Passing flatus, no BM  Objective: Vital signs in last 24 hours: Temp:  [97.3 F (36.3 C)-98.9 F (37.2 C)] 98.9 F (37.2 C) (01/25 0542) Pulse Rate:  [56-77] 77 (01/25 0542) Resp:  [15-18] 16 (01/25 0542) BP: (111-149)/(46-59) 145/53 mmHg (01/25 0542) SpO2:  [96 %-99 %] 98 % (01/25 0542)  Intake/Output from previous day: 01/24 0701 - 01/25 0700 In: 3316.9 [P.O.:1320; I.V.:1810; IV Piggyback:100] Out: 2900 [Urine:2900] Intake/Output this shift: Total I/O In: 1652.5 [P.O.:720; I.V.:932.5] Out: 1300 [Urine:1300]  Physical Exam:  General: Alert and oriented CV: RRR Lungs: Clear Abdomen: Soft, ND Incisions: staples c/d/i Ext: NT, No erythema Foley: Clear  Lab Results:  Recent Labs  02/04/15 1640 02/05/15 0448 02/06/15 0420  HGB 12.7 11.2* 10.4*  HCT 41.7 35.4* 34.0*   BMET  Recent Labs  02/05/15 0448 02/06/15 0420  NA 135 140  K 3.3* 3.6  CL 104 109  CO2 25 26  GLUCOSE 214* 104*  BUN 15 9  CREATININE 0.95 0.92  CALCIUM 8.1* 7.6*     Studies/Results: No results found.  Assessment/Plan: 1) Ambulate, Incentive spirometry 2) Advance diet as tolerated 3) Continue Epidural so long as doesn't impede ambulation, scheduled oral tylenol and oral hydrocodone 4) Dulcolax suppository 5) Continue urethral catheter in setting of epidural 6) will consider adding prophylactic heparin in addition to ambulation   LOS: 2 days   [name] 02/06/2015, 6:44 AM    I have seen and examined the patient and agree with the above assessment and plan. Pt getting good pain control with epidural and able to ambulate yesterday following epidural adjustment. Passing flatus.  Plan to advance diet.  Will talk with Dr. Eliseo Shea about possible epidural  removal later this afternoon with transition to oral pain meds.  Will then d/c Foley after epidural out.  Possible d/c home tomorrow if continuing to make good progress.

## 2015-02-06 NOTE — Progress Notes (Signed)
S: Asked by Dr. Alinda Money to remove epidural.   Pain currently 0/10  O: Has been walking in the hall without difficulty with assistance. Denies back pain. On no anticoagulants. BP 136/62   Removed epidural, tip is intact. Site non-red, non-tender.  Band-aid applied.  A/P: Epidural removed.  Kathlee Nations RN notified.  Patient reminded to ask for pain medicines PRN.

## 2015-02-06 NOTE — Addendum Note (Signed)
Addendum  created 02/06/15 1244 by Franne Grip, MD   Modules edited: Clinical Notes   Clinical Notes:  File: [id number]

## 2015-02-06 NOTE — Progress Notes (Signed)
2 Days Post-Op Subjective: Continues to do well. Epidural out. Pain well controlled on oral medications. Tolerating regular diet, passing flatus and BM. Pathology returned as benign multi-cystic nephroma. Foley removed, awaiting TOV.  Objective: Vital signs in last 24 hours: Temp:  [97.5 F (36.4 C)-98.9 F (37.2 C)] 98.5 F (36.9 C) (01/25 1450) Pulse Rate:  [65-81] 73 (01/25 1450) Resp:  [15-16] 16 (01/25 1450) BP: (131-173)/(50-66) 173/66 mmHg (01/25 1450) SpO2:  [95 %-99 %] 96 % (01/25 1450)  Intake/Output from previous day: 01/24 0701 - 01/25 0700 In: 3316.9 [P.O.:1320; I.V.:1810; IV Piggyback:100] Out: 2900 [Urine:2900] Intake/Output this shift: Total I/O In: 240 [P.O.:240] Out: 800 [Urine:800]  Physical Exam:  General: Alert and oriented CV: RRR Lungs: Clear Abdomen: Soft, ND Incisions: staples c/d/i Ext: NT, No erythema Foley: removed  Lab Results:  Recent Labs  02/04/15 1640 02/05/15 0448 02/06/15 0420  HGB 12.7 11.2* 10.4*  HCT 41.7 35.4* 34.0*   BMET  Recent Labs  02/05/15 0448 02/06/15 0420  NA 135 140  K 3.3* 3.6  CL 104 109  CO2 25 26  GLUCOSE 214* 104*  BUN 15 9  CREATININE 0.95 0.92  CALCIUM 8.1* 7.6*     Studies/Results: No results found.  Assessment/Plan: 1) Ambulate, Incentive spirometry 2) Advance diet as tolerated 3) PO pain medications 4) Dulcolax suppository 5) D/C urethral catheter, follow-up TOV  6) Start prophylactic heparin 7) anticipate discharge tomorrow   LOS: 2 days   [name] 02/06/2015, 5:11 PM

## 2015-02-07 LAB — BASIC METABOLIC PANEL
ANION GAP: 7 (ref 5–15)
BUN: 8 mg/dL (ref 6–20)
CHLORIDE: 104 mmol/L (ref 101–111)
CO2: 28 mmol/L (ref 22–32)
Calcium: 8.3 mg/dL — ABNORMAL LOW (ref 8.9–10.3)
Creatinine, Ser: 0.81 mg/dL (ref 0.44–1.00)
GFR calc Af Amer: >60 mL/min (ref >60)
GLUCOSE: 88 mg/dL (ref 65–99)
POTASSIUM: 3.2 mmol/L — AB (ref 3.5–5.1)
Sodium: 139 mmol/L (ref 135–145)

## 2015-02-07 MED ORDER — HYDROCODONE-ACETAMINOPHEN 5-325 MG PO TABS: 1.0000 | ORAL_TABLET | Freq: Four times a day (QID) | ORAL | Status: DC | PRN

## 2015-02-07 MED ORDER — POTASSIUM CHLORIDE CRYS ER 20 MEQ PO TBCR
30.0000 meq | EXTENDED_RELEASE_TABLET | Freq: Two times a day (BID) | ORAL | Status: DC
  Administered 2015-02-07: 30 meq via ORAL
  Filled 2015-02-07 (×2): qty 1

## 2015-02-07 NOTE — Progress Notes (Addendum)
3 Days Post-Op Subjective: NAEON. Single hypertensive episode early this morning to [id number] systolic, self limited and resolved. On home anti-hypertensives.Pain controlled. Ambulating x3. Voiding without difficulty. Passing flatus and BM.   Objective: Vital signs in last 24 hours: Temp:  [98.1 F (36.7 C)-98.7 F (37.1 C)] 98.4 F (36.9 C) (01/26 0604) Pulse Rate:  [71-81] 71 (01/26 0604) Resp:  [16-17] 16 (01/26 0604) BP: (136-190)/(62-72) 190/72 mmHg (01/26 0604) SpO2:  [94 %-96 %] 94 % (01/26 0604)  Intake/Output from previous day: 01/25 0701 - 01/26 0700 In: 960 [P.O.:960] Out: 3900 [Urine:3900] Intake/Output this shift: Total I/O In: 720 [P.O.:720] Out: 2100 [Urine:2100]  Physical Exam:  General: Alert and oriented CV: RRR Lungs: Clear Abdomen: Soft, ND Incisions: staples c/d/i Ext: NT, No erythema Foley: removed  Lab Results:  Recent Labs  02/04/15 1640 02/05/15 0448 02/06/15 0420  HGB 12.7 11.2* 10.4*  HCT 41.7 35.4* 34.0*   BMET  Recent Labs  02/06/15 0420 02/07/15 0439  NA 140 139  K 3.6 3.2*  CL 109 104  CO2 26 28  GLUCOSE 104* 88  BUN 9 8  CREATININE 0.92 0.81  CALCIUM 7.6* 8.3*     Studies/Results: No results found.  Assessment/Plan: 1) Ambulate, Incentive spirometry 2) Regular diet 3) PO pain medications 4) Anticipate discharge this morning/early afternoon      LOS: 3 days   [name] 02/07/2015, 6:29 AM

## 2015-02-19 DIAGNOSIS — D49512 Neoplasm of unspecified behavior of left kidney: Secondary | ICD-10-CM | POA: Diagnosis not present

## 2015-02-19 DIAGNOSIS — J3089 Other allergic rhinitis: Secondary | ICD-10-CM | POA: Diagnosis not present

## 2015-02-19 DIAGNOSIS — J301 Allergic rhinitis due to pollen: Secondary | ICD-10-CM | POA: Diagnosis not present

## 2015-02-19 DIAGNOSIS — Z0000 Encounter for general adult medical examination without abnormal findings: Secondary | ICD-10-CM | POA: Diagnosis not present

## 2015-02-22 DIAGNOSIS — J3089 Other allergic rhinitis: Secondary | ICD-10-CM | POA: Diagnosis not present

## 2015-02-22 DIAGNOSIS — J301 Allergic rhinitis due to pollen: Secondary | ICD-10-CM | POA: Diagnosis not present

## 2015-02-26 DIAGNOSIS — J301 Allergic rhinitis due to pollen: Secondary | ICD-10-CM | POA: Diagnosis not present

## 2015-02-26 DIAGNOSIS — J3089 Other allergic rhinitis: Secondary | ICD-10-CM | POA: Diagnosis not present

## 2015-03-08 DIAGNOSIS — J3089 Other allergic rhinitis: Secondary | ICD-10-CM | POA: Diagnosis not present

## 2015-03-08 DIAGNOSIS — J301 Allergic rhinitis due to pollen: Secondary | ICD-10-CM | POA: Diagnosis not present

## 2015-03-15 DIAGNOSIS — J3089 Other allergic rhinitis: Secondary | ICD-10-CM | POA: Diagnosis not present

## 2015-03-15 DIAGNOSIS — J301 Allergic rhinitis due to pollen: Secondary | ICD-10-CM | POA: Diagnosis not present

## 2015-03-22 DIAGNOSIS — J3089 Other allergic rhinitis: Secondary | ICD-10-CM | POA: Diagnosis not present

## 2015-03-22 DIAGNOSIS — J301 Allergic rhinitis due to pollen: Secondary | ICD-10-CM | POA: Diagnosis not present

## 2015-03-25 DIAGNOSIS — E668 Other obesity: Secondary | ICD-10-CM | POA: Diagnosis not present

## 2015-03-25 DIAGNOSIS — M1990 Unspecified osteoarthritis, unspecified site: Secondary | ICD-10-CM | POA: Diagnosis not present

## 2015-03-25 DIAGNOSIS — I10 Essential (primary) hypertension: Secondary | ICD-10-CM | POA: Diagnosis not present

## 2015-03-25 DIAGNOSIS — N2889 Other specified disorders of kidney and ureter: Secondary | ICD-10-CM | POA: Diagnosis not present

## 2015-03-25 DIAGNOSIS — Z1389 Encounter for screening for other disorder: Secondary | ICD-10-CM | POA: Diagnosis not present

## 2015-03-25 DIAGNOSIS — Z6834 Body mass index (BMI) 34.0-34.9, adult: Secondary | ICD-10-CM | POA: Diagnosis not present

## 2015-04-05 DIAGNOSIS — J301 Allergic rhinitis due to pollen: Secondary | ICD-10-CM | POA: Diagnosis not present

## 2015-04-05 DIAGNOSIS — H1045 Other chronic allergic conjunctivitis: Secondary | ICD-10-CM | POA: Diagnosis not present

## 2015-04-05 DIAGNOSIS — J4530 Mild persistent asthma, uncomplicated: Secondary | ICD-10-CM | POA: Diagnosis not present

## 2015-04-05 DIAGNOSIS — J3089 Other allergic rhinitis: Secondary | ICD-10-CM | POA: Diagnosis not present

## 2015-04-11 DIAGNOSIS — J3089 Other allergic rhinitis: Secondary | ICD-10-CM | POA: Diagnosis not present

## 2015-04-11 DIAGNOSIS — J301 Allergic rhinitis due to pollen: Secondary | ICD-10-CM | POA: Diagnosis not present

## 2015-04-19 DIAGNOSIS — J301 Allergic rhinitis due to pollen: Secondary | ICD-10-CM | POA: Diagnosis not present

## 2015-04-19 DIAGNOSIS — J3089 Other allergic rhinitis: Secondary | ICD-10-CM | POA: Diagnosis not present

## 2015-05-02 DIAGNOSIS — J3089 Other allergic rhinitis: Secondary | ICD-10-CM | POA: Diagnosis not present

## 2015-05-02 DIAGNOSIS — J301 Allergic rhinitis due to pollen: Secondary | ICD-10-CM | POA: Diagnosis not present

## 2015-05-07 DIAGNOSIS — J301 Allergic rhinitis due to pollen: Secondary | ICD-10-CM | POA: Diagnosis not present

## 2015-05-07 DIAGNOSIS — J3089 Other allergic rhinitis: Secondary | ICD-10-CM | POA: Diagnosis not present

## 2015-05-10 DIAGNOSIS — J301 Allergic rhinitis due to pollen: Secondary | ICD-10-CM | POA: Diagnosis not present

## 2015-05-10 DIAGNOSIS — J3089 Other allergic rhinitis: Secondary | ICD-10-CM | POA: Diagnosis not present

## 2015-05-23 DIAGNOSIS — J301 Allergic rhinitis due to pollen: Secondary | ICD-10-CM | POA: Diagnosis not present

## 2015-05-23 DIAGNOSIS — J3089 Other allergic rhinitis: Secondary | ICD-10-CM | POA: Diagnosis not present

## 2015-05-29 DIAGNOSIS — J3089 Other allergic rhinitis: Secondary | ICD-10-CM | POA: Diagnosis not present

## 2015-05-29 DIAGNOSIS — J301 Allergic rhinitis due to pollen: Secondary | ICD-10-CM | POA: Diagnosis not present

## 2015-05-31 DIAGNOSIS — J3089 Other allergic rhinitis: Secondary | ICD-10-CM | POA: Diagnosis not present

## 2015-05-31 DIAGNOSIS — J301 Allergic rhinitis due to pollen: Secondary | ICD-10-CM | POA: Diagnosis not present

## 2015-06-05 DIAGNOSIS — J301 Allergic rhinitis due to pollen: Secondary | ICD-10-CM | POA: Diagnosis not present

## 2015-06-05 DIAGNOSIS — J3089 Other allergic rhinitis: Secondary | ICD-10-CM | POA: Diagnosis not present

## 2015-06-07 DIAGNOSIS — J3089 Other allergic rhinitis: Secondary | ICD-10-CM | POA: Diagnosis not present

## 2015-06-07 DIAGNOSIS — J301 Allergic rhinitis due to pollen: Secondary | ICD-10-CM | POA: Diagnosis not present

## 2015-06-12 DIAGNOSIS — J3089 Other allergic rhinitis: Secondary | ICD-10-CM | POA: Diagnosis not present

## 2015-06-12 DIAGNOSIS — J301 Allergic rhinitis due to pollen: Secondary | ICD-10-CM | POA: Diagnosis not present

## 2015-06-14 DIAGNOSIS — J3089 Other allergic rhinitis: Secondary | ICD-10-CM | POA: Diagnosis not present

## 2015-06-14 DIAGNOSIS — J301 Allergic rhinitis due to pollen: Secondary | ICD-10-CM | POA: Diagnosis not present

## 2015-06-18 DIAGNOSIS — J301 Allergic rhinitis due to pollen: Secondary | ICD-10-CM | POA: Diagnosis not present

## 2015-06-18 DIAGNOSIS — J3089 Other allergic rhinitis: Secondary | ICD-10-CM | POA: Diagnosis not present

## 2015-06-21 DIAGNOSIS — J301 Allergic rhinitis due to pollen: Secondary | ICD-10-CM | POA: Diagnosis not present

## 2015-06-21 DIAGNOSIS — J3089 Other allergic rhinitis: Secondary | ICD-10-CM | POA: Diagnosis not present

## 2015-06-24 DIAGNOSIS — J301 Allergic rhinitis due to pollen: Secondary | ICD-10-CM | POA: Diagnosis not present

## 2015-06-24 DIAGNOSIS — J3089 Other allergic rhinitis: Secondary | ICD-10-CM | POA: Diagnosis not present

## 2015-06-28 DIAGNOSIS — J3089 Other allergic rhinitis: Secondary | ICD-10-CM | POA: Diagnosis not present

## 2015-06-28 DIAGNOSIS — J301 Allergic rhinitis due to pollen: Secondary | ICD-10-CM | POA: Diagnosis not present

## 2015-07-04 DIAGNOSIS — J3089 Other allergic rhinitis: Secondary | ICD-10-CM | POA: Diagnosis not present

## 2015-07-04 DIAGNOSIS — J301 Allergic rhinitis due to pollen: Secondary | ICD-10-CM | POA: Diagnosis not present

## 2015-07-10 DIAGNOSIS — J3089 Other allergic rhinitis: Secondary | ICD-10-CM | POA: Diagnosis not present

## 2015-07-10 DIAGNOSIS — J301 Allergic rhinitis due to pollen: Secondary | ICD-10-CM | POA: Diagnosis not present

## 2015-07-18 DIAGNOSIS — J3089 Other allergic rhinitis: Secondary | ICD-10-CM | POA: Diagnosis not present

## 2015-07-18 DIAGNOSIS — J301 Allergic rhinitis due to pollen: Secondary | ICD-10-CM | POA: Diagnosis not present

## 2015-07-26 DIAGNOSIS — J3089 Other allergic rhinitis: Secondary | ICD-10-CM | POA: Diagnosis not present

## 2015-07-26 DIAGNOSIS — J301 Allergic rhinitis due to pollen: Secondary | ICD-10-CM | POA: Diagnosis not present

## 2015-07-26 DIAGNOSIS — D49512 Neoplasm of unspecified behavior of left kidney: Secondary | ICD-10-CM | POA: Diagnosis not present

## 2015-08-02 DIAGNOSIS — J301 Allergic rhinitis due to pollen: Secondary | ICD-10-CM | POA: Diagnosis not present

## 2015-08-02 DIAGNOSIS — J3089 Other allergic rhinitis: Secondary | ICD-10-CM | POA: Diagnosis not present

## 2015-08-09 DIAGNOSIS — J301 Allergic rhinitis due to pollen: Secondary | ICD-10-CM | POA: Diagnosis not present

## 2015-08-09 DIAGNOSIS — J3089 Other allergic rhinitis: Secondary | ICD-10-CM | POA: Diagnosis not present

## 2015-08-15 DIAGNOSIS — J301 Allergic rhinitis due to pollen: Secondary | ICD-10-CM | POA: Diagnosis not present

## 2015-08-15 DIAGNOSIS — J3089 Other allergic rhinitis: Secondary | ICD-10-CM | POA: Diagnosis not present

## 2015-08-21 DIAGNOSIS — J3089 Other allergic rhinitis: Secondary | ICD-10-CM | POA: Diagnosis not present

## 2015-08-21 DIAGNOSIS — J301 Allergic rhinitis due to pollen: Secondary | ICD-10-CM | POA: Diagnosis not present

## 2015-08-28 DIAGNOSIS — J301 Allergic rhinitis due to pollen: Secondary | ICD-10-CM | POA: Diagnosis not present

## 2015-09-11 DIAGNOSIS — J301 Allergic rhinitis due to pollen: Secondary | ICD-10-CM | POA: Diagnosis not present

## 2015-09-11 DIAGNOSIS — J3089 Other allergic rhinitis: Secondary | ICD-10-CM | POA: Diagnosis not present

## 2015-09-27 DIAGNOSIS — J301 Allergic rhinitis due to pollen: Secondary | ICD-10-CM | POA: Diagnosis not present

## 2015-09-27 DIAGNOSIS — J3089 Other allergic rhinitis: Secondary | ICD-10-CM | POA: Diagnosis not present

## 2015-10-02 DIAGNOSIS — J3089 Other allergic rhinitis: Secondary | ICD-10-CM | POA: Diagnosis not present

## 2015-10-02 DIAGNOSIS — J301 Allergic rhinitis due to pollen: Secondary | ICD-10-CM | POA: Diagnosis not present

## 2015-10-17 DIAGNOSIS — J301 Allergic rhinitis due to pollen: Secondary | ICD-10-CM | POA: Diagnosis not present

## 2015-10-17 DIAGNOSIS — J3089 Other allergic rhinitis: Secondary | ICD-10-CM | POA: Diagnosis not present

## 2015-10-24 DIAGNOSIS — J3089 Other allergic rhinitis: Secondary | ICD-10-CM | POA: Diagnosis not present

## 2015-10-24 DIAGNOSIS — J301 Allergic rhinitis due to pollen: Secondary | ICD-10-CM | POA: Diagnosis not present

## 2015-11-05 DIAGNOSIS — E784 Other hyperlipidemia: Secondary | ICD-10-CM | POA: Diagnosis not present

## 2015-11-05 DIAGNOSIS — R8299 Other abnormal findings in urine: Secondary | ICD-10-CM | POA: Diagnosis not present

## 2015-11-05 DIAGNOSIS — J301 Allergic rhinitis due to pollen: Secondary | ICD-10-CM | POA: Diagnosis not present

## 2015-11-05 DIAGNOSIS — N390 Urinary tract infection, site not specified: Secondary | ICD-10-CM | POA: Diagnosis not present

## 2015-11-05 DIAGNOSIS — J3089 Other allergic rhinitis: Secondary | ICD-10-CM | POA: Diagnosis not present

## 2015-11-05 DIAGNOSIS — I10 Essential (primary) hypertension: Secondary | ICD-10-CM | POA: Diagnosis not present

## 2015-11-12 DIAGNOSIS — J3089 Other allergic rhinitis: Secondary | ICD-10-CM | POA: Diagnosis not present

## 2015-11-12 DIAGNOSIS — J45909 Unspecified asthma, uncomplicated: Secondary | ICD-10-CM | POA: Diagnosis not present

## 2015-11-12 DIAGNOSIS — J301 Allergic rhinitis due to pollen: Secondary | ICD-10-CM | POA: Diagnosis not present

## 2015-11-12 DIAGNOSIS — Z0000 Encounter for general adult medical examination without abnormal findings: Secondary | ICD-10-CM | POA: Diagnosis not present

## 2015-11-12 DIAGNOSIS — Z23 Encounter for immunization: Secondary | ICD-10-CM | POA: Diagnosis not present

## 2015-11-12 DIAGNOSIS — N2889 Other specified disorders of kidney and ureter: Secondary | ICD-10-CM | POA: Diagnosis not present

## 2015-11-12 DIAGNOSIS — R413 Other amnesia: Secondary | ICD-10-CM | POA: Diagnosis not present

## 2015-11-12 DIAGNOSIS — E669 Obesity, unspecified: Secondary | ICD-10-CM | POA: Diagnosis not present

## 2015-11-12 DIAGNOSIS — J302 Other seasonal allergic rhinitis: Secondary | ICD-10-CM | POA: Diagnosis not present

## 2015-11-12 DIAGNOSIS — Z780 Asymptomatic menopausal state: Secondary | ICD-10-CM | POA: Diagnosis not present

## 2015-11-12 DIAGNOSIS — E784 Other hyperlipidemia: Secondary | ICD-10-CM | POA: Diagnosis not present

## 2015-11-12 DIAGNOSIS — I10 Essential (primary) hypertension: Secondary | ICD-10-CM | POA: Diagnosis not present

## 2015-11-13 DIAGNOSIS — Z1212 Encounter for screening for malignant neoplasm of rectum: Secondary | ICD-10-CM | POA: Diagnosis not present

## 2015-11-26 ENCOUNTER — Other Ambulatory Visit: Payer: Self-pay | Admitting: Internal Medicine

## 2015-11-26 DIAGNOSIS — Z1231 Encounter for screening mammogram for malignant neoplasm of breast: Secondary | ICD-10-CM

## 2015-11-28 DIAGNOSIS — J3089 Other allergic rhinitis: Secondary | ICD-10-CM | POA: Diagnosis not present

## 2015-11-28 DIAGNOSIS — J301 Allergic rhinitis due to pollen: Secondary | ICD-10-CM | POA: Diagnosis not present

## 2015-12-12 DIAGNOSIS — J3089 Other allergic rhinitis: Secondary | ICD-10-CM | POA: Diagnosis not present

## 2015-12-12 DIAGNOSIS — J301 Allergic rhinitis due to pollen: Secondary | ICD-10-CM | POA: Diagnosis not present

## 2015-12-31 ENCOUNTER — Ambulatory Visit
Admission: RE | Admit: 2015-12-31 | Discharge: 2015-12-31 | Disposition: A | Discharge status: Home / Self Care | Payer: Medicare HMO | Source: Ambulatory Visit | Attending: Internal Medicine | Admitting: Internal Medicine

## 2015-12-31 DIAGNOSIS — Z1231 Encounter for screening mammogram for malignant neoplasm of breast: Secondary | ICD-10-CM

## 2016-01-19 IMAGING — MG MM SCREEN MAMMOGRAM BILATERAL
4 series · 4 of 4 positions shown · non-contrast
Comparison: Previous exam(s).

CLINICAL DATA: Screening.

EXAM:
DIGITAL SCREENING BILATERAL MAMMOGRAM WITH CAD

[R CC]
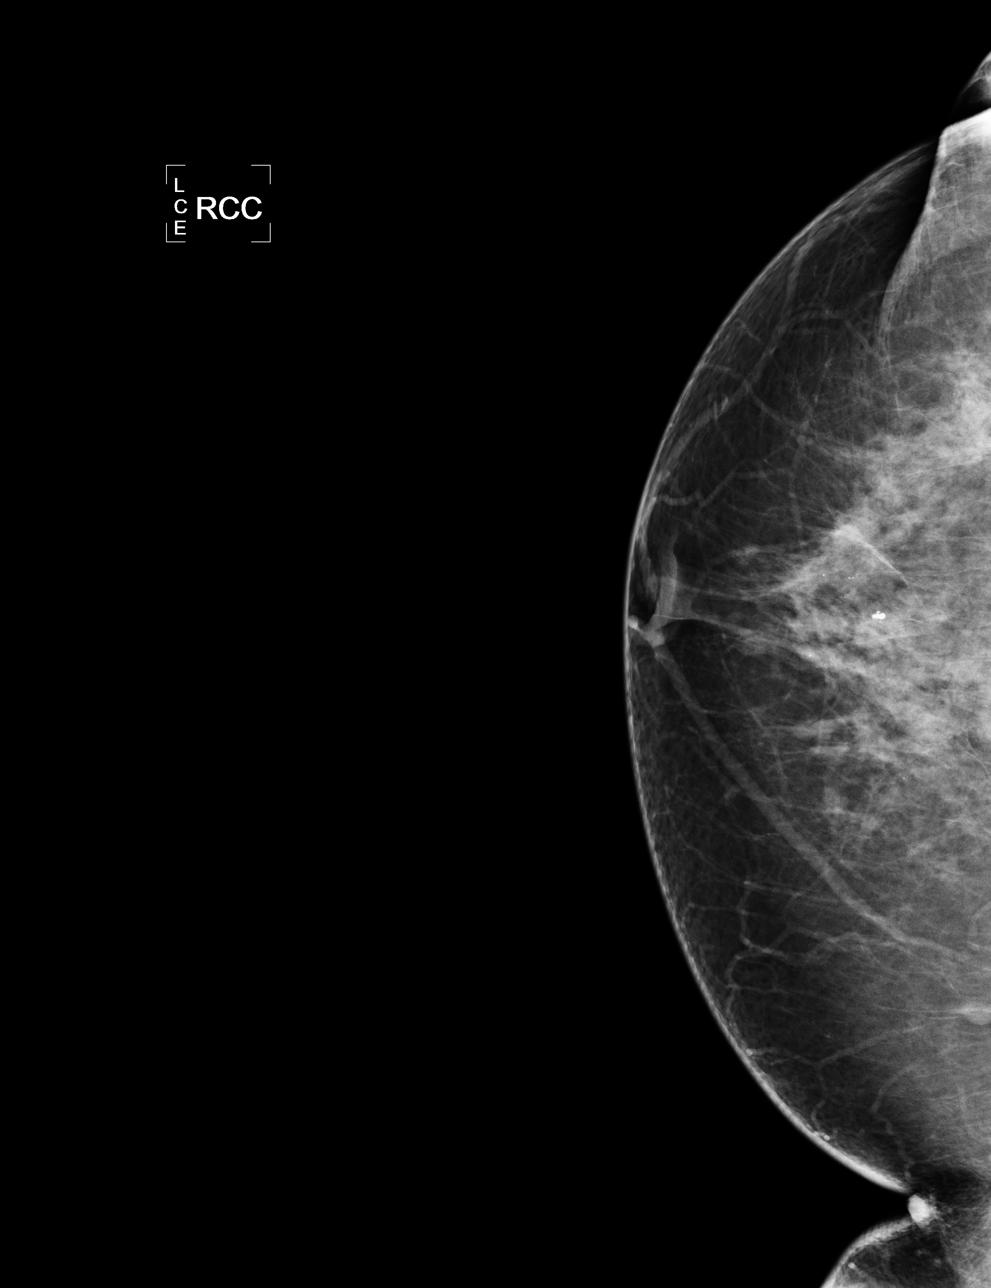

[L CC]
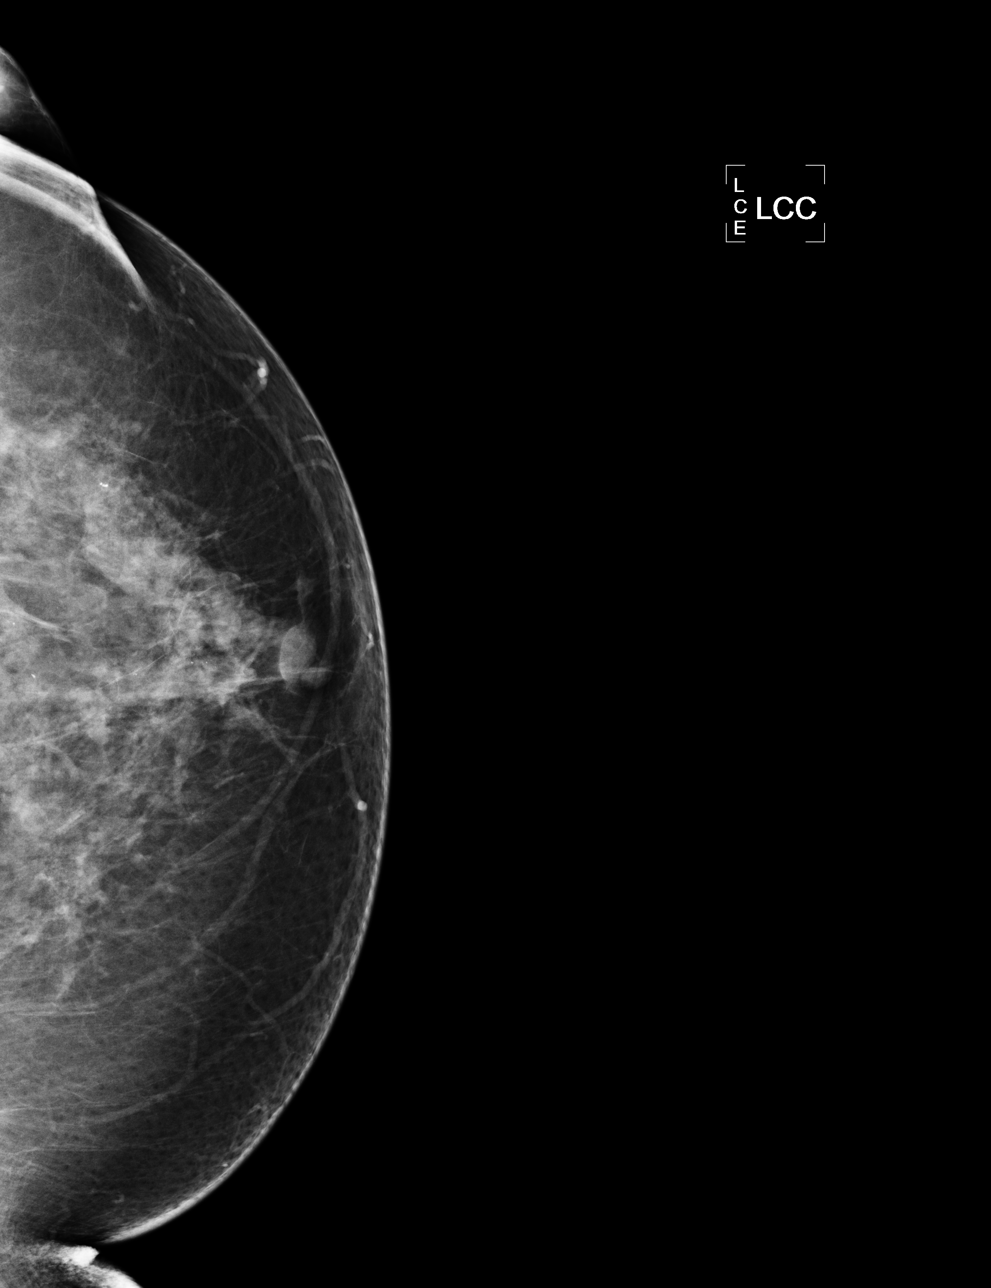

[L MLO]
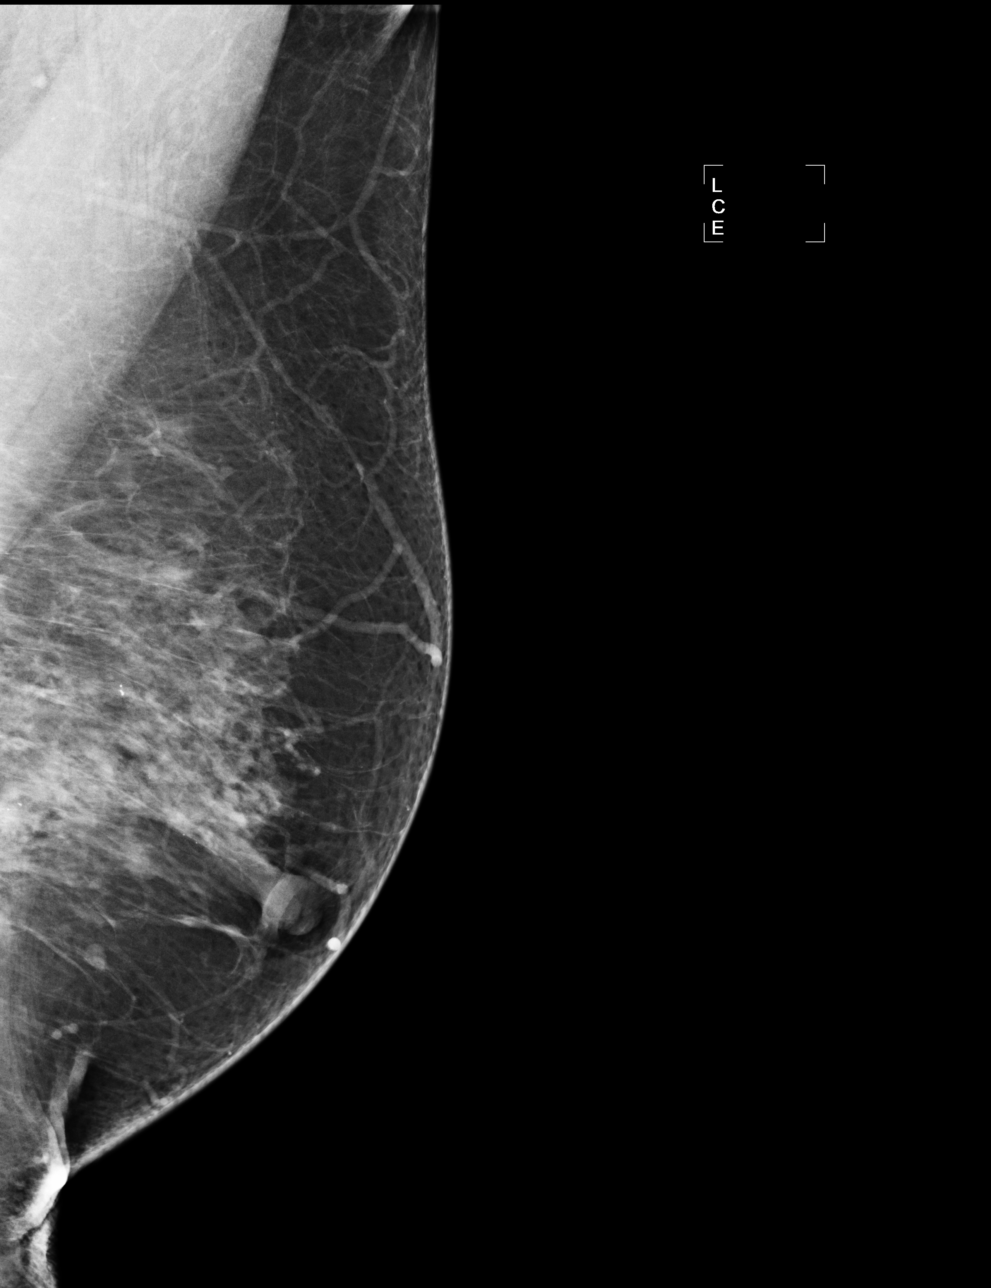

[R MLO]
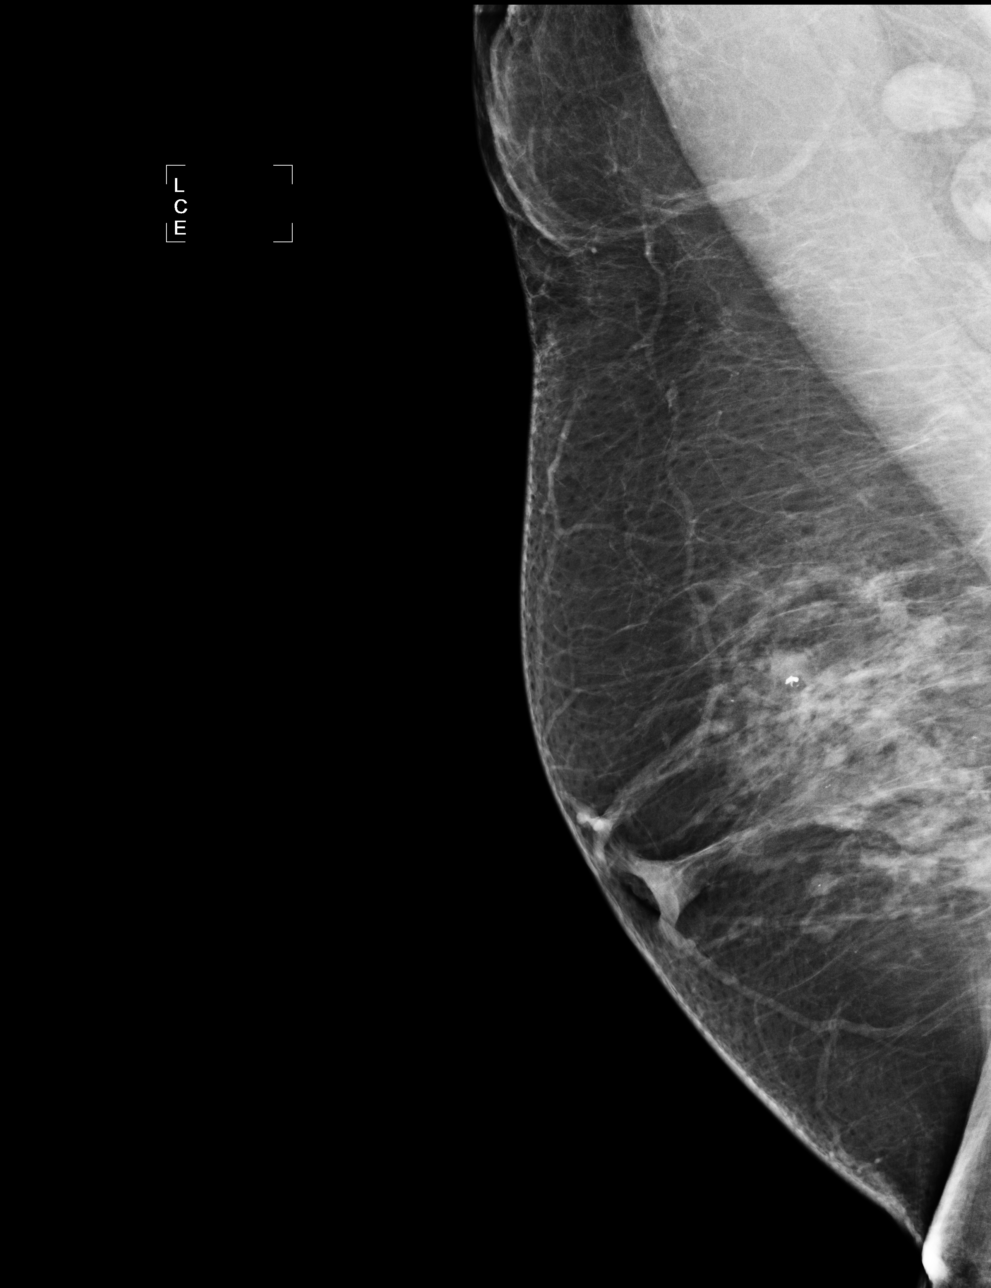

[4 of 4 positions shown; findings below may reference images not displayed]

ACR Breast Density Category c: The breast tissue is heterogeneously
dense, which may obscure small masses.
FINDINGS: There are no findings suspicious for malignancy. Images were
processed with CAD.
IMPRESSION: No mammographic evidence of malignancy. A result letter of this
screening mammogram will be mailed directly to the patient.

RECOMMENDATION:
Screening mammogram in one year. (Code:YJ-2-FEZ)

BI-RADS CATEGORY  1: Negative.

## 2016-04-03 DIAGNOSIS — H1045 Other chronic allergic conjunctivitis: Secondary | ICD-10-CM | POA: Diagnosis not present

## 2016-04-03 DIAGNOSIS — J4530 Mild persistent asthma, uncomplicated: Secondary | ICD-10-CM | POA: Diagnosis not present

## 2016-04-03 DIAGNOSIS — J3089 Other allergic rhinitis: Secondary | ICD-10-CM | POA: Diagnosis not present

## 2016-04-06 DIAGNOSIS — J3089 Other allergic rhinitis: Secondary | ICD-10-CM | POA: Diagnosis not present

## 2016-04-06 DIAGNOSIS — J301 Allergic rhinitis due to pollen: Secondary | ICD-10-CM | POA: Diagnosis not present

## 2016-04-07 DIAGNOSIS — J3089 Other allergic rhinitis: Secondary | ICD-10-CM | POA: Diagnosis not present

## 2016-04-07 DIAGNOSIS — J301 Allergic rhinitis due to pollen: Secondary | ICD-10-CM | POA: Diagnosis not present

## 2016-04-09 DIAGNOSIS — J3089 Other allergic rhinitis: Secondary | ICD-10-CM | POA: Diagnosis not present

## 2016-04-09 DIAGNOSIS — J301 Allergic rhinitis due to pollen: Secondary | ICD-10-CM | POA: Diagnosis not present

## 2016-04-14 DIAGNOSIS — J301 Allergic rhinitis due to pollen: Secondary | ICD-10-CM | POA: Diagnosis not present

## 2016-04-14 DIAGNOSIS — J3089 Other allergic rhinitis: Secondary | ICD-10-CM | POA: Diagnosis not present

## 2016-04-17 DIAGNOSIS — J301 Allergic rhinitis due to pollen: Secondary | ICD-10-CM | POA: Diagnosis not present

## 2016-04-17 DIAGNOSIS — J3089 Other allergic rhinitis: Secondary | ICD-10-CM | POA: Diagnosis not present

## 2016-04-20 DIAGNOSIS — R05 Cough: Secondary | ICD-10-CM | POA: Diagnosis not present

## 2016-04-20 DIAGNOSIS — Z6834 Body mass index (BMI) 34.0-34.9, adult: Secondary | ICD-10-CM | POA: Diagnosis not present

## 2016-04-20 DIAGNOSIS — J40 Bronchitis, not specified as acute or chronic: Secondary | ICD-10-CM | POA: Diagnosis not present

## 2016-04-20 DIAGNOSIS — J019 Acute sinusitis, unspecified: Secondary | ICD-10-CM | POA: Diagnosis not present

## 2016-04-20 DIAGNOSIS — J302 Other seasonal allergic rhinitis: Secondary | ICD-10-CM | POA: Diagnosis not present

## 2016-04-20 DIAGNOSIS — J45909 Unspecified asthma, uncomplicated: Secondary | ICD-10-CM | POA: Diagnosis not present

## 2016-04-22 DIAGNOSIS — J301 Allergic rhinitis due to pollen: Secondary | ICD-10-CM | POA: Diagnosis not present

## 2016-04-22 DIAGNOSIS — J3089 Other allergic rhinitis: Secondary | ICD-10-CM | POA: Diagnosis not present

## 2016-04-27 DIAGNOSIS — J301 Allergic rhinitis due to pollen: Secondary | ICD-10-CM | POA: Diagnosis not present

## 2016-04-27 DIAGNOSIS — J3089 Other allergic rhinitis: Secondary | ICD-10-CM | POA: Diagnosis not present

## 2016-05-01 DIAGNOSIS — J3089 Other allergic rhinitis: Secondary | ICD-10-CM | POA: Diagnosis not present

## 2016-05-01 DIAGNOSIS — J301 Allergic rhinitis due to pollen: Secondary | ICD-10-CM | POA: Diagnosis not present

## 2016-05-06 DIAGNOSIS — J301 Allergic rhinitis due to pollen: Secondary | ICD-10-CM | POA: Diagnosis not present

## 2016-05-06 DIAGNOSIS — J3089 Other allergic rhinitis: Secondary | ICD-10-CM | POA: Diagnosis not present

## 2016-05-08 DIAGNOSIS — J301 Allergic rhinitis due to pollen: Secondary | ICD-10-CM | POA: Diagnosis not present

## 2016-05-08 DIAGNOSIS — J3089 Other allergic rhinitis: Secondary | ICD-10-CM | POA: Diagnosis not present

## 2016-05-11 DIAGNOSIS — Z6836 Body mass index (BMI) 36.0-36.9, adult: Secondary | ICD-10-CM | POA: Diagnosis not present

## 2016-05-11 DIAGNOSIS — E784 Other hyperlipidemia: Secondary | ICD-10-CM | POA: Diagnosis not present

## 2016-05-11 DIAGNOSIS — I10 Essential (primary) hypertension: Secondary | ICD-10-CM | POA: Diagnosis not present

## 2016-05-11 DIAGNOSIS — K219 Gastro-esophageal reflux disease without esophagitis: Secondary | ICD-10-CM | POA: Diagnosis not present

## 2016-05-11 DIAGNOSIS — Z1389 Encounter for screening for other disorder: Secondary | ICD-10-CM | POA: Diagnosis not present

## 2016-05-11 DIAGNOSIS — M1990 Unspecified osteoarthritis, unspecified site: Secondary | ICD-10-CM | POA: Diagnosis not present

## 2016-05-11 DIAGNOSIS — E669 Obesity, unspecified: Secondary | ICD-10-CM | POA: Diagnosis not present

## 2016-05-11 DIAGNOSIS — J40 Bronchitis, not specified as acute or chronic: Secondary | ICD-10-CM | POA: Diagnosis not present

## 2016-05-11 DIAGNOSIS — J302 Other seasonal allergic rhinitis: Secondary | ICD-10-CM | POA: Diagnosis not present

## 2016-05-11 DIAGNOSIS — J45909 Unspecified asthma, uncomplicated: Secondary | ICD-10-CM | POA: Diagnosis not present

## 2016-05-12 DIAGNOSIS — J301 Allergic rhinitis due to pollen: Secondary | ICD-10-CM | POA: Diagnosis not present

## 2016-05-12 DIAGNOSIS — J3089 Other allergic rhinitis: Secondary | ICD-10-CM | POA: Diagnosis not present

## 2016-05-15 DIAGNOSIS — J301 Allergic rhinitis due to pollen: Secondary | ICD-10-CM | POA: Diagnosis not present

## 2016-05-15 DIAGNOSIS — J3089 Other allergic rhinitis: Secondary | ICD-10-CM | POA: Diagnosis not present

## 2016-05-19 DIAGNOSIS — J3089 Other allergic rhinitis: Secondary | ICD-10-CM | POA: Diagnosis not present

## 2016-05-19 DIAGNOSIS — J301 Allergic rhinitis due to pollen: Secondary | ICD-10-CM | POA: Diagnosis not present

## 2016-05-21 DIAGNOSIS — J3089 Other allergic rhinitis: Secondary | ICD-10-CM | POA: Diagnosis not present

## 2016-05-21 DIAGNOSIS — J301 Allergic rhinitis due to pollen: Secondary | ICD-10-CM | POA: Diagnosis not present

## 2016-05-27 DIAGNOSIS — J301 Allergic rhinitis due to pollen: Secondary | ICD-10-CM | POA: Diagnosis not present

## 2016-05-27 DIAGNOSIS — J3089 Other allergic rhinitis: Secondary | ICD-10-CM | POA: Diagnosis not present

## 2016-05-29 DIAGNOSIS — J3089 Other allergic rhinitis: Secondary | ICD-10-CM | POA: Diagnosis not present

## 2016-05-29 DIAGNOSIS — J301 Allergic rhinitis due to pollen: Secondary | ICD-10-CM | POA: Diagnosis not present

## 2016-06-02 DIAGNOSIS — J3089 Other allergic rhinitis: Secondary | ICD-10-CM | POA: Diagnosis not present

## 2016-06-02 DIAGNOSIS — J301 Allergic rhinitis due to pollen: Secondary | ICD-10-CM | POA: Diagnosis not present

## 2016-06-05 DIAGNOSIS — J3089 Other allergic rhinitis: Secondary | ICD-10-CM | POA: Diagnosis not present

## 2016-06-05 DIAGNOSIS — J301 Allergic rhinitis due to pollen: Secondary | ICD-10-CM | POA: Diagnosis not present

## 2016-06-10 DIAGNOSIS — J301 Allergic rhinitis due to pollen: Secondary | ICD-10-CM | POA: Diagnosis not present

## 2016-06-10 DIAGNOSIS — J3089 Other allergic rhinitis: Secondary | ICD-10-CM | POA: Diagnosis not present

## 2016-06-12 DIAGNOSIS — J3089 Other allergic rhinitis: Secondary | ICD-10-CM | POA: Diagnosis not present

## 2016-06-12 DIAGNOSIS — J301 Allergic rhinitis due to pollen: Secondary | ICD-10-CM | POA: Diagnosis not present

## 2016-06-17 DIAGNOSIS — J301 Allergic rhinitis due to pollen: Secondary | ICD-10-CM | POA: Diagnosis not present

## 2016-06-17 DIAGNOSIS — J3089 Other allergic rhinitis: Secondary | ICD-10-CM | POA: Diagnosis not present

## 2016-06-19 DIAGNOSIS — J3089 Other allergic rhinitis: Secondary | ICD-10-CM | POA: Diagnosis not present

## 2016-06-19 DIAGNOSIS — J301 Allergic rhinitis due to pollen: Secondary | ICD-10-CM | POA: Diagnosis not present

## 2016-06-26 DIAGNOSIS — J3089 Other allergic rhinitis: Secondary | ICD-10-CM | POA: Diagnosis not present

## 2016-06-26 DIAGNOSIS — J301 Allergic rhinitis due to pollen: Secondary | ICD-10-CM | POA: Diagnosis not present

## 2016-07-01 DIAGNOSIS — J301 Allergic rhinitis due to pollen: Secondary | ICD-10-CM | POA: Diagnosis not present

## 2016-07-01 DIAGNOSIS — J3089 Other allergic rhinitis: Secondary | ICD-10-CM | POA: Diagnosis not present

## 2016-07-08 DIAGNOSIS — J3089 Other allergic rhinitis: Secondary | ICD-10-CM | POA: Diagnosis not present

## 2016-07-08 DIAGNOSIS — J301 Allergic rhinitis due to pollen: Secondary | ICD-10-CM | POA: Diagnosis not present

## 2016-07-13 DIAGNOSIS — J3089 Other allergic rhinitis: Secondary | ICD-10-CM | POA: Diagnosis not present

## 2016-07-13 DIAGNOSIS — J301 Allergic rhinitis due to pollen: Secondary | ICD-10-CM | POA: Diagnosis not present

## 2016-07-21 DIAGNOSIS — J301 Allergic rhinitis due to pollen: Secondary | ICD-10-CM | POA: Diagnosis not present

## 2016-07-21 DIAGNOSIS — J3089 Other allergic rhinitis: Secondary | ICD-10-CM | POA: Diagnosis not present

## 2016-07-28 DIAGNOSIS — J3089 Other allergic rhinitis: Secondary | ICD-10-CM | POA: Diagnosis not present

## 2016-07-28 DIAGNOSIS — J301 Allergic rhinitis due to pollen: Secondary | ICD-10-CM | POA: Diagnosis not present

## 2016-08-04 DIAGNOSIS — J301 Allergic rhinitis due to pollen: Secondary | ICD-10-CM | POA: Diagnosis not present

## 2016-08-04 DIAGNOSIS — J3089 Other allergic rhinitis: Secondary | ICD-10-CM | POA: Diagnosis not present

## 2016-08-10 DIAGNOSIS — J301 Allergic rhinitis due to pollen: Secondary | ICD-10-CM | POA: Diagnosis not present

## 2016-08-10 DIAGNOSIS — J3089 Other allergic rhinitis: Secondary | ICD-10-CM | POA: Diagnosis not present

## 2016-08-14 DIAGNOSIS — J301 Allergic rhinitis due to pollen: Secondary | ICD-10-CM | POA: Diagnosis not present

## 2016-08-14 DIAGNOSIS — J3089 Other allergic rhinitis: Secondary | ICD-10-CM | POA: Diagnosis not present

## 2016-08-17 DIAGNOSIS — J3089 Other allergic rhinitis: Secondary | ICD-10-CM | POA: Diagnosis not present

## 2016-08-17 DIAGNOSIS — J301 Allergic rhinitis due to pollen: Secondary | ICD-10-CM | POA: Diagnosis not present

## 2016-08-19 DIAGNOSIS — J301 Allergic rhinitis due to pollen: Secondary | ICD-10-CM | POA: Diagnosis not present

## 2016-08-19 DIAGNOSIS — J3089 Other allergic rhinitis: Secondary | ICD-10-CM | POA: Diagnosis not present

## 2016-08-20 DIAGNOSIS — J3089 Other allergic rhinitis: Secondary | ICD-10-CM | POA: Diagnosis not present

## 2016-08-24 DIAGNOSIS — J3089 Other allergic rhinitis: Secondary | ICD-10-CM | POA: Diagnosis not present

## 2016-08-24 DIAGNOSIS — J301 Allergic rhinitis due to pollen: Secondary | ICD-10-CM | POA: Diagnosis not present

## 2016-08-31 DIAGNOSIS — J3089 Other allergic rhinitis: Secondary | ICD-10-CM | POA: Diagnosis not present

## 2016-08-31 DIAGNOSIS — J301 Allergic rhinitis due to pollen: Secondary | ICD-10-CM | POA: Diagnosis not present

## 2016-09-03 DIAGNOSIS — J301 Allergic rhinitis due to pollen: Secondary | ICD-10-CM | POA: Diagnosis not present

## 2016-09-03 DIAGNOSIS — J3089 Other allergic rhinitis: Secondary | ICD-10-CM | POA: Diagnosis not present

## 2016-09-08 DIAGNOSIS — J301 Allergic rhinitis due to pollen: Secondary | ICD-10-CM | POA: Diagnosis not present

## 2016-09-08 DIAGNOSIS — J3089 Other allergic rhinitis: Secondary | ICD-10-CM | POA: Diagnosis not present

## 2016-09-11 DIAGNOSIS — J301 Allergic rhinitis due to pollen: Secondary | ICD-10-CM | POA: Diagnosis not present

## 2016-09-11 DIAGNOSIS — J3089 Other allergic rhinitis: Secondary | ICD-10-CM | POA: Diagnosis not present

## 2016-09-15 DIAGNOSIS — J301 Allergic rhinitis due to pollen: Secondary | ICD-10-CM | POA: Diagnosis not present

## 2016-09-15 DIAGNOSIS — J3089 Other allergic rhinitis: Secondary | ICD-10-CM | POA: Diagnosis not present

## 2016-09-21 DIAGNOSIS — J301 Allergic rhinitis due to pollen: Secondary | ICD-10-CM | POA: Diagnosis not present

## 2016-09-21 DIAGNOSIS — J3089 Other allergic rhinitis: Secondary | ICD-10-CM | POA: Diagnosis not present

## 2016-09-28 DIAGNOSIS — J301 Allergic rhinitis due to pollen: Secondary | ICD-10-CM | POA: Diagnosis not present

## 2016-09-28 DIAGNOSIS — J3089 Other allergic rhinitis: Secondary | ICD-10-CM | POA: Diagnosis not present

## 2016-10-05 DIAGNOSIS — J3089 Other allergic rhinitis: Secondary | ICD-10-CM | POA: Diagnosis not present

## 2016-10-05 DIAGNOSIS — J301 Allergic rhinitis due to pollen: Secondary | ICD-10-CM | POA: Diagnosis not present

## 2016-10-14 DIAGNOSIS — J3089 Other allergic rhinitis: Secondary | ICD-10-CM | POA: Diagnosis not present

## 2016-10-14 DIAGNOSIS — J301 Allergic rhinitis due to pollen: Secondary | ICD-10-CM | POA: Diagnosis not present

## 2016-10-20 DIAGNOSIS — J301 Allergic rhinitis due to pollen: Secondary | ICD-10-CM | POA: Diagnosis not present

## 2016-10-20 DIAGNOSIS — J3089 Other allergic rhinitis: Secondary | ICD-10-CM | POA: Diagnosis not present

## 2016-10-28 DIAGNOSIS — J301 Allergic rhinitis due to pollen: Secondary | ICD-10-CM | POA: Diagnosis not present

## 2016-10-28 DIAGNOSIS — J3089 Other allergic rhinitis: Secondary | ICD-10-CM | POA: Diagnosis not present

## 2016-11-06 DIAGNOSIS — J301 Allergic rhinitis due to pollen: Secondary | ICD-10-CM | POA: Diagnosis not present

## 2016-11-06 DIAGNOSIS — J3089 Other allergic rhinitis: Secondary | ICD-10-CM | POA: Diagnosis not present

## 2016-11-13 DIAGNOSIS — J301 Allergic rhinitis due to pollen: Secondary | ICD-10-CM | POA: Diagnosis not present

## 2016-11-13 DIAGNOSIS — J3089 Other allergic rhinitis: Secondary | ICD-10-CM | POA: Diagnosis not present

## 2016-11-18 DIAGNOSIS — J3089 Other allergic rhinitis: Secondary | ICD-10-CM | POA: Diagnosis not present

## 2016-11-18 DIAGNOSIS — J301 Allergic rhinitis due to pollen: Secondary | ICD-10-CM | POA: Diagnosis not present

## 2016-11-18 DIAGNOSIS — R82998 Other abnormal findings in urine: Secondary | ICD-10-CM | POA: Diagnosis not present

## 2016-11-18 DIAGNOSIS — E7849 Other hyperlipidemia: Secondary | ICD-10-CM | POA: Diagnosis not present

## 2016-11-18 DIAGNOSIS — I10 Essential (primary) hypertension: Secondary | ICD-10-CM | POA: Diagnosis not present

## 2016-11-19 ENCOUNTER — Other Ambulatory Visit: Payer: Self-pay | Admitting: Internal Medicine

## 2016-11-19 DIAGNOSIS — Z1231 Encounter for screening mammogram for malignant neoplasm of breast: Secondary | ICD-10-CM

## 2016-11-19 IMAGING — DX DG CHEST 2V
2 series · 2 of 2 positions shown · non-contrast
Comparison: None in PACs

CLINICAL DATA: Left renal neoplasm, no chest complaints

EXAM:
CHEST  2 VIEW

[chest pa]
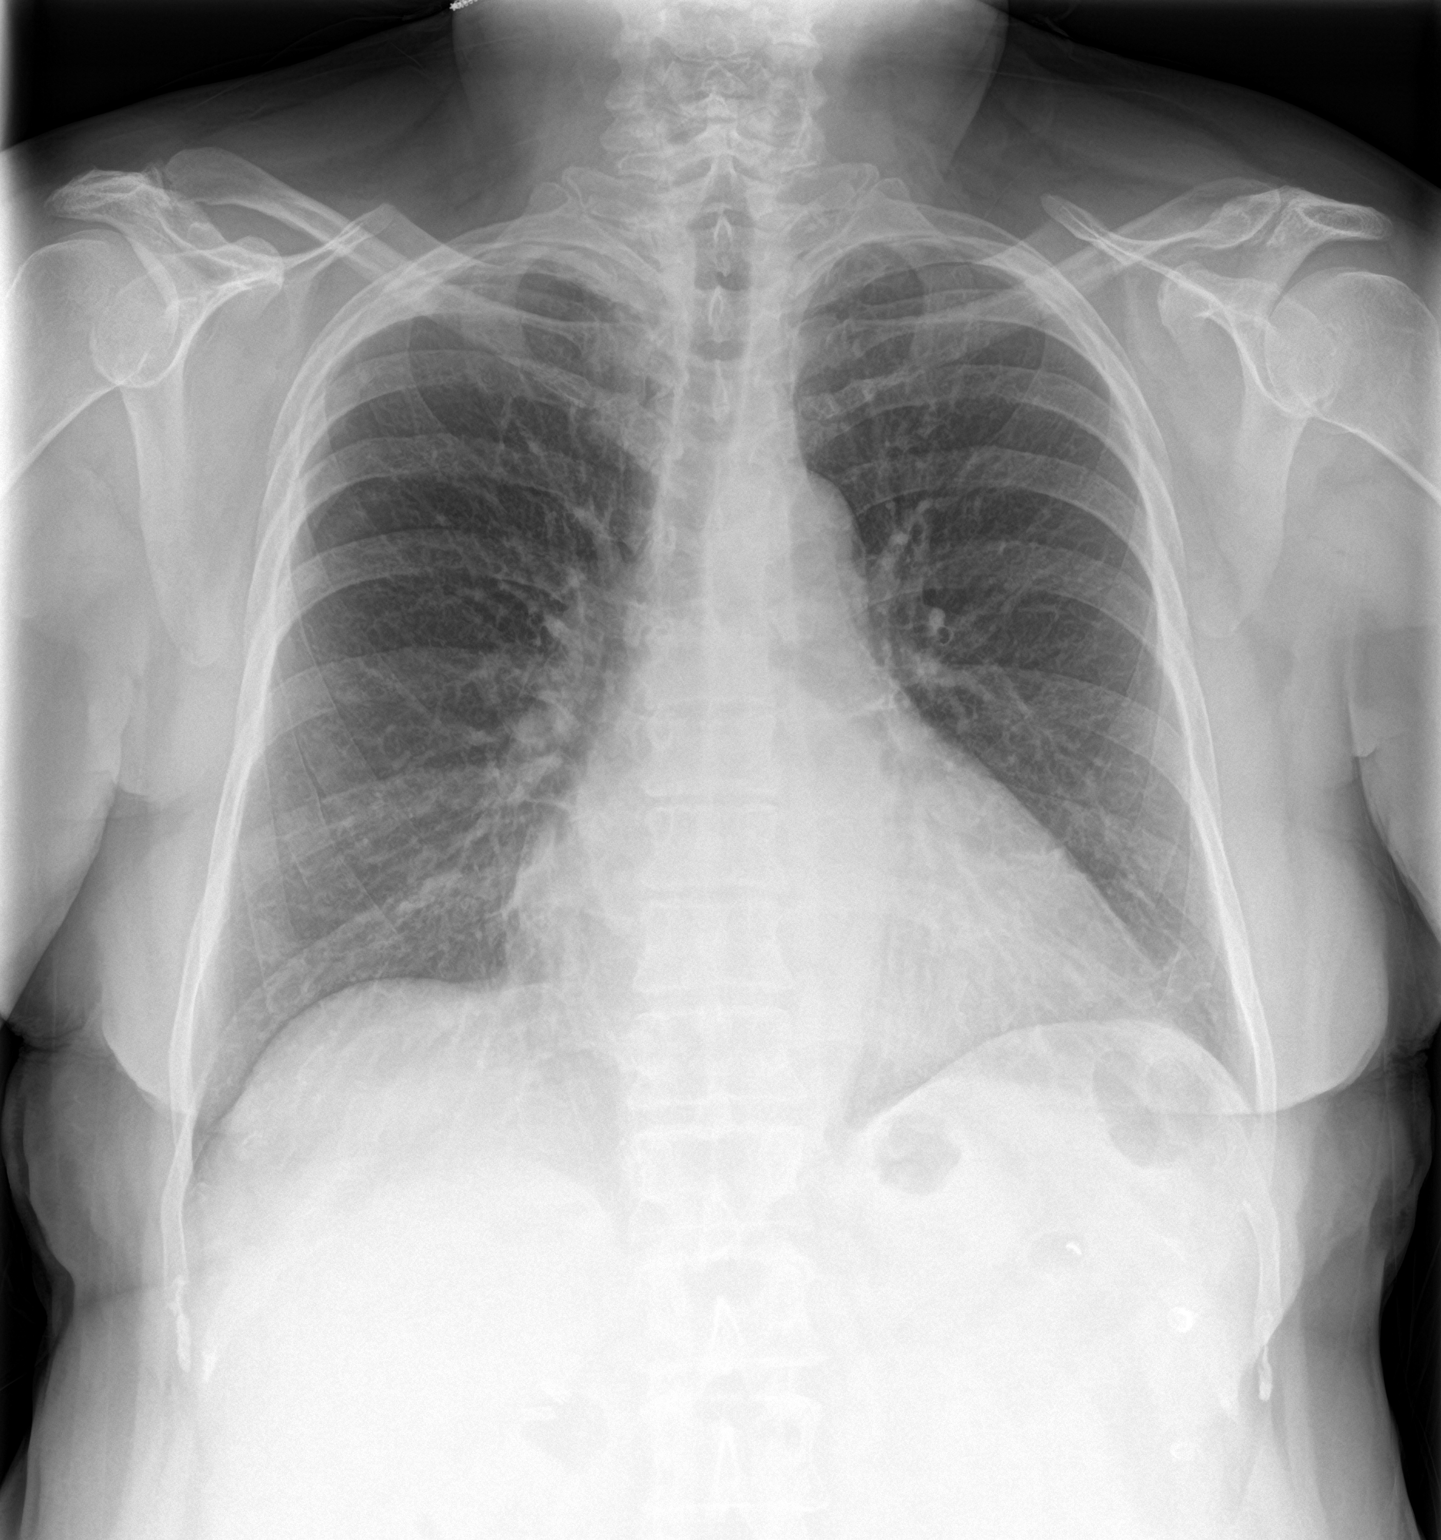

[chest lat]
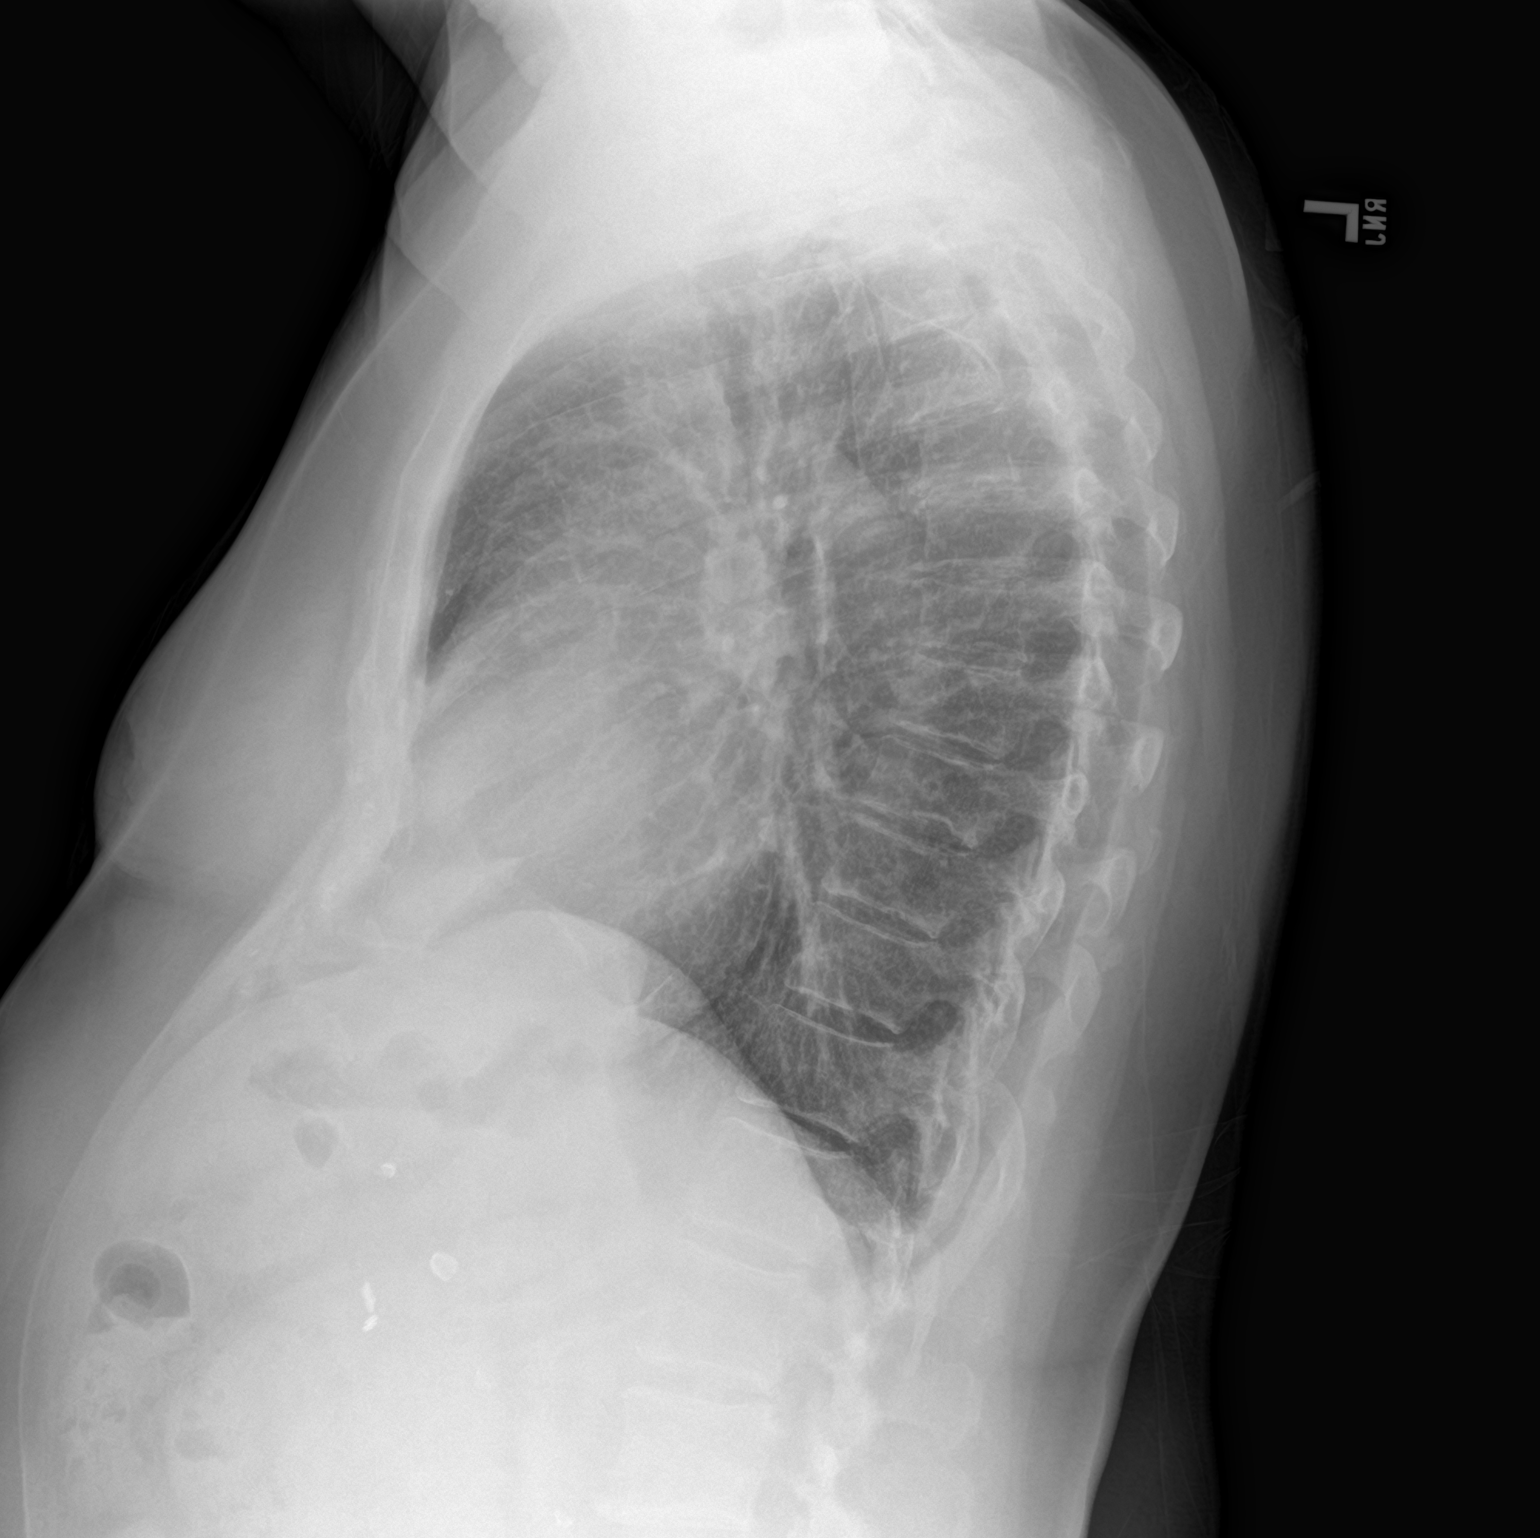

[2 of 2 positions shown; findings below may reference images not displayed]

FINDINGS: The lungs are adequately inflated and clear. There is minimal apical
pleural thickening on the right. The cardiac silhouette is enlarged.
The pulmonary vascularity is normal. There is no pleural effusion.
The bony thorax is unremarkable.
IMPRESSION: Mild cardiomegaly without evidence of CHF. There is no evidence of
metastatic disease nor other acute cardiopulmonary abnormality.

## 2016-11-25 DIAGNOSIS — J45998 Other asthma: Secondary | ICD-10-CM | POA: Diagnosis not present

## 2016-11-25 DIAGNOSIS — Z800 Family history of malignant neoplasm of digestive organs: Secondary | ICD-10-CM | POA: Diagnosis not present

## 2016-11-25 DIAGNOSIS — I10 Essential (primary) hypertension: Secondary | ICD-10-CM | POA: Diagnosis not present

## 2016-11-25 DIAGNOSIS — J301 Allergic rhinitis due to pollen: Secondary | ICD-10-CM | POA: Diagnosis not present

## 2016-11-25 DIAGNOSIS — H9120 Sudden idiopathic hearing loss, unspecified ear: Secondary | ICD-10-CM | POA: Diagnosis not present

## 2016-11-25 DIAGNOSIS — K219 Gastro-esophageal reflux disease without esophagitis: Secondary | ICD-10-CM | POA: Diagnosis not present

## 2016-11-25 DIAGNOSIS — E668 Other obesity: Secondary | ICD-10-CM | POA: Diagnosis not present

## 2016-11-25 DIAGNOSIS — Z0000 Encounter for general adult medical examination without abnormal findings: Secondary | ICD-10-CM | POA: Diagnosis not present

## 2016-11-25 DIAGNOSIS — J3089 Other allergic rhinitis: Secondary | ICD-10-CM | POA: Diagnosis not present

## 2016-11-25 DIAGNOSIS — E7849 Other hyperlipidemia: Secondary | ICD-10-CM | POA: Diagnosis not present

## 2016-11-25 DIAGNOSIS — R413 Other amnesia: Secondary | ICD-10-CM | POA: Diagnosis not present

## 2016-11-25 DIAGNOSIS — J302 Other seasonal allergic rhinitis: Secondary | ICD-10-CM | POA: Diagnosis not present

## 2016-11-26 DIAGNOSIS — Z23 Encounter for immunization: Secondary | ICD-10-CM | POA: Diagnosis not present

## 2016-12-02 DIAGNOSIS — J3089 Other allergic rhinitis: Secondary | ICD-10-CM | POA: Diagnosis not present

## 2016-12-02 DIAGNOSIS — J301 Allergic rhinitis due to pollen: Secondary | ICD-10-CM | POA: Diagnosis not present

## 2016-12-08 DIAGNOSIS — Z1212 Encounter for screening for malignant neoplasm of rectum: Secondary | ICD-10-CM | POA: Diagnosis not present

## 2016-12-11 DIAGNOSIS — J301 Allergic rhinitis due to pollen: Secondary | ICD-10-CM | POA: Diagnosis not present

## 2016-12-11 DIAGNOSIS — J3089 Other allergic rhinitis: Secondary | ICD-10-CM | POA: Diagnosis not present

## 2016-12-16 DIAGNOSIS — J3089 Other allergic rhinitis: Secondary | ICD-10-CM | POA: Diagnosis not present

## 2016-12-16 DIAGNOSIS — J301 Allergic rhinitis due to pollen: Secondary | ICD-10-CM | POA: Diagnosis not present

## 2016-12-18 IMAGING — MR MR ABDOMEN WO/W CM
14 of 18 series · 36 of 48 positions shown · IV contrast (multihance)
Comparison: CT on 11/30/2014

CLINICAL DATA: Left renal mass.

EXAM:
MRI ABDOMEN WITHOUT AND WITH CONTRAST
TECHNIQUE: Multiplanar multisequence MR imaging of the abdomen was performed
both before and after the administration of intravenous contrast.
CONTRAST:  15mL MULTIHANCE GADOBENATE DIMEGLUMINE 529 MG/ML IV SOLN

[Series 4: DWI b500 · axial · 6.0mm · 1.48mm/px · z∈[-169,+136]mm · 3 of 80 slices shown]
[im 1/80]
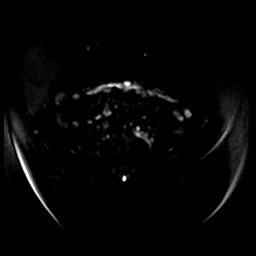
[im 40/80]
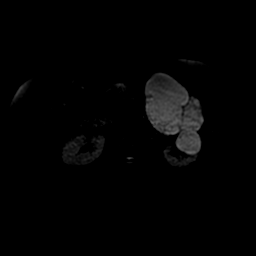
[im 80/80]
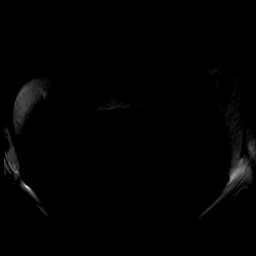

[Series 5: T2 fat-sat · axial · 5.0mm · 0.78mm/px · z∈[-170,+130]mm · 2 of 61 slices shown]
[im 1/61]
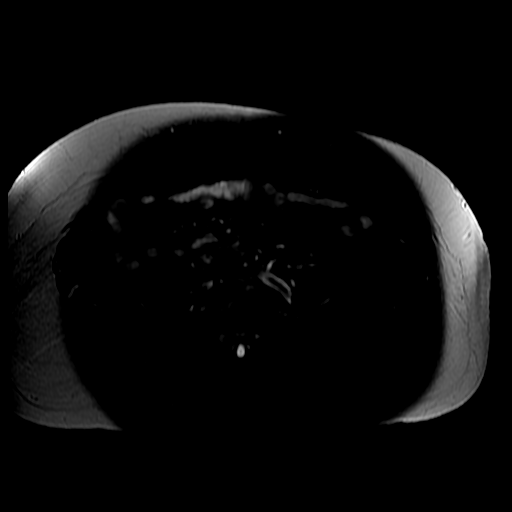
[im 61/61]
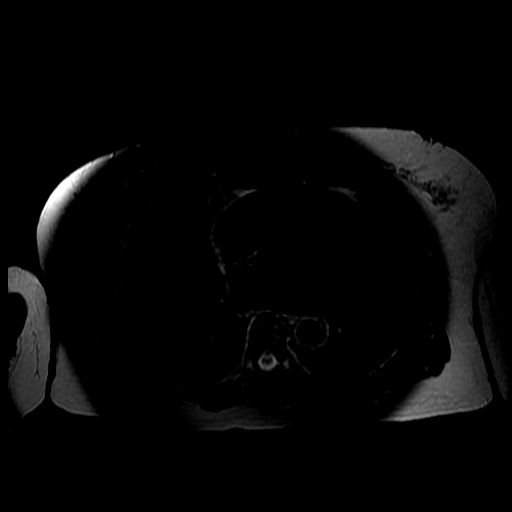

[Series 8: ax dualecho · axial · 5.0mm · 0.78mm/px · z∈[-137,+148]mm · 4 of 116 slices shown]
[im 1/116]
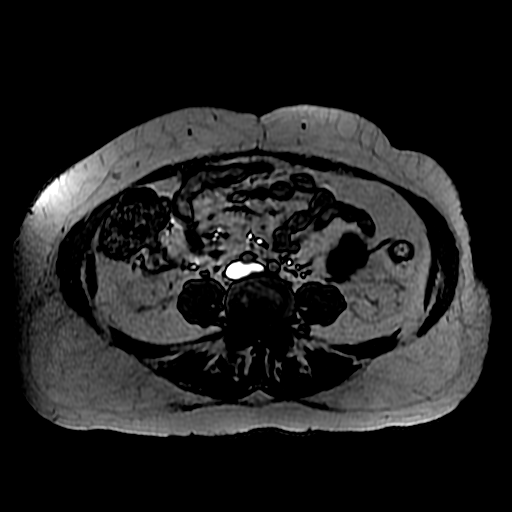
[im 39/116]
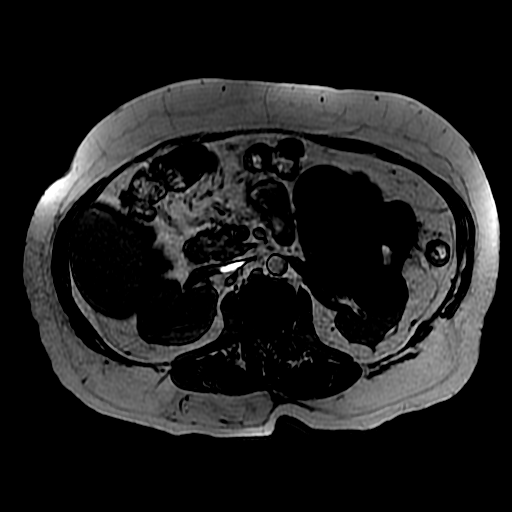
[im 77/116]
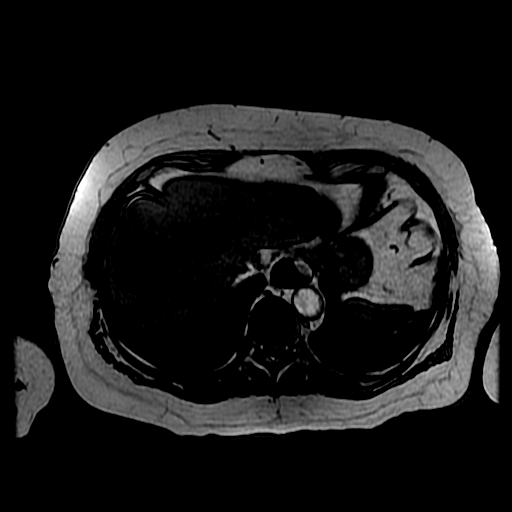
[im 116/116]
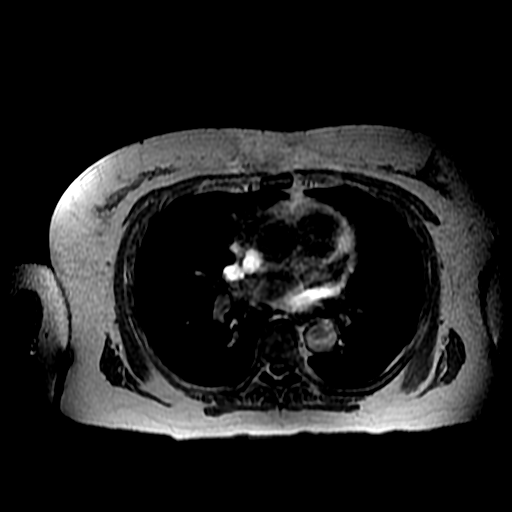

[Series 9: T2 · axial · 5.0mm · 0.78mm/px · z∈[-137,+148]mm · 2 of 58 slices shown (1 of 2)]
[im 1/58]
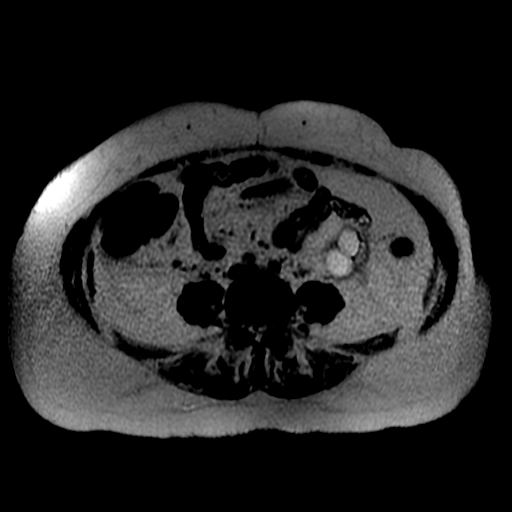
[im 58/58]
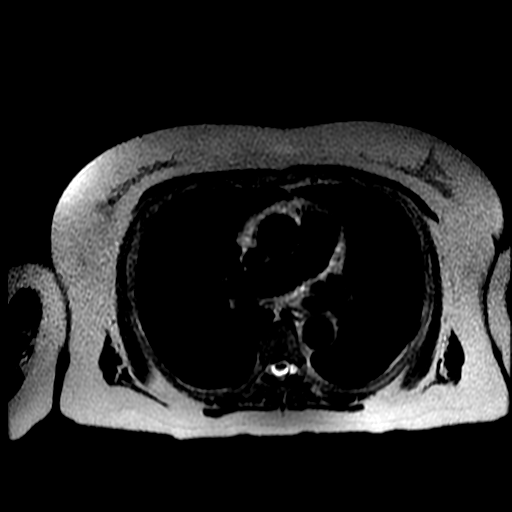

[Series 10: T2 · coronal · 5.0mm · 0.78mm/px · 1 of 37 slices shown (2 of 2)]
[im 1/37]
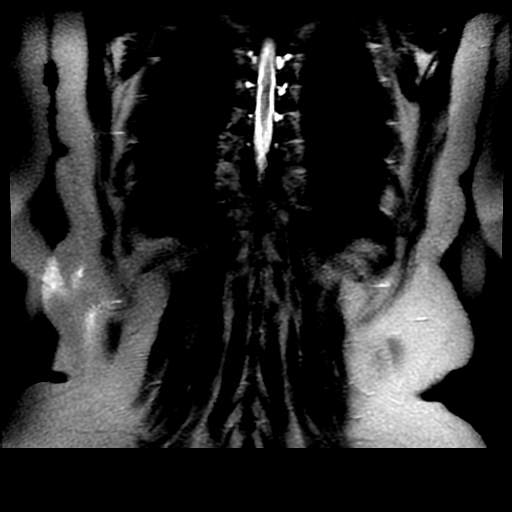

[Series 12: bSSFP · axial · 5.0mm · 0.78mm/px · z∈[-137,+148]mm · 2 of 58 slices shown]
[im 1/58]
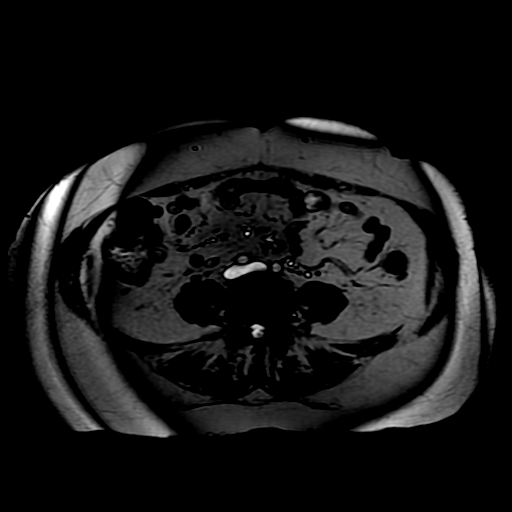
[im 58/58]
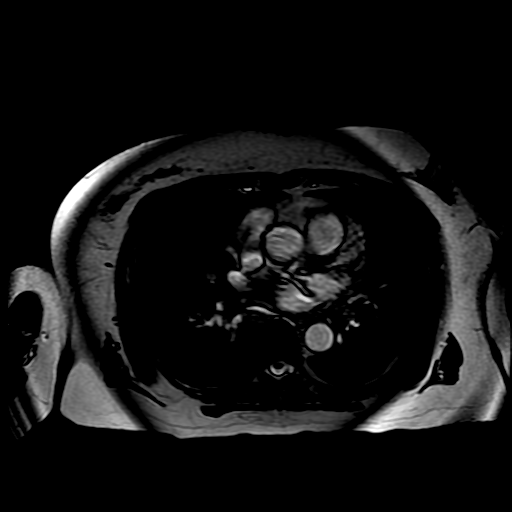

[Series 400: DWI · axial · 6.0mm · 1.48mm/px · 1 of 40 slices shown]
[im 1/40]
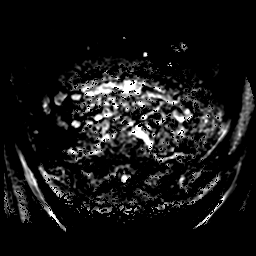

[Series 1300: T1 dynamic · axial · 5.0mm · 1.56mm/px · z∈[-150,+100]mm · 3 of 101 slices shown (1 of 6)]
[im 1/101]
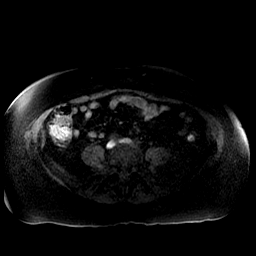
[im 51/101]
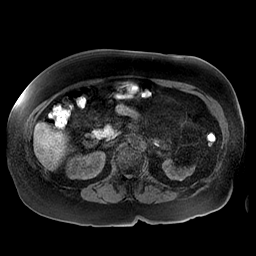
[im 101/101]
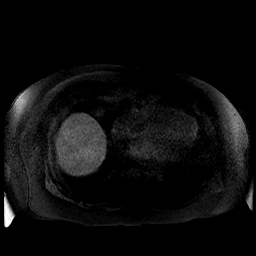

[Series 1301: T1 dynamic · axial · 5.0mm · 1.56mm/px · z∈[-150,+100]mm · 3 of 101 slices shown (2 of 6)]
[im 1/101]
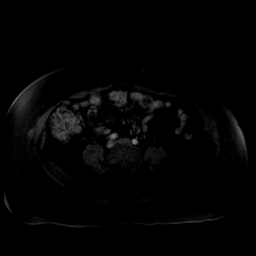
[im 51/101]
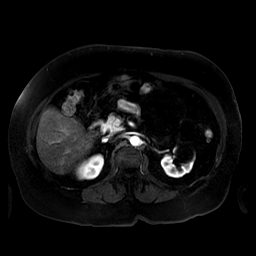
[im 101/101]
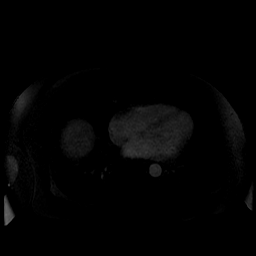

[Series 1302: T1 dynamic · axial · 5.0mm · 1.56mm/px · z∈[-150,+100]mm · 3 of 101 slices shown (3 of 6)]
[im 1/101]
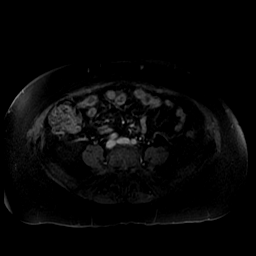
[im 51/101]
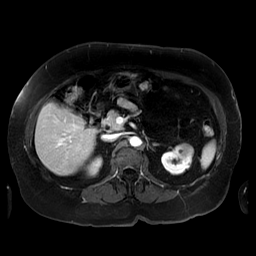
[im 101/101]
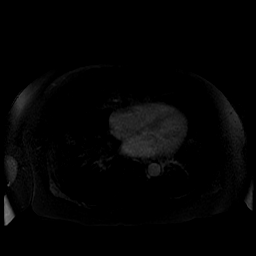

[Series 1303: T1 dynamic · axial · 5.0mm · 1.56mm/px · z∈[-150,+100]mm · 3 of 101 slices shown (4 of 6)]
[im 1/101]
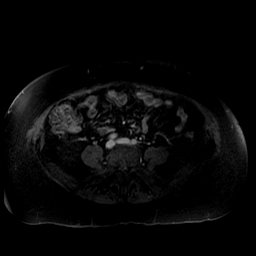
[im 51/101]
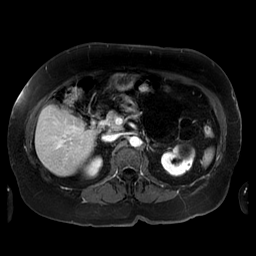
[im 101/101]
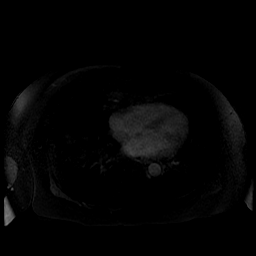

[Series 1304: T1 dynamic · axial · 5.0mm · 1.56mm/px · z∈[-150,+100]mm · 3 of 101 slices shown (5 of 6)]
[im 1/101]
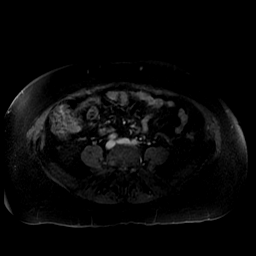
[im 51/101]
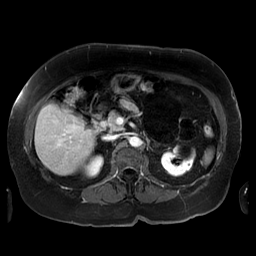
[im 101/101]
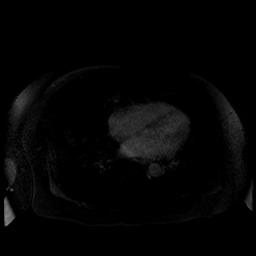

[Series 1305: T1 dynamic · axial · 5.0mm · 1.56mm/px · z∈[-150,+100]mm · 3 of 101 slices shown (6 of 6)]
[im 1/101]
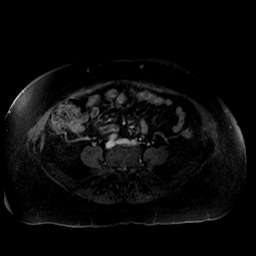
[im 51/101]
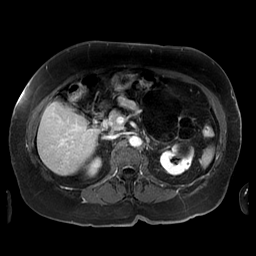
[im 101/101]
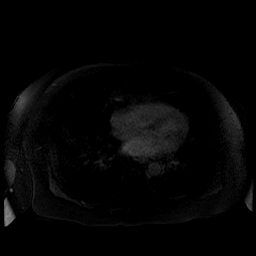

[((id)/(id)/1)-((id)/(id)/1) · axial · 5.0mm · 1.56mm/px · z∈[-150,+100]mm · 3 of 101 slices shown]
[im 1/101]
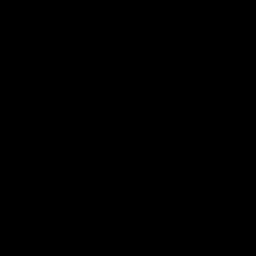
[im 51/101]
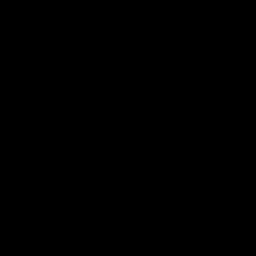
[im 101/101]
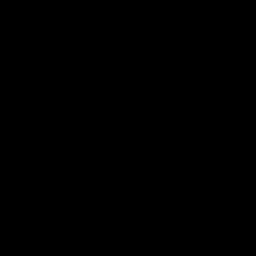

[36 of 48 positions shown; findings below may reference images not displayed]

FINDINGS: Lower chest:  No acute findings.

Hepatobiliary: Mild hepatic steatosis is demonstrated, however no
liver masses are identified. Prior cholecystectomy noted. No
evidence of biliary dilatation.

Pancreas: No mass, inflammatory changes, or other parenchymal
abnormality identified.

Spleen:  Within normal limits in size and appearance.

Adrenals/Urinary Tract: No adrenal masses are identified. The right
kidney is normal in appearance. A large complex cystic mass is seen
arising from the anterior midpole of the left kidney which measures
13.9 by 11.7 x 10.7 cm. This mass contains numerous internal
septations, with evidence of contrast enhancement of some thickened
septations and small nodules on subtraction imaging, consistent with
Bosniak category 4 lesion. Differential diagnosis includes
multilocular cystic nephroma and cystic renal cell carcinoma. A tiny
sub-cm simple cyst is noted in the posterior midpole of the left
kidney. No evidence of hydronephrosis.

Stomach/Bowel: Tiny hiatal hernia noted. Visualized portions within
the abdomen are unremarkable. Diverticulosis seen involving
visualized portion of descending colon.

Vascular/Lymphatic: No pathologically enlarged lymph nodes
identified. No abdominal aortic aneurysm demonstrated.

Other:  None.

Musculoskeletal:  No suspicious bone lesions identified.
IMPRESSION: Large complex cystic and solid Bosniak category 4 lesion involving
the left kidney. Differential diagnosis includes cystic renal cell
carcinoma in multilocular cystic nephroma. Surgical evaluation is
recommended.

No evidence of abdominal metastatic disease.

Mild hepatic steatosis and tiny hiatal hernia.

## 2016-12-23 DIAGNOSIS — J301 Allergic rhinitis due to pollen: Secondary | ICD-10-CM | POA: Diagnosis not present

## 2016-12-23 DIAGNOSIS — J3089 Other allergic rhinitis: Secondary | ICD-10-CM | POA: Diagnosis not present

## 2016-12-24 DIAGNOSIS — J301 Allergic rhinitis due to pollen: Secondary | ICD-10-CM | POA: Diagnosis not present

## 2016-12-24 DIAGNOSIS — J3089 Other allergic rhinitis: Secondary | ICD-10-CM | POA: Diagnosis not present

## 2017-01-01 ENCOUNTER — Ambulatory Visit
Admission: RE | Admit: 2017-01-01 | Discharge: 2017-01-01 | Disposition: A | Discharge status: Home / Self Care | Payer: Medicare HMO | Source: Ambulatory Visit | Attending: Internal Medicine | Admitting: Internal Medicine

## 2017-01-01 DIAGNOSIS — Z1231 Encounter for screening mammogram for malignant neoplasm of breast: Secondary | ICD-10-CM | POA: Diagnosis not present

## 2017-01-01 DIAGNOSIS — J301 Allergic rhinitis due to pollen: Secondary | ICD-10-CM | POA: Diagnosis not present

## 2017-01-01 DIAGNOSIS — J3089 Other allergic rhinitis: Secondary | ICD-10-CM | POA: Diagnosis not present

## 2017-01-06 DIAGNOSIS — J3089 Other allergic rhinitis: Secondary | ICD-10-CM | POA: Diagnosis not present

## 2017-01-06 DIAGNOSIS — J301 Allergic rhinitis due to pollen: Secondary | ICD-10-CM | POA: Diagnosis not present

## 2017-01-08 DIAGNOSIS — J3089 Other allergic rhinitis: Secondary | ICD-10-CM | POA: Diagnosis not present

## 2017-01-08 DIAGNOSIS — J301 Allergic rhinitis due to pollen: Secondary | ICD-10-CM | POA: Diagnosis not present

## 2017-01-13 DIAGNOSIS — J301 Allergic rhinitis due to pollen: Secondary | ICD-10-CM | POA: Diagnosis not present

## 2017-01-13 DIAGNOSIS — J3089 Other allergic rhinitis: Secondary | ICD-10-CM | POA: Diagnosis not present

## 2017-01-15 DIAGNOSIS — J3089 Other allergic rhinitis: Secondary | ICD-10-CM | POA: Diagnosis not present

## 2017-01-15 DIAGNOSIS — J301 Allergic rhinitis due to pollen: Secondary | ICD-10-CM | POA: Diagnosis not present

## 2017-01-19 DIAGNOSIS — J301 Allergic rhinitis due to pollen: Secondary | ICD-10-CM | POA: Diagnosis not present

## 2017-01-19 DIAGNOSIS — J3089 Other allergic rhinitis: Secondary | ICD-10-CM | POA: Diagnosis not present

## 2017-01-21 DIAGNOSIS — J3089 Other allergic rhinitis: Secondary | ICD-10-CM | POA: Diagnosis not present

## 2017-01-21 DIAGNOSIS — E7849 Other hyperlipidemia: Secondary | ICD-10-CM | POA: Diagnosis not present

## 2017-01-21 DIAGNOSIS — J301 Allergic rhinitis due to pollen: Secondary | ICD-10-CM | POA: Diagnosis not present

## 2017-01-21 DIAGNOSIS — E669 Obesity, unspecified: Secondary | ICD-10-CM | POA: Diagnosis not present

## 2017-01-21 DIAGNOSIS — I10 Essential (primary) hypertension: Secondary | ICD-10-CM | POA: Diagnosis not present

## 2017-01-21 DIAGNOSIS — Z6837 Body mass index (BMI) 37.0-37.9, adult: Secondary | ICD-10-CM | POA: Diagnosis not present

## 2017-01-21 DIAGNOSIS — Z23 Encounter for immunization: Secondary | ICD-10-CM | POA: Diagnosis not present

## 2017-01-27 DIAGNOSIS — J301 Allergic rhinitis due to pollen: Secondary | ICD-10-CM | POA: Diagnosis not present

## 2017-01-27 DIAGNOSIS — J3089 Other allergic rhinitis: Secondary | ICD-10-CM | POA: Diagnosis not present

## 2017-01-29 DIAGNOSIS — J301 Allergic rhinitis due to pollen: Secondary | ICD-10-CM | POA: Diagnosis not present

## 2017-01-29 DIAGNOSIS — J3089 Other allergic rhinitis: Secondary | ICD-10-CM | POA: Diagnosis not present

## 2017-02-04 DIAGNOSIS — J3089 Other allergic rhinitis: Secondary | ICD-10-CM | POA: Diagnosis not present

## 2017-02-04 DIAGNOSIS — J301 Allergic rhinitis due to pollen: Secondary | ICD-10-CM | POA: Diagnosis not present

## 2017-02-11 DIAGNOSIS — J3089 Other allergic rhinitis: Secondary | ICD-10-CM | POA: Diagnosis not present

## 2017-02-11 DIAGNOSIS — J301 Allergic rhinitis due to pollen: Secondary | ICD-10-CM | POA: Diagnosis not present

## 2017-02-15 DIAGNOSIS — Z800 Family history of malignant neoplasm of digestive organs: Secondary | ICD-10-CM | POA: Diagnosis not present

## 2017-02-15 DIAGNOSIS — Z86010 Personal history of colonic polyps: Secondary | ICD-10-CM | POA: Diagnosis not present

## 2017-02-15 DIAGNOSIS — D126 Benign neoplasm of colon, unspecified: Secondary | ICD-10-CM | POA: Diagnosis not present

## 2017-02-15 DIAGNOSIS — K5730 Diverticulosis of large intestine without perforation or abscess without bleeding: Secondary | ICD-10-CM | POA: Diagnosis not present

## 2017-02-18 DIAGNOSIS — D126 Benign neoplasm of colon, unspecified: Secondary | ICD-10-CM | POA: Diagnosis not present

## 2017-02-19 DIAGNOSIS — J3089 Other allergic rhinitis: Secondary | ICD-10-CM | POA: Diagnosis not present

## 2017-02-19 DIAGNOSIS — J301 Allergic rhinitis due to pollen: Secondary | ICD-10-CM | POA: Diagnosis not present

## 2017-02-25 DIAGNOSIS — J3089 Other allergic rhinitis: Secondary | ICD-10-CM | POA: Diagnosis not present

## 2017-02-25 DIAGNOSIS — J301 Allergic rhinitis due to pollen: Secondary | ICD-10-CM | POA: Diagnosis not present

## 2017-02-28 IMAGING — CT CT ABD-PELV W/ CM
2 of 5 series · 15 of 46 positions shown, 17 images · IV contrast (APPLIED)
Comparison: None.

CLINICAL DATA: Right upper quadrant mass felt on physical
examination. Occasional constipation. No trauma. History of a
hysterectomy and bilateral salpingo-oophorectomy as well as a
cholecystectomy.

EXAM:
CT ABDOMEN AND PELVIS WITH CONTRAST
TECHNIQUE: Multidetector CT imaging of the abdomen and pelvis was performed
using the standard protocol following bolus administration of
intravenous contrast.
CONTRAST:  100mL FRM2FT-NLL IOPAMIDOL (FRM2FT-NLL) INJECTION 61%

[Series 2: abd/pelvis w/cm · axial · 0.77mm/px · z∈[-405,+20]mm · 12 of 96 slices shown, 14 images]
[im 6/96  soft-tissue]
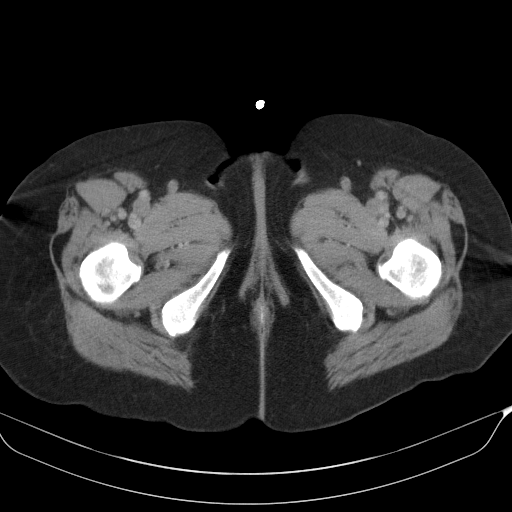
[im 6/96  bone]
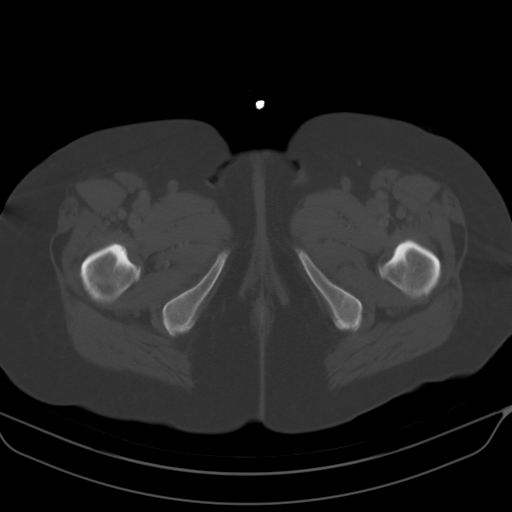
[im 16/96  soft-tissue]
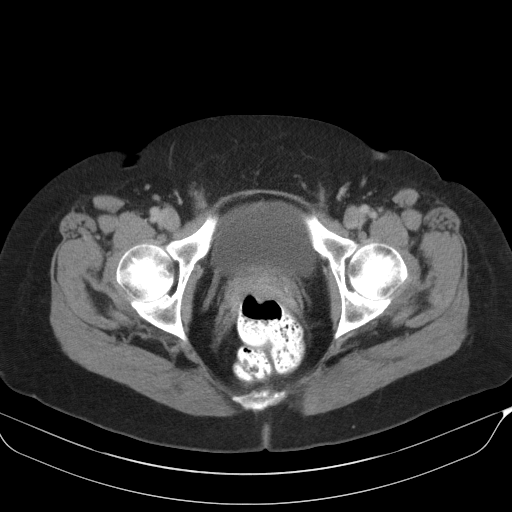
[im 21/96  soft-tissue]
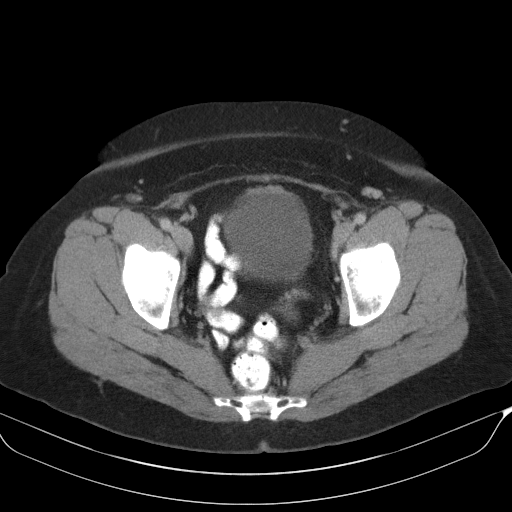
[im 31/96  soft-tissue]
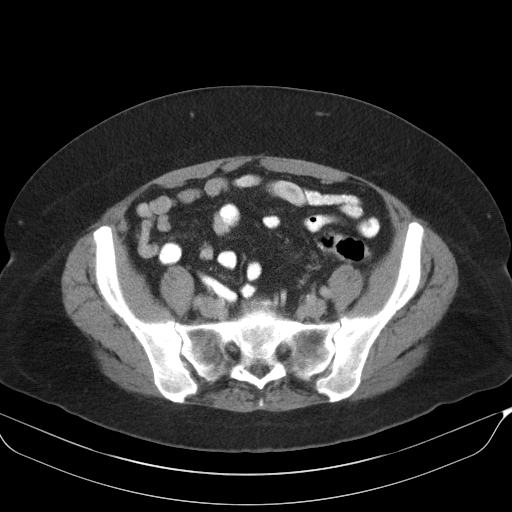
[im 36/96  soft-tissue]
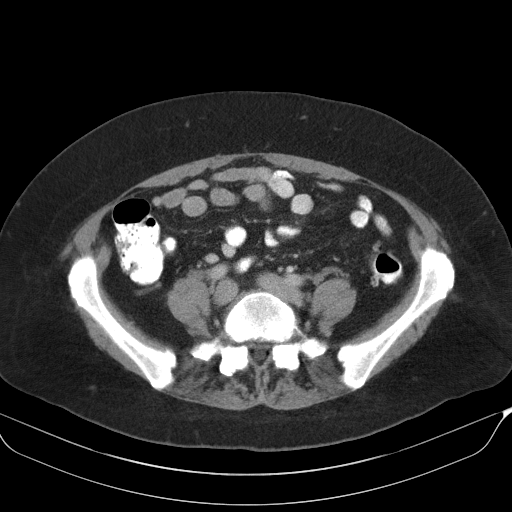
[im 46/96  soft-tissue]
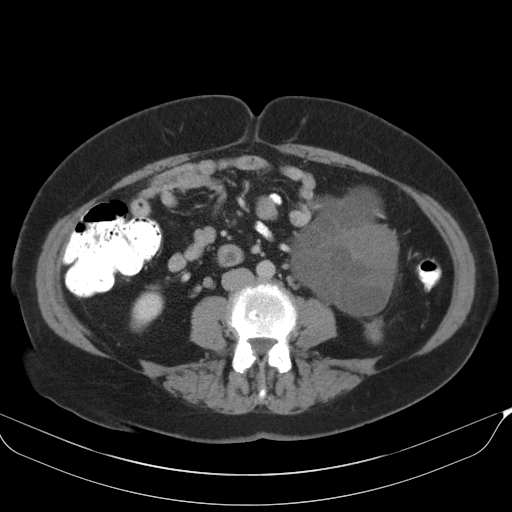
[im 51/96  soft-tissue]
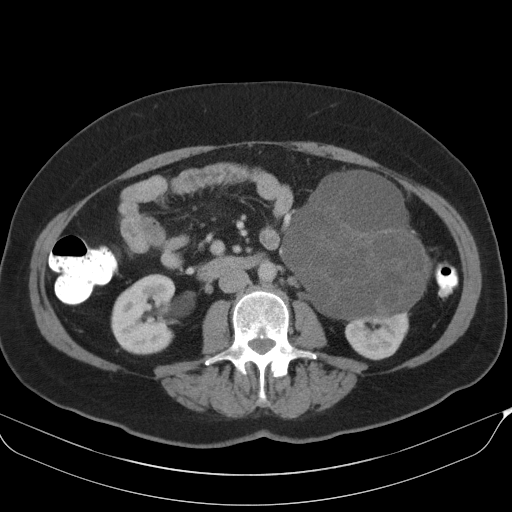
[im 61/96  soft-tissue]
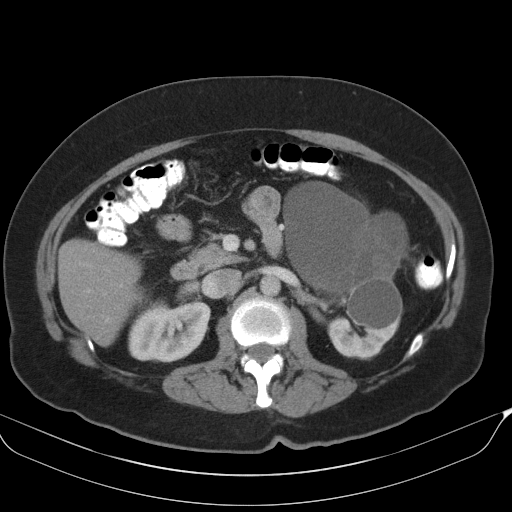
[im 66/96  soft-tissue]
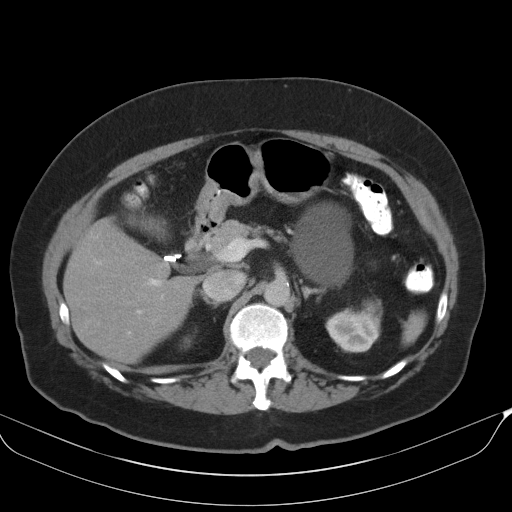
[im 66/96  bone]
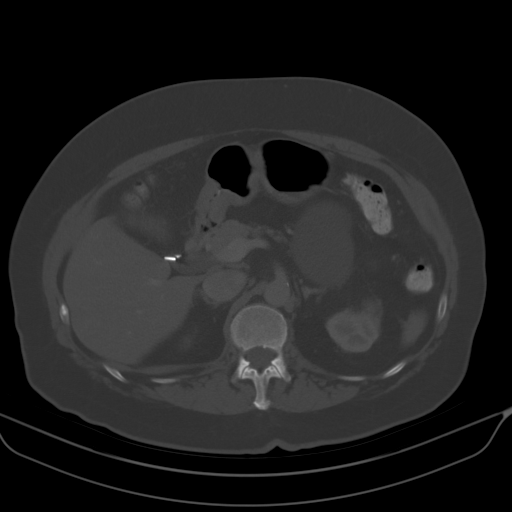
[im 76/96  soft-tissue]
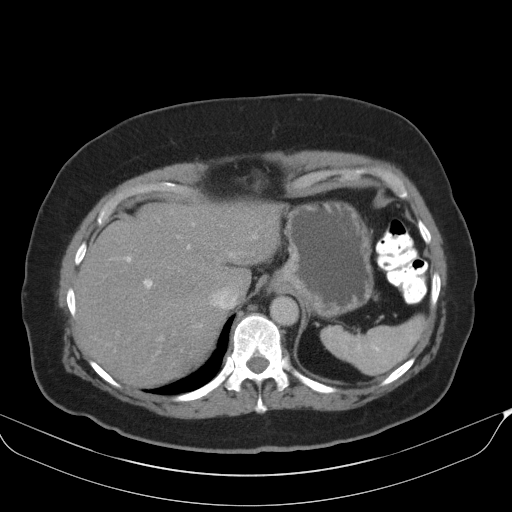
[im 81/96  soft-tissue]
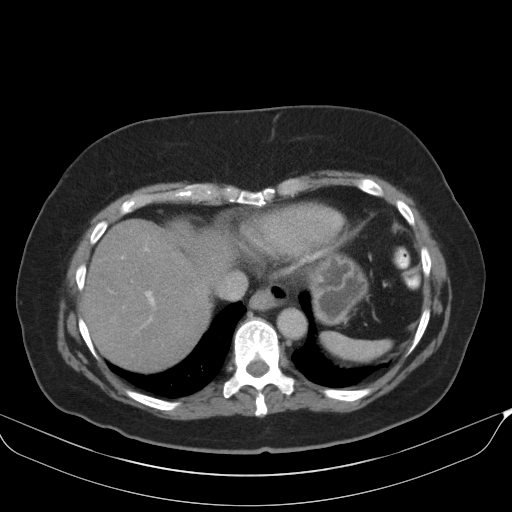
[im 91/96  soft-tissue]
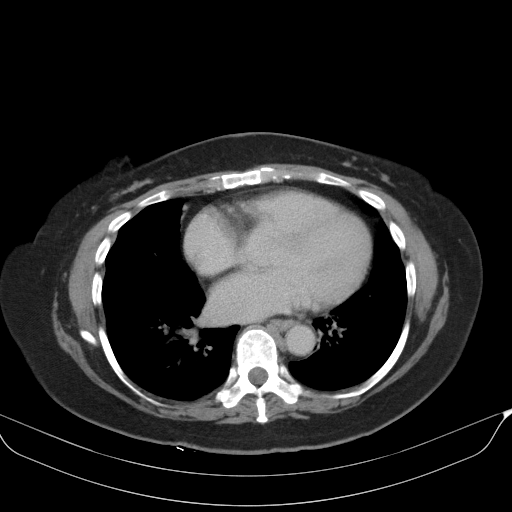

[Series 3: cor · coronal · 0.80mm/px · 3 of 91 slices shown]
[im 31/91  soft-tissue]
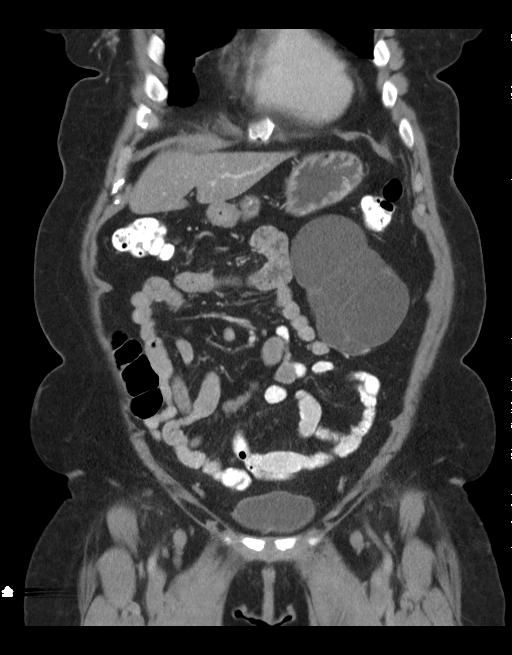
[im 41/91  soft-tissue]
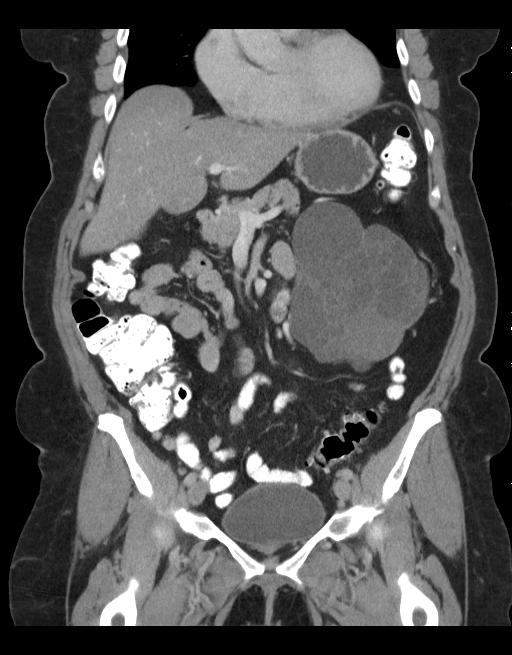
[im 51/91  soft-tissue]
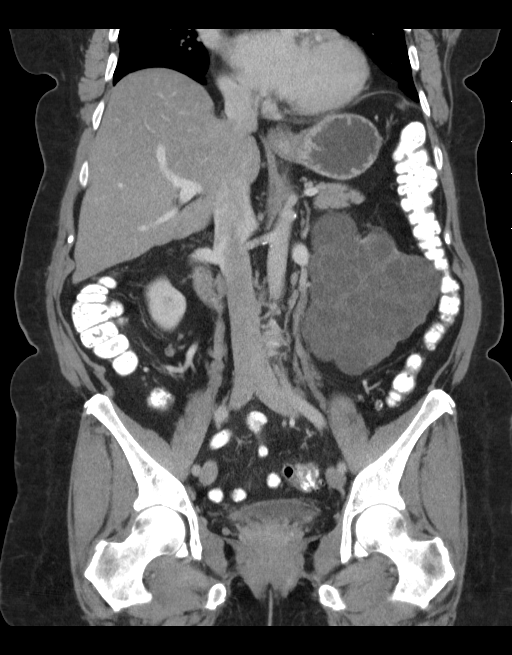

[15 of 46 positions shown; findings below may reference images not displayed]

FINDINGS: Lung bases: Mild subsegmental atelectasis and mild prominence of the
lower lobe bronchial walls. No evidence of pneumonia or edema. Heart
is mildly enlarged.

Liver: Fatty infiltration. No mass or focal lesion. Normal in size.

Liver, pancreas, adrenal glands:  Normal.

Gallbladder surgically absent.  No bile duct dilation.

Kidneys, ureters, bladder: There is a large heterogeneous left renal
mass protruding from the anterior midpole, measuring 14.4 x 10.1 x
11.9 cm. Areas of lower attenuation with intervening areas of higher
attenuation consistent with a cystic and solid mass. The mass
displaces adjacent structures. It is likely contained within
Gerota's fascia. The renal vein on the left is widely patent. There
is an adjacent 3. 8 cm simple appearing cyst in the midpole the left
kidney. No other left renal masses. No right renal masses. No
stones. No hydronephrosis. Ureters are normal course and in caliber.
Bladder is unremarkable.

Uterus and adnexa: Uterus surgically absent.  No pelvic masses.

Lymph nodes:  No adenopathy.

Ascites:  None.

Gastrointestinal: Scattered colonic diverticula. No colonic wall
thickening or inflammation. No bowel dilation. Normal small bowel
and stomach. Normal appendix visualized.

Musculoskeletal: Degenerative changes of the lumbar spine most
evident at L5-S1. No osteoblastic or osteolytic lesions.
IMPRESSION: 1. Large, 14.4 cm, cystic and solid left renal mass. There are solid
enhancing components of this mass making it a Bosniak category 4
lesion, warranting surgical consultation. This is suspicious for a
cystic and solid renal cell carcinoma.
2. Simple appearing left renal cyst.
3. No acute findings.
4. Status post cholecystectomy and hysterectomy. Colonic diverticula
without diverticulitis. Degenerative changes of the lower lumbar
spine.

## 2017-03-05 DIAGNOSIS — J3089 Other allergic rhinitis: Secondary | ICD-10-CM | POA: Diagnosis not present

## 2017-03-05 DIAGNOSIS — J301 Allergic rhinitis due to pollen: Secondary | ICD-10-CM | POA: Diagnosis not present

## 2017-03-10 DIAGNOSIS — J301 Allergic rhinitis due to pollen: Secondary | ICD-10-CM | POA: Diagnosis not present

## 2017-03-10 DIAGNOSIS — J3089 Other allergic rhinitis: Secondary | ICD-10-CM | POA: Diagnosis not present

## 2017-03-16 DIAGNOSIS — J3089 Other allergic rhinitis: Secondary | ICD-10-CM | POA: Diagnosis not present

## 2017-03-16 DIAGNOSIS — J301 Allergic rhinitis due to pollen: Secondary | ICD-10-CM | POA: Diagnosis not present

## 2017-03-23 DIAGNOSIS — J3089 Other allergic rhinitis: Secondary | ICD-10-CM | POA: Diagnosis not present

## 2017-03-23 DIAGNOSIS — J301 Allergic rhinitis due to pollen: Secondary | ICD-10-CM | POA: Diagnosis not present

## 2017-03-26 IMAGING — MG MM SCREENING BREAST TOMO BILATERAL
8 series · 9 of 24 positions shown · non-contrast
Comparison: Previous exam(s).

CLINICAL DATA: Screening.

EXAM:
DIGITAL SCREENING BILATERAL MAMMOGRAM WITH 3D TOMO WITH CAD

[L CC]
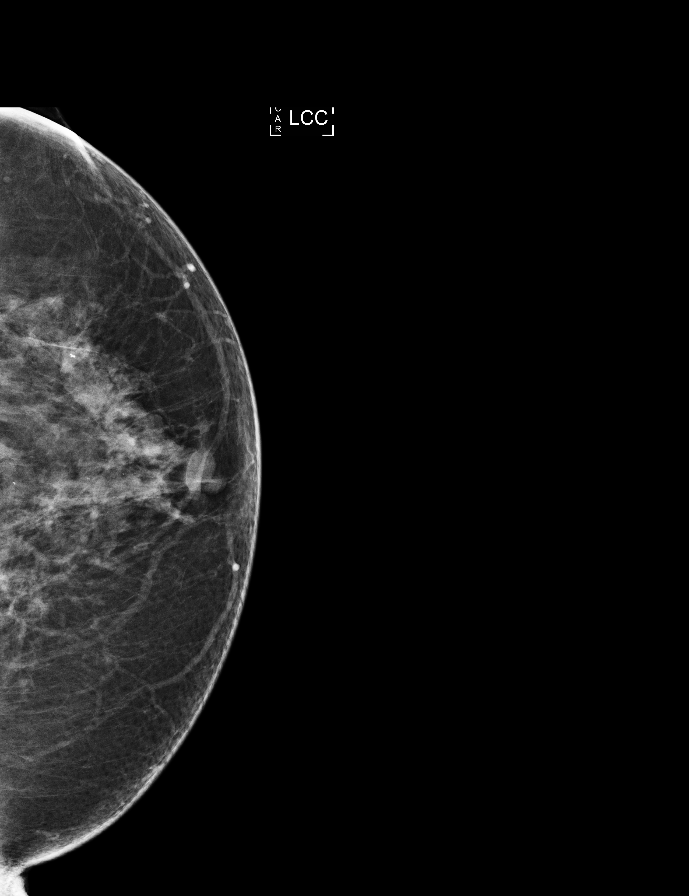

[R CC]
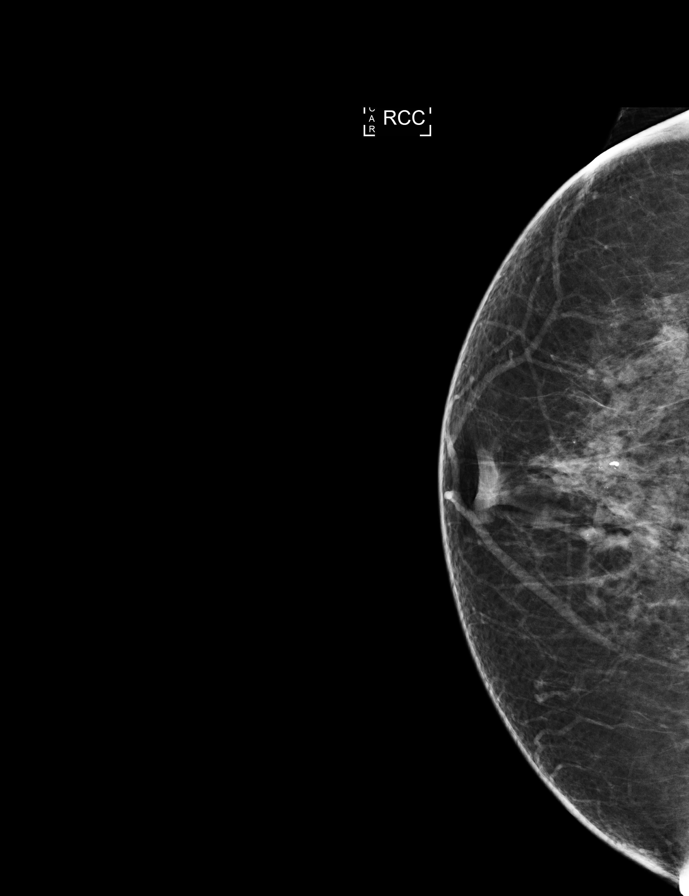

[R MLO]
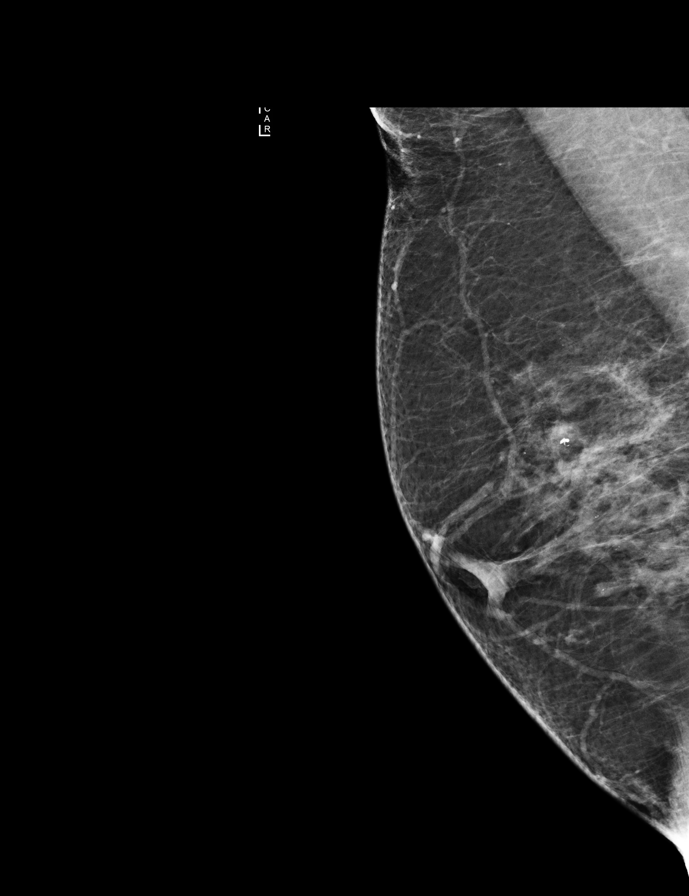

[L MLO]
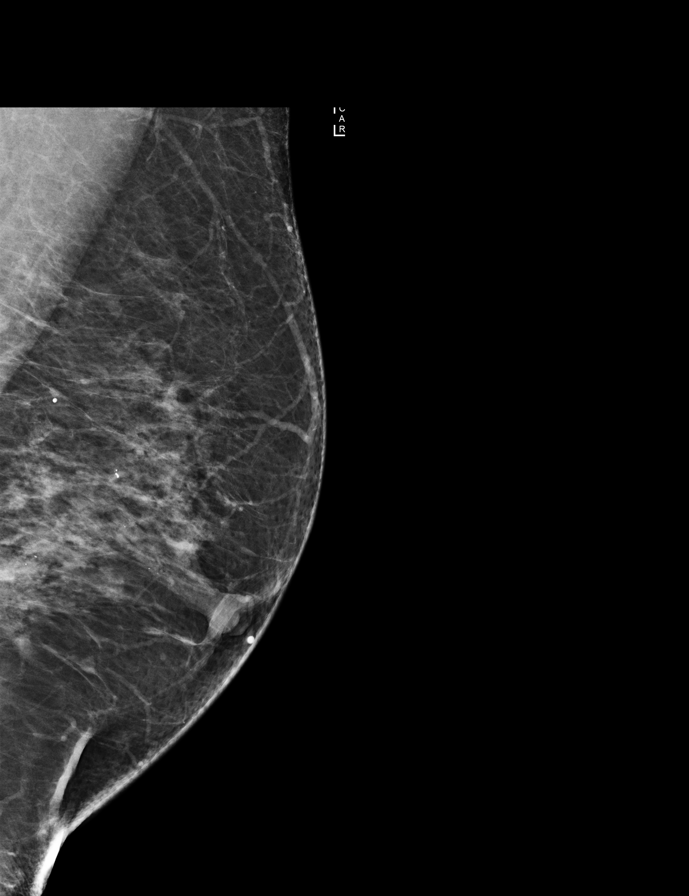

[L MLO tomo · 2 of 77 frames shown]
[frame 25/77]
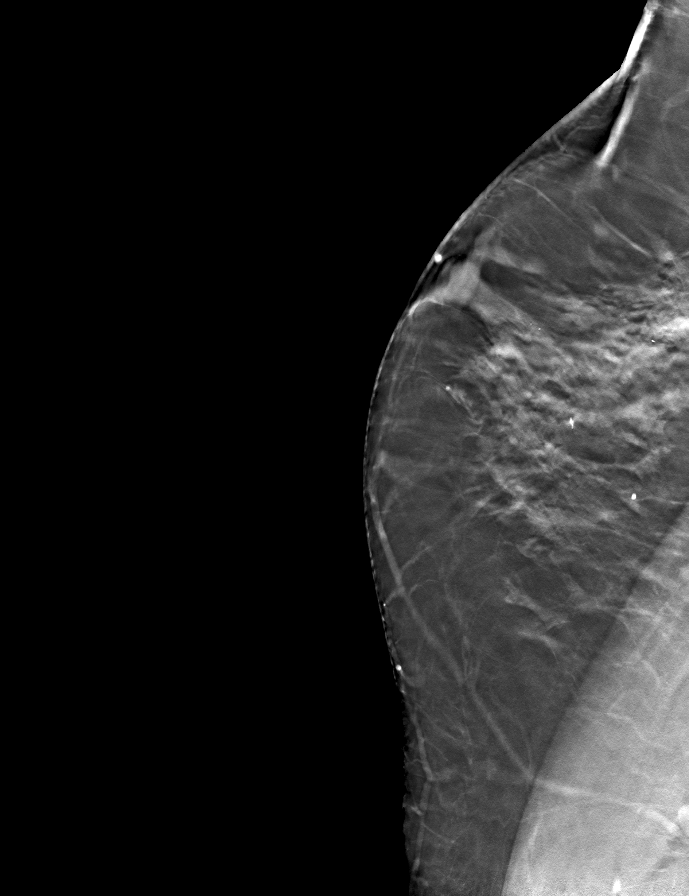
[frame 39/77]
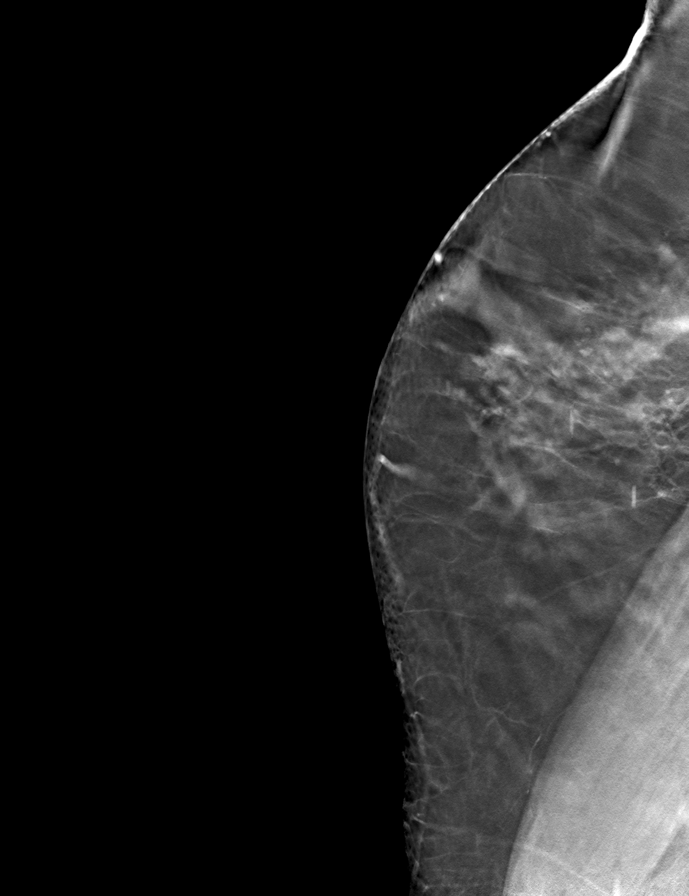

[L CC tomo · tomo slice 37/73.0]
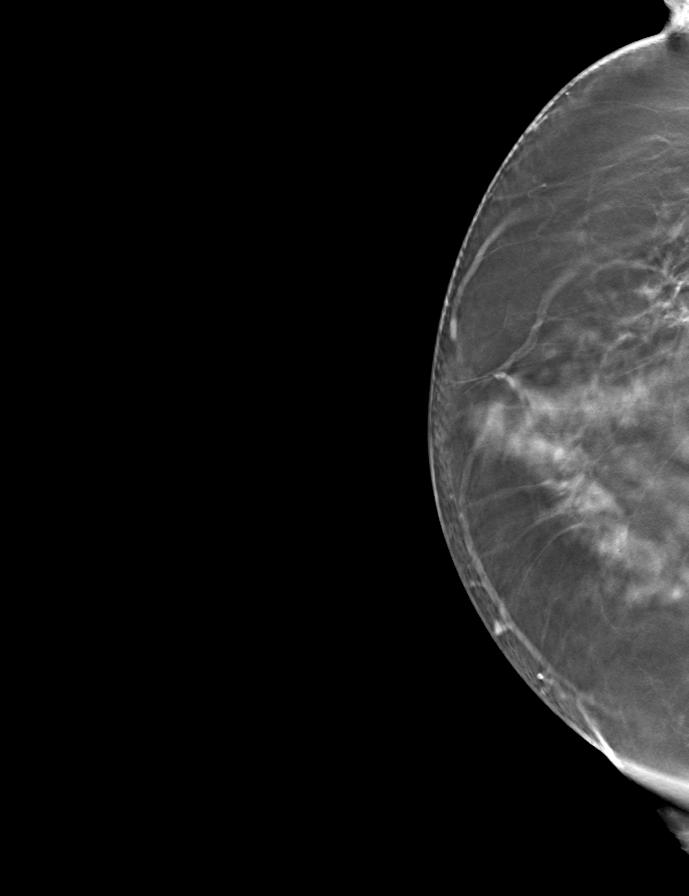

[R CC tomo · tomo slice 38/75.0]
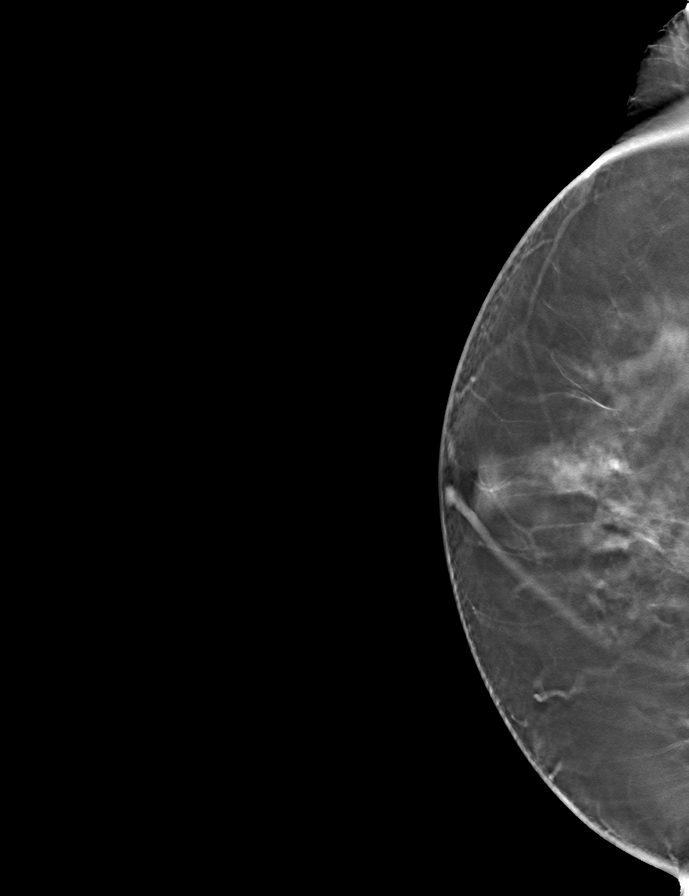

[R MLO tomo · tomo slice 38/75.0]
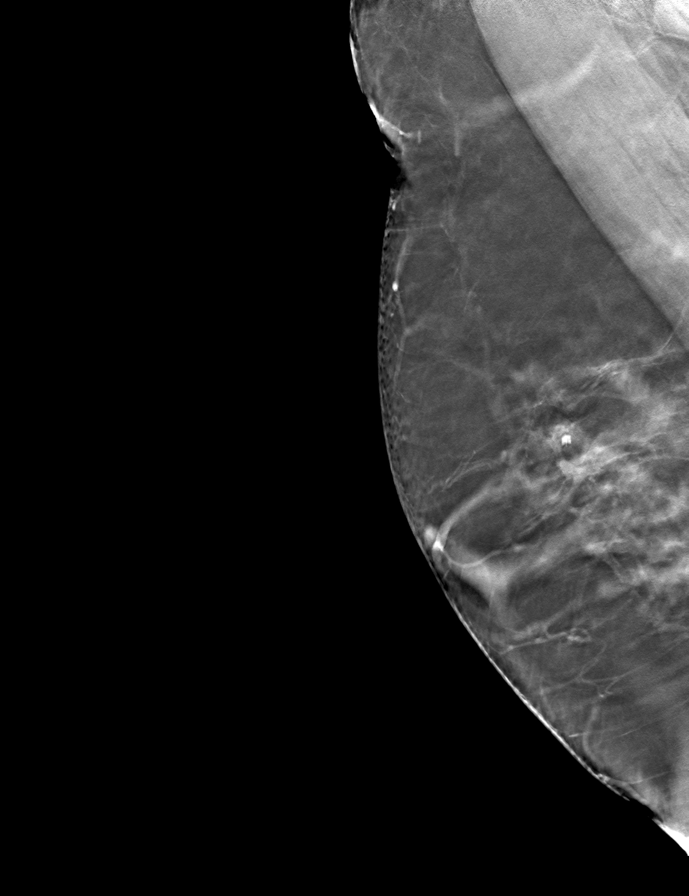

[9 of 24 positions shown; findings below may reference images not displayed]

ACR Breast Density Category c: The breast tissue is heterogeneously
dense, which may obscure small masses.
FINDINGS: There are no findings suspicious for malignancy. Images were
processed with CAD.
IMPRESSION: No mammographic evidence of malignancy. A result letter of this
screening mammogram will be mailed directly to the patient.

RECOMMENDATION:
Screening mammogram in one year. (Code:OA-G-1SS)

BI-RADS CATEGORY  1: Negative.

## 2017-04-01 DIAGNOSIS — J301 Allergic rhinitis due to pollen: Secondary | ICD-10-CM | POA: Diagnosis not present

## 2017-04-01 DIAGNOSIS — J3089 Other allergic rhinitis: Secondary | ICD-10-CM | POA: Diagnosis not present

## 2017-04-09 DIAGNOSIS — J3089 Other allergic rhinitis: Secondary | ICD-10-CM | POA: Diagnosis not present

## 2017-04-09 DIAGNOSIS — J4530 Mild persistent asthma, uncomplicated: Secondary | ICD-10-CM | POA: Diagnosis not present

## 2017-04-09 DIAGNOSIS — H1045 Other chronic allergic conjunctivitis: Secondary | ICD-10-CM | POA: Diagnosis not present

## 2017-04-09 DIAGNOSIS — J301 Allergic rhinitis due to pollen: Secondary | ICD-10-CM | POA: Diagnosis not present

## 2017-04-20 DIAGNOSIS — J301 Allergic rhinitis due to pollen: Secondary | ICD-10-CM | POA: Diagnosis not present

## 2017-04-20 DIAGNOSIS — J3089 Other allergic rhinitis: Secondary | ICD-10-CM | POA: Diagnosis not present

## 2017-04-27 DIAGNOSIS — J301 Allergic rhinitis due to pollen: Secondary | ICD-10-CM | POA: Diagnosis not present

## 2017-04-27 DIAGNOSIS — J3089 Other allergic rhinitis: Secondary | ICD-10-CM | POA: Diagnosis not present

## 2017-05-06 DIAGNOSIS — J301 Allergic rhinitis due to pollen: Secondary | ICD-10-CM | POA: Diagnosis not present

## 2017-05-06 DIAGNOSIS — J3089 Other allergic rhinitis: Secondary | ICD-10-CM | POA: Diagnosis not present

## 2017-05-12 DIAGNOSIS — E668 Other obesity: Secondary | ICD-10-CM | POA: Diagnosis not present

## 2017-05-12 DIAGNOSIS — R413 Other amnesia: Secondary | ICD-10-CM | POA: Diagnosis not present

## 2017-05-12 DIAGNOSIS — Z1389 Encounter for screening for other disorder: Secondary | ICD-10-CM | POA: Diagnosis not present

## 2017-05-12 DIAGNOSIS — J302 Other seasonal allergic rhinitis: Secondary | ICD-10-CM | POA: Diagnosis not present

## 2017-05-12 DIAGNOSIS — Z800 Family history of malignant neoplasm of digestive organs: Secondary | ICD-10-CM | POA: Diagnosis not present

## 2017-05-12 DIAGNOSIS — K219 Gastro-esophageal reflux disease without esophagitis: Secondary | ICD-10-CM | POA: Diagnosis not present

## 2017-05-12 DIAGNOSIS — J301 Allergic rhinitis due to pollen: Secondary | ICD-10-CM | POA: Diagnosis not present

## 2017-05-12 DIAGNOSIS — J3089 Other allergic rhinitis: Secondary | ICD-10-CM | POA: Diagnosis not present

## 2017-05-12 DIAGNOSIS — Z6837 Body mass index (BMI) 37.0-37.9, adult: Secondary | ICD-10-CM | POA: Diagnosis not present

## 2017-05-12 DIAGNOSIS — I10 Essential (primary) hypertension: Secondary | ICD-10-CM | POA: Diagnosis not present

## 2017-05-27 DIAGNOSIS — J3089 Other allergic rhinitis: Secondary | ICD-10-CM | POA: Diagnosis not present

## 2017-05-27 DIAGNOSIS — J301 Allergic rhinitis due to pollen: Secondary | ICD-10-CM | POA: Diagnosis not present

## 2017-06-03 DIAGNOSIS — J301 Allergic rhinitis due to pollen: Secondary | ICD-10-CM | POA: Diagnosis not present

## 2017-06-03 DIAGNOSIS — J3089 Other allergic rhinitis: Secondary | ICD-10-CM | POA: Diagnosis not present

## 2017-06-15 DIAGNOSIS — J301 Allergic rhinitis due to pollen: Secondary | ICD-10-CM | POA: Diagnosis not present

## 2017-06-15 DIAGNOSIS — J3089 Other allergic rhinitis: Secondary | ICD-10-CM | POA: Diagnosis not present

## 2017-06-17 DIAGNOSIS — J3089 Other allergic rhinitis: Secondary | ICD-10-CM | POA: Diagnosis not present

## 2017-06-17 DIAGNOSIS — J301 Allergic rhinitis due to pollen: Secondary | ICD-10-CM | POA: Diagnosis not present

## 2017-06-25 DIAGNOSIS — J301 Allergic rhinitis due to pollen: Secondary | ICD-10-CM | POA: Diagnosis not present

## 2017-06-25 DIAGNOSIS — J3089 Other allergic rhinitis: Secondary | ICD-10-CM | POA: Diagnosis not present

## 2017-07-07 DIAGNOSIS — J3089 Other allergic rhinitis: Secondary | ICD-10-CM | POA: Diagnosis not present

## 2017-07-07 DIAGNOSIS — J301 Allergic rhinitis due to pollen: Secondary | ICD-10-CM | POA: Diagnosis not present

## 2017-07-09 DIAGNOSIS — J301 Allergic rhinitis due to pollen: Secondary | ICD-10-CM | POA: Diagnosis not present

## 2017-07-09 DIAGNOSIS — J3089 Other allergic rhinitis: Secondary | ICD-10-CM | POA: Diagnosis not present

## 2017-07-12 DIAGNOSIS — J3089 Other allergic rhinitis: Secondary | ICD-10-CM | POA: Diagnosis not present

## 2017-07-12 DIAGNOSIS — J301 Allergic rhinitis due to pollen: Secondary | ICD-10-CM | POA: Diagnosis not present

## 2017-07-14 DIAGNOSIS — J3089 Other allergic rhinitis: Secondary | ICD-10-CM | POA: Diagnosis not present

## 2017-07-14 DIAGNOSIS — J301 Allergic rhinitis due to pollen: Secondary | ICD-10-CM | POA: Diagnosis not present

## 2017-07-20 DIAGNOSIS — J3089 Other allergic rhinitis: Secondary | ICD-10-CM | POA: Diagnosis not present

## 2017-07-20 DIAGNOSIS — J301 Allergic rhinitis due to pollen: Secondary | ICD-10-CM | POA: Diagnosis not present

## 2017-07-29 DIAGNOSIS — J3089 Other allergic rhinitis: Secondary | ICD-10-CM | POA: Diagnosis not present

## 2017-07-29 DIAGNOSIS — J301 Allergic rhinitis due to pollen: Secondary | ICD-10-CM | POA: Diagnosis not present

## 2017-08-03 DIAGNOSIS — J301 Allergic rhinitis due to pollen: Secondary | ICD-10-CM | POA: Diagnosis not present

## 2017-08-03 DIAGNOSIS — J3089 Other allergic rhinitis: Secondary | ICD-10-CM | POA: Diagnosis not present

## 2017-08-11 DIAGNOSIS — J3089 Other allergic rhinitis: Secondary | ICD-10-CM | POA: Diagnosis not present

## 2017-08-11 DIAGNOSIS — J301 Allergic rhinitis due to pollen: Secondary | ICD-10-CM | POA: Diagnosis not present

## 2017-08-26 DIAGNOSIS — J3089 Other allergic rhinitis: Secondary | ICD-10-CM | POA: Diagnosis not present

## 2017-08-26 DIAGNOSIS — J301 Allergic rhinitis due to pollen: Secondary | ICD-10-CM | POA: Diagnosis not present

## 2017-08-31 DIAGNOSIS — J301 Allergic rhinitis due to pollen: Secondary | ICD-10-CM | POA: Diagnosis not present

## 2017-08-31 DIAGNOSIS — J3089 Other allergic rhinitis: Secondary | ICD-10-CM | POA: Diagnosis not present

## 2017-09-14 DIAGNOSIS — J3089 Other allergic rhinitis: Secondary | ICD-10-CM | POA: Diagnosis not present

## 2017-09-14 DIAGNOSIS — J301 Allergic rhinitis due to pollen: Secondary | ICD-10-CM | POA: Diagnosis not present

## 2017-09-23 DIAGNOSIS — J301 Allergic rhinitis due to pollen: Secondary | ICD-10-CM | POA: Diagnosis not present

## 2017-09-23 DIAGNOSIS — J3089 Other allergic rhinitis: Secondary | ICD-10-CM | POA: Diagnosis not present

## 2017-09-28 DIAGNOSIS — J3089 Other allergic rhinitis: Secondary | ICD-10-CM | POA: Diagnosis not present

## 2017-09-28 DIAGNOSIS — J301 Allergic rhinitis due to pollen: Secondary | ICD-10-CM | POA: Diagnosis not present

## 2017-10-04 DIAGNOSIS — J3089 Other allergic rhinitis: Secondary | ICD-10-CM | POA: Diagnosis not present

## 2017-10-04 DIAGNOSIS — J301 Allergic rhinitis due to pollen: Secondary | ICD-10-CM | POA: Diagnosis not present

## 2017-10-15 DIAGNOSIS — J3089 Other allergic rhinitis: Secondary | ICD-10-CM | POA: Diagnosis not present

## 2017-10-15 DIAGNOSIS — J301 Allergic rhinitis due to pollen: Secondary | ICD-10-CM | POA: Diagnosis not present

## 2017-10-16 DIAGNOSIS — Z23 Encounter for immunization: Secondary | ICD-10-CM | POA: Diagnosis not present

## 2017-10-22 DIAGNOSIS — J3089 Other allergic rhinitis: Secondary | ICD-10-CM | POA: Diagnosis not present

## 2017-10-22 DIAGNOSIS — J301 Allergic rhinitis due to pollen: Secondary | ICD-10-CM | POA: Diagnosis not present

## 2017-11-05 DIAGNOSIS — J301 Allergic rhinitis due to pollen: Secondary | ICD-10-CM | POA: Diagnosis not present

## 2017-11-05 DIAGNOSIS — J3089 Other allergic rhinitis: Secondary | ICD-10-CM | POA: Diagnosis not present

## 2017-11-11 DIAGNOSIS — J301 Allergic rhinitis due to pollen: Secondary | ICD-10-CM | POA: Diagnosis not present

## 2017-11-11 DIAGNOSIS — J3089 Other allergic rhinitis: Secondary | ICD-10-CM | POA: Diagnosis not present

## 2017-11-19 DIAGNOSIS — J3089 Other allergic rhinitis: Secondary | ICD-10-CM | POA: Diagnosis not present

## 2017-11-19 DIAGNOSIS — J301 Allergic rhinitis due to pollen: Secondary | ICD-10-CM | POA: Diagnosis not present

## 2017-11-24 DIAGNOSIS — J301 Allergic rhinitis due to pollen: Secondary | ICD-10-CM | POA: Diagnosis not present

## 2017-11-24 DIAGNOSIS — J3089 Other allergic rhinitis: Secondary | ICD-10-CM | POA: Diagnosis not present

## 2017-12-02 DIAGNOSIS — J3089 Other allergic rhinitis: Secondary | ICD-10-CM | POA: Diagnosis not present

## 2017-12-02 DIAGNOSIS — J301 Allergic rhinitis due to pollen: Secondary | ICD-10-CM | POA: Diagnosis not present

## 2017-12-17 DIAGNOSIS — J3089 Other allergic rhinitis: Secondary | ICD-10-CM | POA: Diagnosis not present

## 2017-12-17 DIAGNOSIS — J301 Allergic rhinitis due to pollen: Secondary | ICD-10-CM | POA: Diagnosis not present

## 2017-12-17 DIAGNOSIS — E7849 Other hyperlipidemia: Secondary | ICD-10-CM | POA: Diagnosis not present

## 2017-12-17 DIAGNOSIS — R82998 Other abnormal findings in urine: Secondary | ICD-10-CM | POA: Diagnosis not present

## 2017-12-17 DIAGNOSIS — I10 Essential (primary) hypertension: Secondary | ICD-10-CM | POA: Diagnosis not present

## 2017-12-24 DIAGNOSIS — K219 Gastro-esophageal reflux disease without esophagitis: Secondary | ICD-10-CM | POA: Diagnosis not present

## 2017-12-24 DIAGNOSIS — R413 Other amnesia: Secondary | ICD-10-CM | POA: Diagnosis not present

## 2017-12-24 DIAGNOSIS — I10 Essential (primary) hypertension: Secondary | ICD-10-CM | POA: Diagnosis not present

## 2017-12-24 DIAGNOSIS — Z800 Family history of malignant neoplasm of digestive organs: Secondary | ICD-10-CM | POA: Diagnosis not present

## 2017-12-24 DIAGNOSIS — J302 Other seasonal allergic rhinitis: Secondary | ICD-10-CM | POA: Diagnosis not present

## 2017-12-24 DIAGNOSIS — Z0000 Encounter for general adult medical examination without abnormal findings: Secondary | ICD-10-CM | POA: Diagnosis not present

## 2017-12-24 DIAGNOSIS — M1990 Unspecified osteoarthritis, unspecified site: Secondary | ICD-10-CM | POA: Diagnosis not present

## 2017-12-24 DIAGNOSIS — E668 Other obesity: Secondary | ICD-10-CM | POA: Diagnosis not present

## 2017-12-24 DIAGNOSIS — J45998 Other asthma: Secondary | ICD-10-CM | POA: Diagnosis not present

## 2017-12-24 DIAGNOSIS — E7849 Other hyperlipidemia: Secondary | ICD-10-CM | POA: Diagnosis not present

## 2017-12-27 DIAGNOSIS — Z1212 Encounter for screening for malignant neoplasm of rectum: Secondary | ICD-10-CM | POA: Diagnosis not present

## 2017-12-28 DIAGNOSIS — H524 Presbyopia: Secondary | ICD-10-CM | POA: Diagnosis not present

## 2017-12-31 DIAGNOSIS — J301 Allergic rhinitis due to pollen: Secondary | ICD-10-CM | POA: Diagnosis not present

## 2017-12-31 DIAGNOSIS — J3089 Other allergic rhinitis: Secondary | ICD-10-CM | POA: Diagnosis not present

## 2018-01-11 DIAGNOSIS — J3089 Other allergic rhinitis: Secondary | ICD-10-CM | POA: Diagnosis not present

## 2018-01-11 DIAGNOSIS — J301 Allergic rhinitis due to pollen: Secondary | ICD-10-CM | POA: Diagnosis not present

## 2018-01-17 DIAGNOSIS — J3089 Other allergic rhinitis: Secondary | ICD-10-CM | POA: Diagnosis not present

## 2018-01-17 DIAGNOSIS — J301 Allergic rhinitis due to pollen: Secondary | ICD-10-CM | POA: Diagnosis not present

## 2018-01-20 DIAGNOSIS — J3089 Other allergic rhinitis: Secondary | ICD-10-CM | POA: Diagnosis not present

## 2018-01-20 DIAGNOSIS — J301 Allergic rhinitis due to pollen: Secondary | ICD-10-CM | POA: Diagnosis not present

## 2018-01-26 DIAGNOSIS — J301 Allergic rhinitis due to pollen: Secondary | ICD-10-CM | POA: Diagnosis not present

## 2018-01-26 DIAGNOSIS — J3089 Other allergic rhinitis: Secondary | ICD-10-CM | POA: Diagnosis not present

## 2018-01-28 DIAGNOSIS — J301 Allergic rhinitis due to pollen: Secondary | ICD-10-CM | POA: Diagnosis not present

## 2018-01-28 DIAGNOSIS — J3089 Other allergic rhinitis: Secondary | ICD-10-CM | POA: Diagnosis not present

## 2018-02-02 DIAGNOSIS — J301 Allergic rhinitis due to pollen: Secondary | ICD-10-CM | POA: Diagnosis not present

## 2018-02-02 DIAGNOSIS — J3089 Other allergic rhinitis: Secondary | ICD-10-CM | POA: Diagnosis not present

## 2018-02-10 DIAGNOSIS — J3089 Other allergic rhinitis: Secondary | ICD-10-CM | POA: Diagnosis not present

## 2018-02-10 DIAGNOSIS — J301 Allergic rhinitis due to pollen: Secondary | ICD-10-CM | POA: Diagnosis not present

## 2018-02-25 DIAGNOSIS — J301 Allergic rhinitis due to pollen: Secondary | ICD-10-CM | POA: Diagnosis not present

## 2018-02-25 DIAGNOSIS — J3089 Other allergic rhinitis: Secondary | ICD-10-CM | POA: Diagnosis not present

## 2018-03-08 DIAGNOSIS — J301 Allergic rhinitis due to pollen: Secondary | ICD-10-CM | POA: Diagnosis not present

## 2018-03-08 DIAGNOSIS — J3089 Other allergic rhinitis: Secondary | ICD-10-CM | POA: Diagnosis not present

## 2018-03-15 DIAGNOSIS — J3081 Allergic rhinitis due to animal (cat) (dog) hair and dander: Secondary | ICD-10-CM | POA: Diagnosis not present

## 2018-03-15 DIAGNOSIS — J301 Allergic rhinitis due to pollen: Secondary | ICD-10-CM | POA: Diagnosis not present

## 2018-03-25 DIAGNOSIS — Z0100 Encounter for examination of eyes and vision without abnormal findings: Secondary | ICD-10-CM | POA: Diagnosis not present

## 2018-03-31 DIAGNOSIS — J301 Allergic rhinitis due to pollen: Secondary | ICD-10-CM | POA: Diagnosis not present

## 2018-03-31 DIAGNOSIS — J3089 Other allergic rhinitis: Secondary | ICD-10-CM | POA: Diagnosis not present

## 2018-03-31 IMAGING — MG 2D DIGITAL SCREENING BILATERAL MAMMOGRAM WITH CAD AND ADJUNCT TO
9 of 14 series · 9 of 30 positions shown · non-contrast
Comparison: Previous exam(s).

CLINICAL DATA: Screening.

EXAM:
2D DIGITAL SCREENING BILATERAL MAMMOGRAM WITH CAD AND ADJUNCT TOMO

[R MLO (1 of 2)]
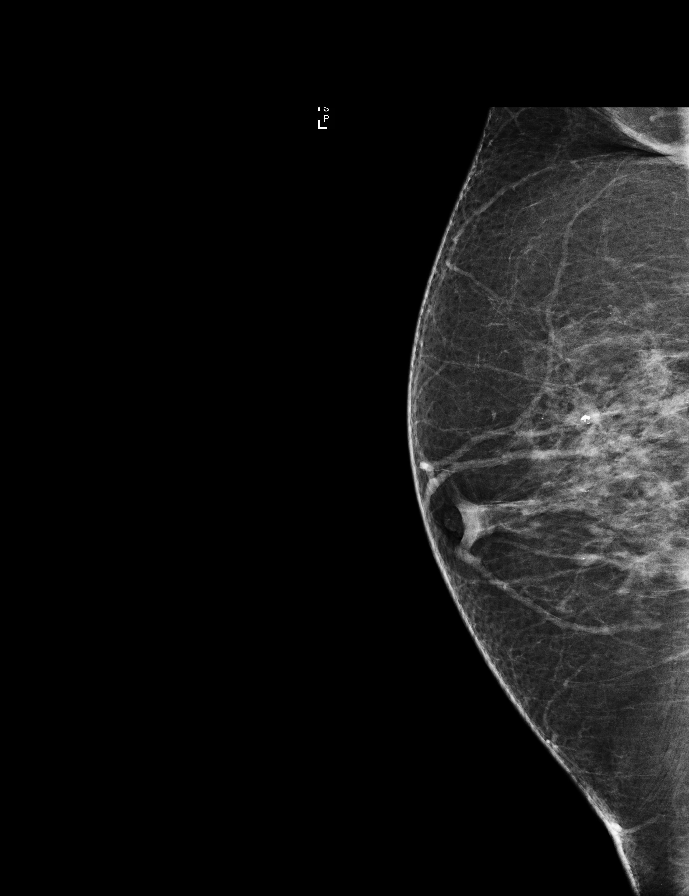

[L CC (1 of 2)]
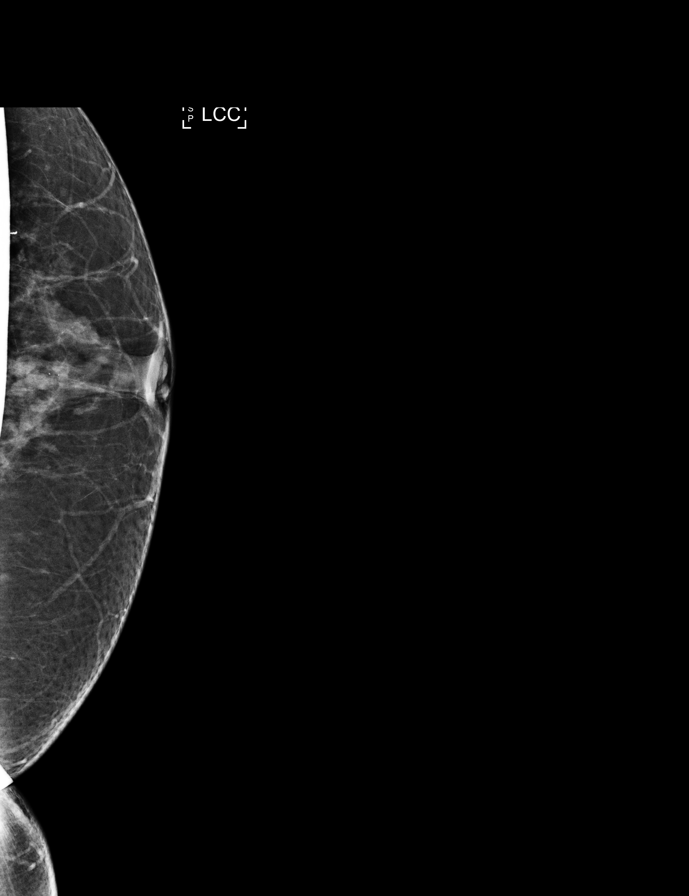

[R MLO synth-2D]
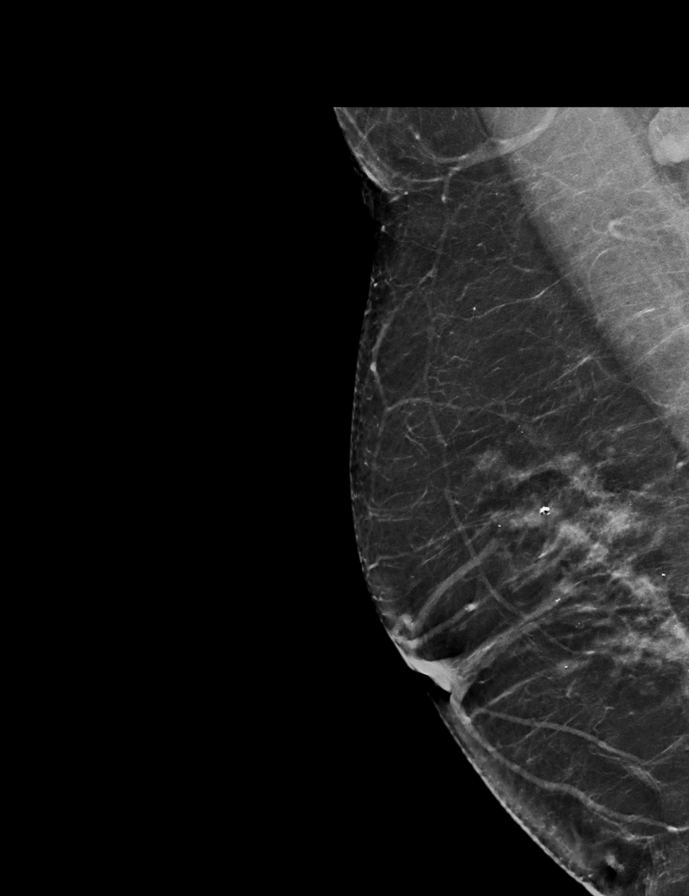

[L MLO]
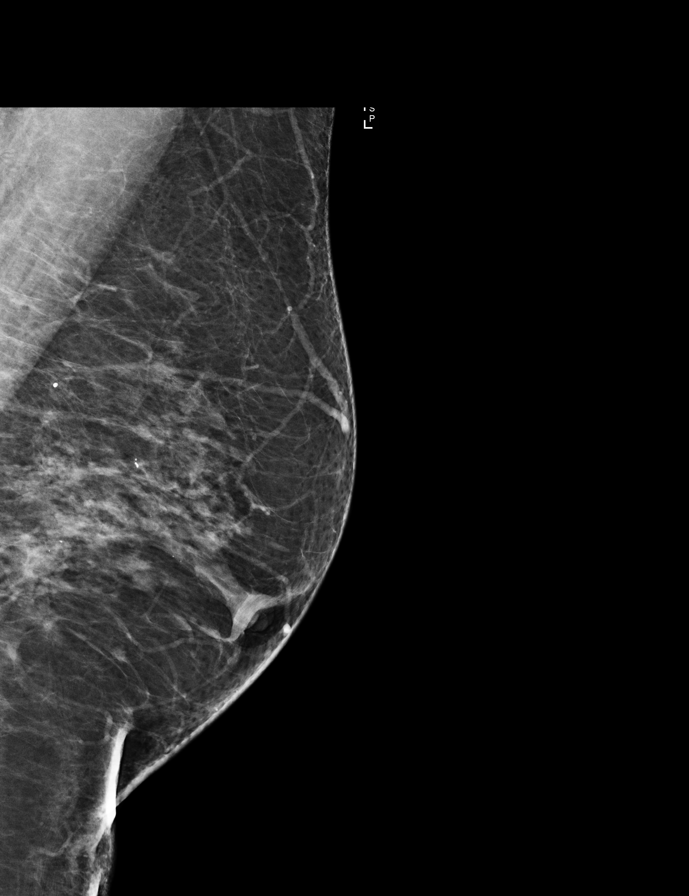

[R CC]
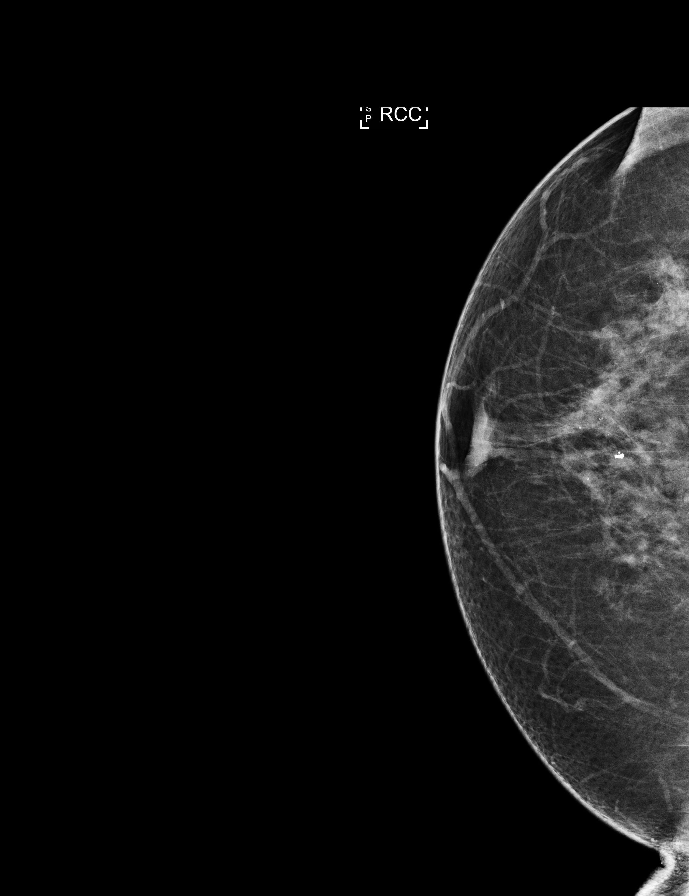

[R MLO (2 of 2)]
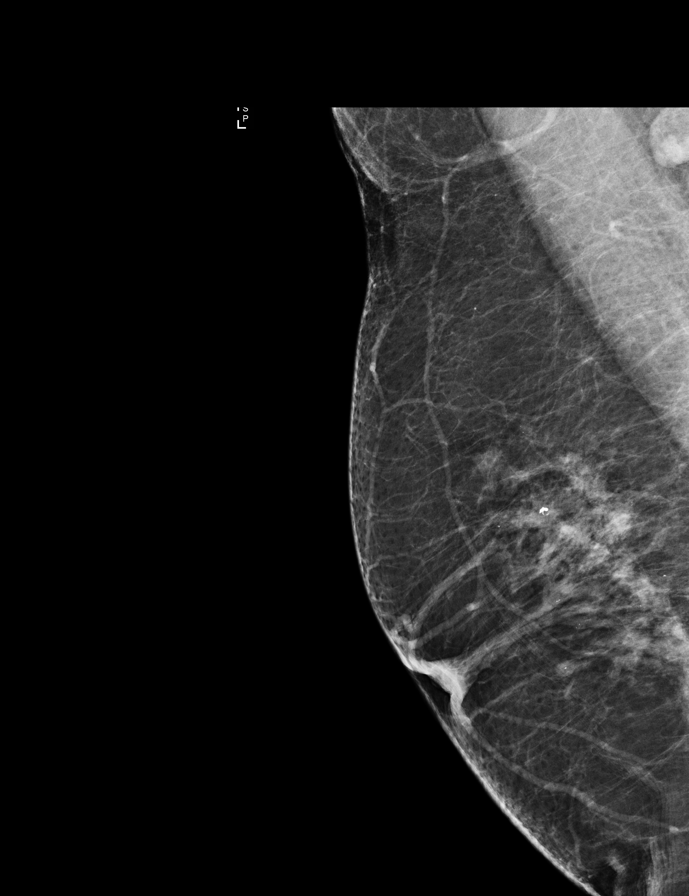

[L CC (2 of 2)]
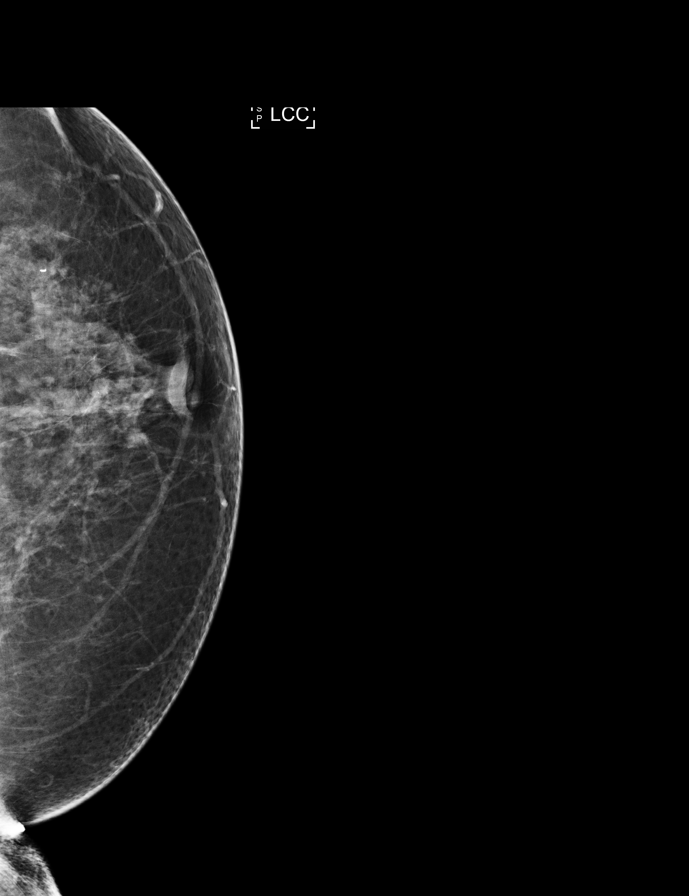

[L MLO synth-2D]
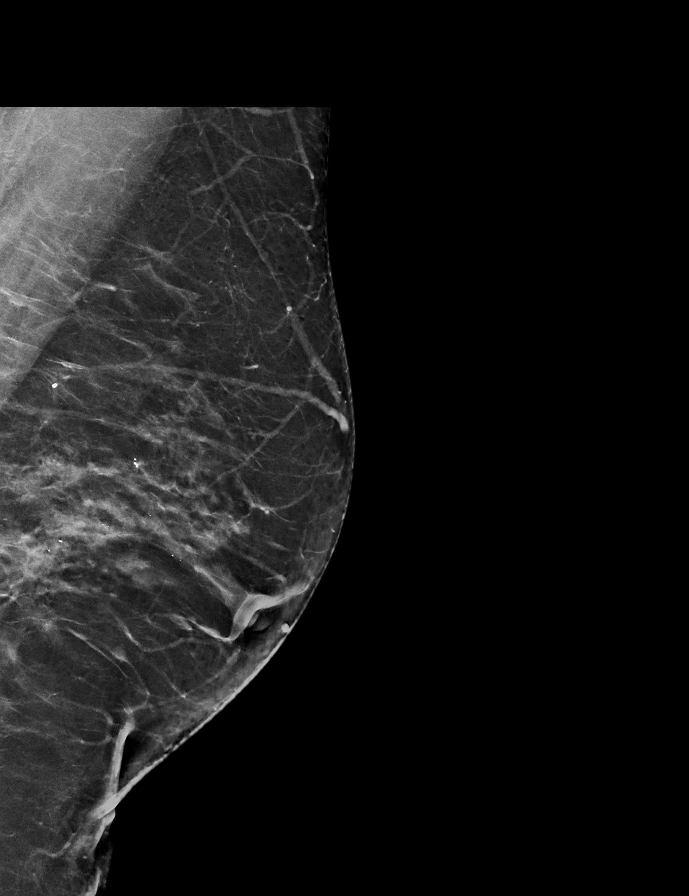

[R CC synth-2D]
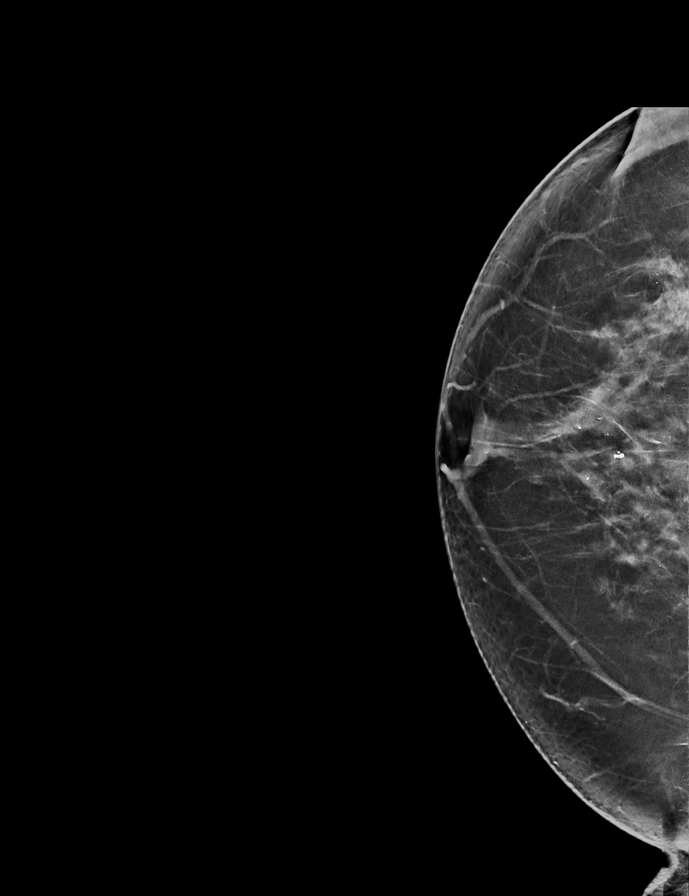

[9 of 30 positions shown; findings below may reference images not displayed]

ACR Breast Density Category c: The breast tissue is heterogeneously
dense, which may obscure small masses.
FINDINGS: There are no findings suspicious for malignancy. Images were
processed with CAD.
IMPRESSION: No mammographic evidence of malignancy. A result letter of this
screening mammogram will be mailed directly to the patient.

RECOMMENDATION:
Screening mammogram in one year. (Code:[id number])

BI-RADS CATEGORY  1: Negative.

## 2018-04-13 DIAGNOSIS — J301 Allergic rhinitis due to pollen: Secondary | ICD-10-CM | POA: Diagnosis not present

## 2018-04-13 DIAGNOSIS — J3089 Other allergic rhinitis: Secondary | ICD-10-CM | POA: Diagnosis not present

## 2018-04-29 DIAGNOSIS — J3089 Other allergic rhinitis: Secondary | ICD-10-CM | POA: Diagnosis not present

## 2018-04-29 DIAGNOSIS — J301 Allergic rhinitis due to pollen: Secondary | ICD-10-CM | POA: Diagnosis not present

## 2018-05-03 DIAGNOSIS — J3089 Other allergic rhinitis: Secondary | ICD-10-CM | POA: Diagnosis not present

## 2018-05-03 DIAGNOSIS — J4530 Mild persistent asthma, uncomplicated: Secondary | ICD-10-CM | POA: Diagnosis not present

## 2018-05-03 DIAGNOSIS — H1045 Other chronic allergic conjunctivitis: Secondary | ICD-10-CM | POA: Diagnosis not present

## 2018-05-12 DIAGNOSIS — J301 Allergic rhinitis due to pollen: Secondary | ICD-10-CM | POA: Diagnosis not present

## 2018-05-12 DIAGNOSIS — J3089 Other allergic rhinitis: Secondary | ICD-10-CM | POA: Diagnosis not present

## 2018-05-25 DIAGNOSIS — J3089 Other allergic rhinitis: Secondary | ICD-10-CM | POA: Diagnosis not present

## 2018-05-25 DIAGNOSIS — J301 Allergic rhinitis due to pollen: Secondary | ICD-10-CM | POA: Diagnosis not present

## 2018-06-01 DIAGNOSIS — J301 Allergic rhinitis due to pollen: Secondary | ICD-10-CM | POA: Diagnosis not present

## 2018-06-01 DIAGNOSIS — J3089 Other allergic rhinitis: Secondary | ICD-10-CM | POA: Diagnosis not present

## 2018-06-07 DIAGNOSIS — J301 Allergic rhinitis due to pollen: Secondary | ICD-10-CM | POA: Diagnosis not present

## 2018-06-07 DIAGNOSIS — J3089 Other allergic rhinitis: Secondary | ICD-10-CM | POA: Diagnosis not present

## 2018-06-15 DIAGNOSIS — J301 Allergic rhinitis due to pollen: Secondary | ICD-10-CM | POA: Diagnosis not present

## 2018-06-15 DIAGNOSIS — J3089 Other allergic rhinitis: Secondary | ICD-10-CM | POA: Diagnosis not present

## 2018-06-23 DIAGNOSIS — J301 Allergic rhinitis due to pollen: Secondary | ICD-10-CM | POA: Diagnosis not present

## 2018-06-23 DIAGNOSIS — J3089 Other allergic rhinitis: Secondary | ICD-10-CM | POA: Diagnosis not present

## 2018-07-04 DIAGNOSIS — J45909 Unspecified asthma, uncomplicated: Secondary | ICD-10-CM | POA: Diagnosis not present

## 2018-07-04 DIAGNOSIS — R413 Other amnesia: Secondary | ICD-10-CM | POA: Diagnosis not present

## 2018-07-04 DIAGNOSIS — K219 Gastro-esophageal reflux disease without esophagitis: Secondary | ICD-10-CM | POA: Diagnosis not present

## 2018-07-04 DIAGNOSIS — I10 Essential (primary) hypertension: Secondary | ICD-10-CM | POA: Diagnosis not present

## 2018-07-04 DIAGNOSIS — E669 Obesity, unspecified: Secondary | ICD-10-CM | POA: Diagnosis not present

## 2018-07-04 DIAGNOSIS — Z1331 Encounter for screening for depression: Secondary | ICD-10-CM | POA: Diagnosis not present

## 2018-07-04 DIAGNOSIS — J302 Other seasonal allergic rhinitis: Secondary | ICD-10-CM | POA: Diagnosis not present

## 2018-07-04 DIAGNOSIS — E785 Hyperlipidemia, unspecified: Secondary | ICD-10-CM | POA: Diagnosis not present

## 2018-07-07 DIAGNOSIS — J301 Allergic rhinitis due to pollen: Secondary | ICD-10-CM | POA: Diagnosis not present

## 2018-07-07 DIAGNOSIS — J3089 Other allergic rhinitis: Secondary | ICD-10-CM | POA: Diagnosis not present

## 2018-07-14 DIAGNOSIS — J3089 Other allergic rhinitis: Secondary | ICD-10-CM | POA: Diagnosis not present

## 2018-07-14 DIAGNOSIS — J301 Allergic rhinitis due to pollen: Secondary | ICD-10-CM | POA: Diagnosis not present

## 2018-07-28 DIAGNOSIS — J3089 Other allergic rhinitis: Secondary | ICD-10-CM | POA: Diagnosis not present

## 2018-07-28 DIAGNOSIS — J301 Allergic rhinitis due to pollen: Secondary | ICD-10-CM | POA: Diagnosis not present

## 2018-08-01 DIAGNOSIS — J3089 Other allergic rhinitis: Secondary | ICD-10-CM | POA: Diagnosis not present

## 2018-08-01 DIAGNOSIS — J301 Allergic rhinitis due to pollen: Secondary | ICD-10-CM | POA: Diagnosis not present

## 2018-08-04 DIAGNOSIS — J301 Allergic rhinitis due to pollen: Secondary | ICD-10-CM | POA: Diagnosis not present

## 2018-08-04 DIAGNOSIS — J3089 Other allergic rhinitis: Secondary | ICD-10-CM | POA: Diagnosis not present

## 2018-08-11 DIAGNOSIS — J301 Allergic rhinitis due to pollen: Secondary | ICD-10-CM | POA: Diagnosis not present

## 2018-08-11 DIAGNOSIS — J3089 Other allergic rhinitis: Secondary | ICD-10-CM | POA: Diagnosis not present

## 2018-08-17 DIAGNOSIS — J301 Allergic rhinitis due to pollen: Secondary | ICD-10-CM | POA: Diagnosis not present

## 2018-08-17 DIAGNOSIS — J3089 Other allergic rhinitis: Secondary | ICD-10-CM | POA: Diagnosis not present

## 2018-08-19 DIAGNOSIS — J3089 Other allergic rhinitis: Secondary | ICD-10-CM | POA: Diagnosis not present

## 2018-08-19 DIAGNOSIS — J301 Allergic rhinitis due to pollen: Secondary | ICD-10-CM | POA: Diagnosis not present

## 2018-08-23 DIAGNOSIS — J301 Allergic rhinitis due to pollen: Secondary | ICD-10-CM | POA: Diagnosis not present

## 2018-08-23 DIAGNOSIS — J3089 Other allergic rhinitis: Secondary | ICD-10-CM | POA: Diagnosis not present

## 2018-08-25 DIAGNOSIS — J301 Allergic rhinitis due to pollen: Secondary | ICD-10-CM | POA: Diagnosis not present

## 2018-08-25 DIAGNOSIS — J3089 Other allergic rhinitis: Secondary | ICD-10-CM | POA: Diagnosis not present

## 2018-08-31 DIAGNOSIS — J301 Allergic rhinitis due to pollen: Secondary | ICD-10-CM | POA: Diagnosis not present

## 2018-08-31 DIAGNOSIS — J3089 Other allergic rhinitis: Secondary | ICD-10-CM | POA: Diagnosis not present

## 2018-09-12 DIAGNOSIS — J301 Allergic rhinitis due to pollen: Secondary | ICD-10-CM | POA: Diagnosis not present

## 2018-09-12 DIAGNOSIS — J3089 Other allergic rhinitis: Secondary | ICD-10-CM | POA: Diagnosis not present

## 2018-09-23 DIAGNOSIS — Z23 Encounter for immunization: Secondary | ICD-10-CM | POA: Diagnosis not present

## 2018-09-23 DIAGNOSIS — J3089 Other allergic rhinitis: Secondary | ICD-10-CM | POA: Diagnosis not present

## 2018-09-23 DIAGNOSIS — J301 Allergic rhinitis due to pollen: Secondary | ICD-10-CM | POA: Diagnosis not present

## 2018-09-28 DIAGNOSIS — J301 Allergic rhinitis due to pollen: Secondary | ICD-10-CM | POA: Diagnosis not present

## 2018-09-28 DIAGNOSIS — J3089 Other allergic rhinitis: Secondary | ICD-10-CM | POA: Diagnosis not present

## 2018-10-13 DIAGNOSIS — J3089 Other allergic rhinitis: Secondary | ICD-10-CM | POA: Diagnosis not present

## 2018-10-13 DIAGNOSIS — J301 Allergic rhinitis due to pollen: Secondary | ICD-10-CM | POA: Diagnosis not present

## 2018-10-20 DIAGNOSIS — J301 Allergic rhinitis due to pollen: Secondary | ICD-10-CM | POA: Diagnosis not present

## 2018-10-20 DIAGNOSIS — J3089 Other allergic rhinitis: Secondary | ICD-10-CM | POA: Diagnosis not present

## 2018-11-04 DIAGNOSIS — J3089 Other allergic rhinitis: Secondary | ICD-10-CM | POA: Diagnosis not present

## 2018-11-04 DIAGNOSIS — J301 Allergic rhinitis due to pollen: Secondary | ICD-10-CM | POA: Diagnosis not present

## 2018-11-17 DIAGNOSIS — J301 Allergic rhinitis due to pollen: Secondary | ICD-10-CM | POA: Diagnosis not present

## 2018-11-17 DIAGNOSIS — J3089 Other allergic rhinitis: Secondary | ICD-10-CM | POA: Diagnosis not present

## 2018-12-02 DIAGNOSIS — J3089 Other allergic rhinitis: Secondary | ICD-10-CM | POA: Diagnosis not present

## 2018-12-02 DIAGNOSIS — J301 Allergic rhinitis due to pollen: Secondary | ICD-10-CM | POA: Diagnosis not present

## 2018-12-09 DIAGNOSIS — Z20828 Contact with and (suspected) exposure to other viral communicable diseases: Secondary | ICD-10-CM | POA: Diagnosis not present

## 2018-12-09 DIAGNOSIS — Z9189 Other specified personal risk factors, not elsewhere classified: Secondary | ICD-10-CM | POA: Diagnosis not present

## 2018-12-09 DIAGNOSIS — U071 COVID-19: Secondary | ICD-10-CM | POA: Diagnosis not present

## 2018-12-12 DIAGNOSIS — Z9189 Other specified personal risk factors, not elsewhere classified: Secondary | ICD-10-CM | POA: Diagnosis not present

## 2018-12-12 DIAGNOSIS — J069 Acute upper respiratory infection, unspecified: Secondary | ICD-10-CM | POA: Diagnosis not present

## 2018-12-12 DIAGNOSIS — U071 COVID-19: Secondary | ICD-10-CM | POA: Diagnosis not present

## 2018-12-12 DIAGNOSIS — R05 Cough: Secondary | ICD-10-CM | POA: Diagnosis not present

## 2018-12-22 DIAGNOSIS — B9789 Other viral agents as the cause of diseases classified elsewhere: Secondary | ICD-10-CM | POA: Diagnosis not present

## 2018-12-22 DIAGNOSIS — U071 COVID-19: Secondary | ICD-10-CM | POA: Diagnosis not present

## 2019-01-12 DIAGNOSIS — E7849 Other hyperlipidemia: Secondary | ICD-10-CM | POA: Diagnosis not present

## 2019-01-16 DIAGNOSIS — I10 Essential (primary) hypertension: Secondary | ICD-10-CM | POA: Diagnosis not present

## 2019-01-16 DIAGNOSIS — R82998 Other abnormal findings in urine: Secondary | ICD-10-CM | POA: Diagnosis not present

## 2019-01-17 DIAGNOSIS — Z1212 Encounter for screening for malignant neoplasm of rectum: Secondary | ICD-10-CM | POA: Diagnosis not present

## 2019-01-19 DIAGNOSIS — K219 Gastro-esophageal reflux disease without esophagitis: Secondary | ICD-10-CM | POA: Diagnosis not present

## 2019-01-19 DIAGNOSIS — Z800 Family history of malignant neoplasm of digestive organs: Secondary | ICD-10-CM | POA: Diagnosis not present

## 2019-01-19 DIAGNOSIS — E669 Obesity, unspecified: Secondary | ICD-10-CM | POA: Diagnosis not present

## 2019-01-19 DIAGNOSIS — Z1331 Encounter for screening for depression: Secondary | ICD-10-CM | POA: Diagnosis not present

## 2019-01-19 DIAGNOSIS — R413 Other amnesia: Secondary | ICD-10-CM | POA: Diagnosis not present

## 2019-01-19 DIAGNOSIS — E785 Hyperlipidemia, unspecified: Secondary | ICD-10-CM | POA: Diagnosis not present

## 2019-01-19 DIAGNOSIS — Z0000 Encounter for general adult medical examination without abnormal findings: Secondary | ICD-10-CM | POA: Diagnosis not present

## 2019-01-19 DIAGNOSIS — J302 Other seasonal allergic rhinitis: Secondary | ICD-10-CM | POA: Diagnosis not present

## 2019-01-19 DIAGNOSIS — H9120 Sudden idiopathic hearing loss, unspecified ear: Secondary | ICD-10-CM | POA: Diagnosis not present

## 2019-01-19 DIAGNOSIS — I10 Essential (primary) hypertension: Secondary | ICD-10-CM | POA: Diagnosis not present

## 2019-01-19 DIAGNOSIS — J45909 Unspecified asthma, uncomplicated: Secondary | ICD-10-CM | POA: Diagnosis not present

## 2019-02-21 ENCOUNTER — Ambulatory Visit: Payer: Medicare HMO | Attending: Internal Medicine

## 2019-02-21 DIAGNOSIS — Z23 Encounter for immunization: Secondary | ICD-10-CM | POA: Insufficient documentation

## 2019-02-21 NOTE — Progress Notes (Signed)
   Covid-19 Vaccination Clinic  Name:  [name]    MRN: [id number] DOB: February 01, 1945  02/21/2019  [name] was observed post Covid-19 immunization for 15 minutes without incidence. She was provided with Vaccine Information Sheet and instruction to access the V-Safe system.   [name] was instructed to call 911 with any severe reactions post vaccine: Marland Kitchen Difficulty breathing  . Swelling of your face and throat  . A fast heartbeat  . A bad rash all over your body  . Dizziness and weakness    Immunizations Administered    Name Date Dose VIS Date Route   Pfizer COVID-19 Vaccine 02/21/2019  2:32 PM 0.3 mL 12/23/2018 Intramuscular   Manufacturer: [place]   Lot: [id number]   [place]: [id number]

## 2019-02-23 ENCOUNTER — Ambulatory Visit: Payer: Medicare HMO

## 2019-03-18 ENCOUNTER — Ambulatory Visit: Payer: Medicare HMO | Attending: Internal Medicine

## 2019-03-18 DIAGNOSIS — Z23 Encounter for immunization: Secondary | ICD-10-CM | POA: Insufficient documentation

## 2019-03-18 NOTE — Progress Notes (Signed)
   Covid-19 Vaccination Clinic  Name:  [name]    MRN: [id number] DOB: 11/09/1945  03/18/2019  [name] was observed post Covid-19 immunization for 15 minutes without incident. She was provided with Vaccine Information Sheet and instruction to access the V-Safe system.   [name] was instructed to call 911 with any severe reactions post vaccine: Marland Kitchen Difficulty breathing  . Swelling of face and throat  . A fast heartbeat  . A bad rash all over body  . Dizziness and weakness   Immunizations Administered    Name Date Dose VIS Date Route   Pfizer COVID-19 Vaccine 03/18/2019  8:33 AM 0.3 mL 12/23/2018 Intramuscular   Manufacturer: [place]   Lot: [id number]   [place]: [id number]

## 2019-04-02 IMAGING — MG 2D DIGITAL SCREENING BILATERAL MAMMOGRAM WITH CAD AND ADJUNCT TO
9 of 13 series · 9 of 29 positions shown · non-contrast
Comparison: Previous exam(s).

CLINICAL DATA: Screening.

EXAM:
2D DIGITAL SCREENING BILATERAL MAMMOGRAM WITH CAD AND ADJUNCT TOMO

[L MLO (1 of 2)]
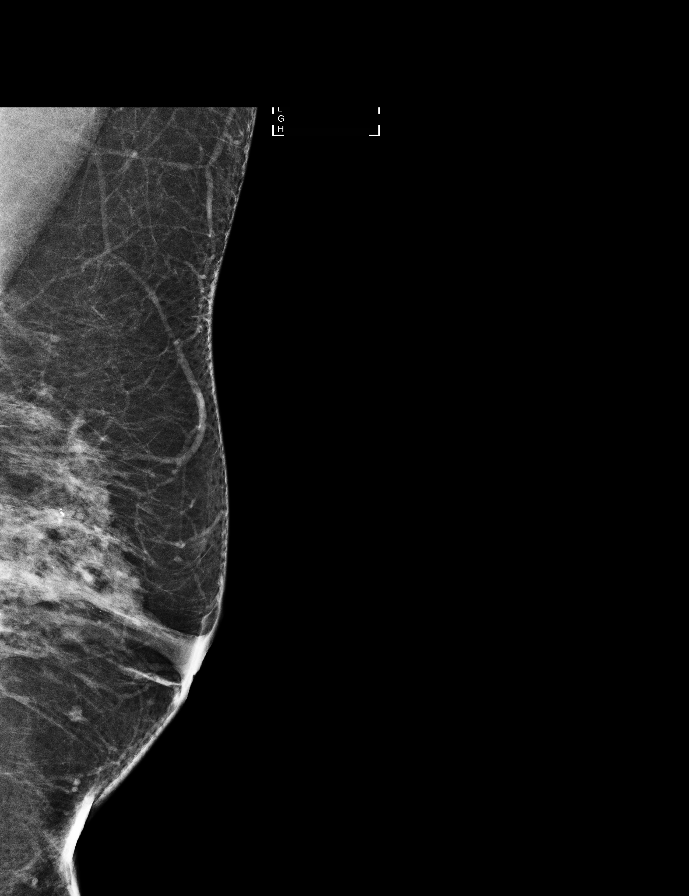

[L MLO synth-2D]
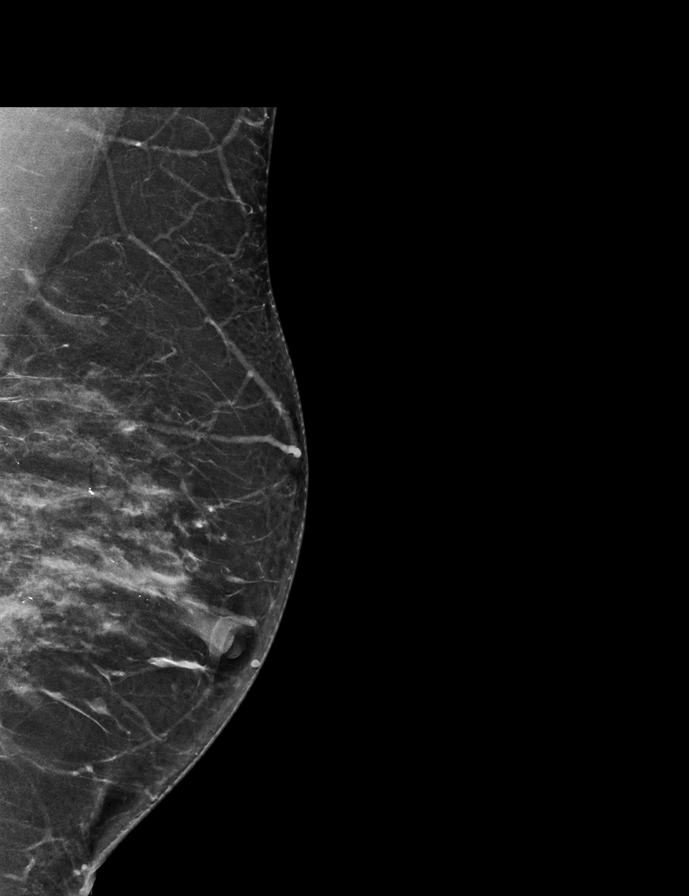

[L CC]
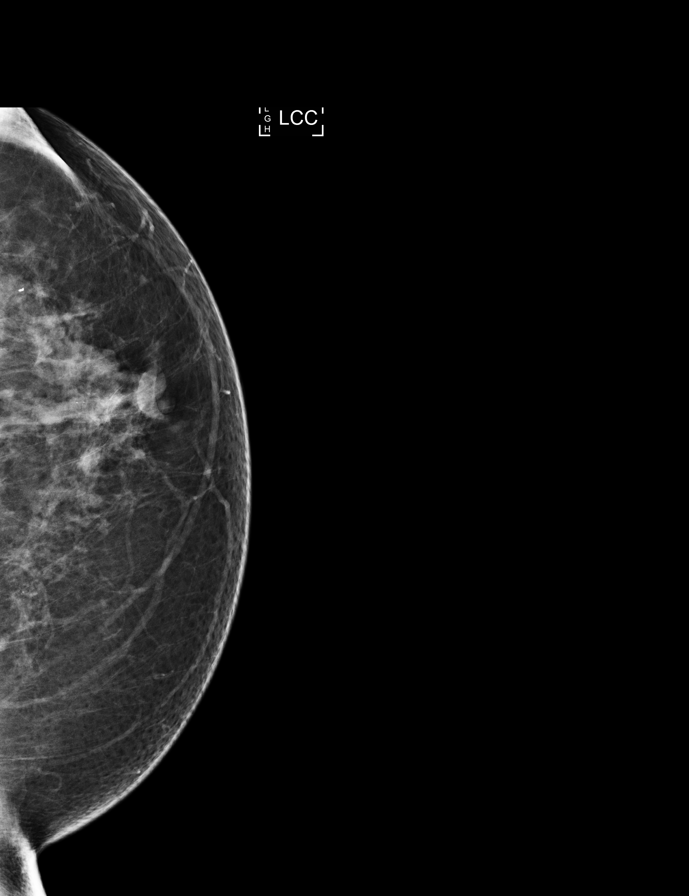

[L CC synth-2D]
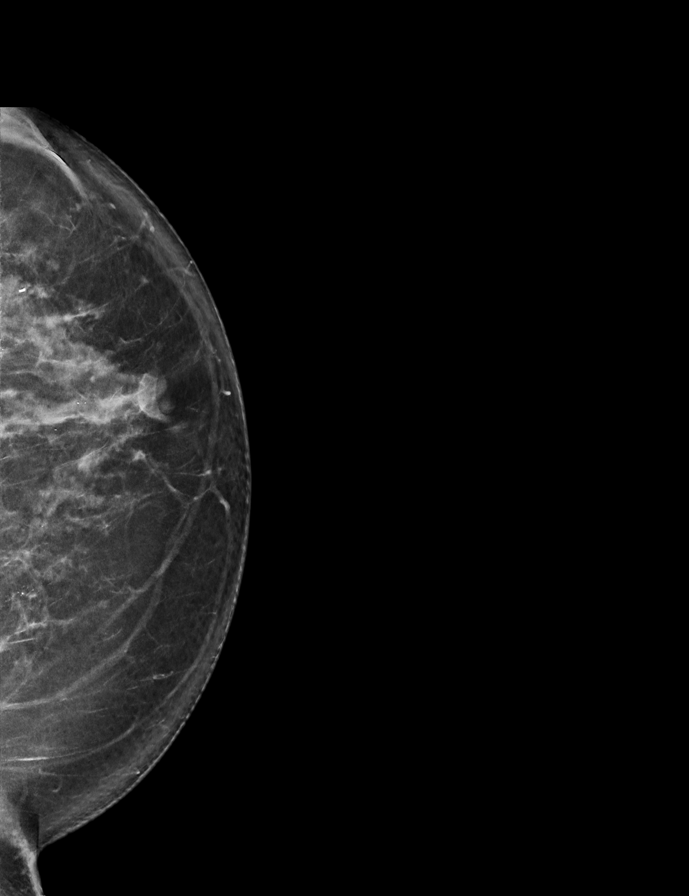

[R CC]
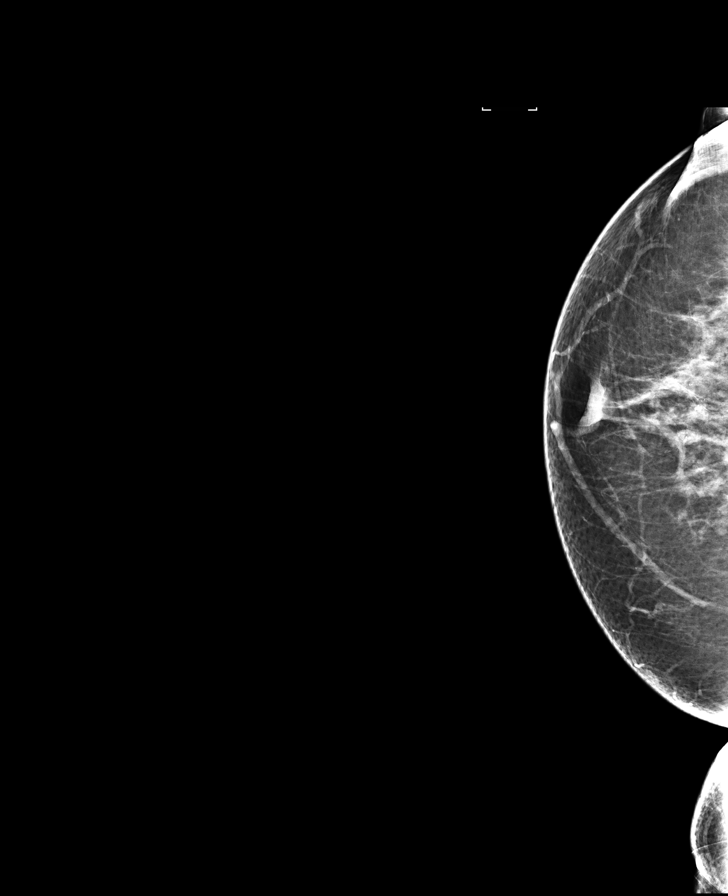

[L MLO (2 of 2)]
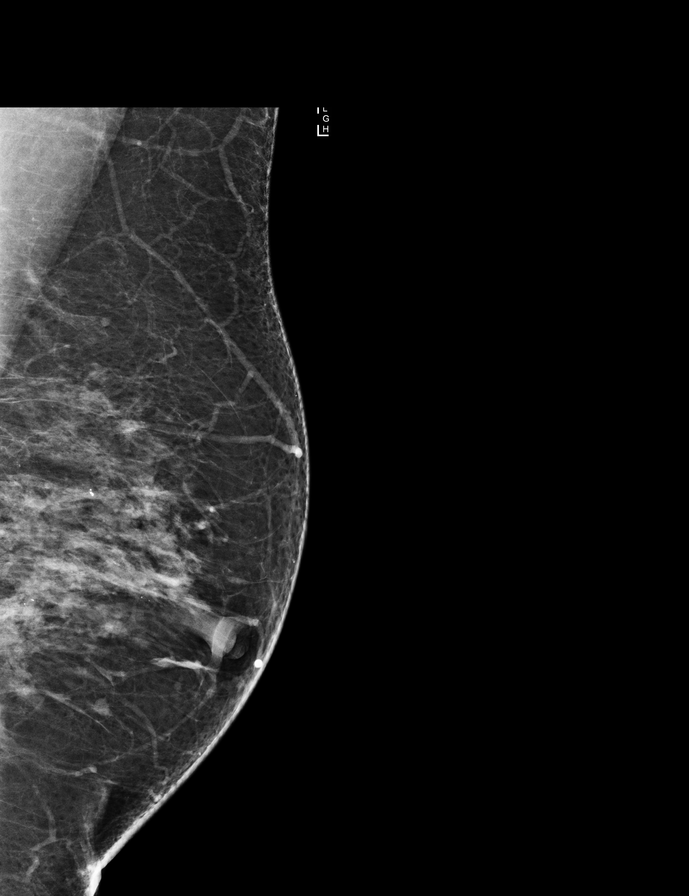

[R CC synth-2D]
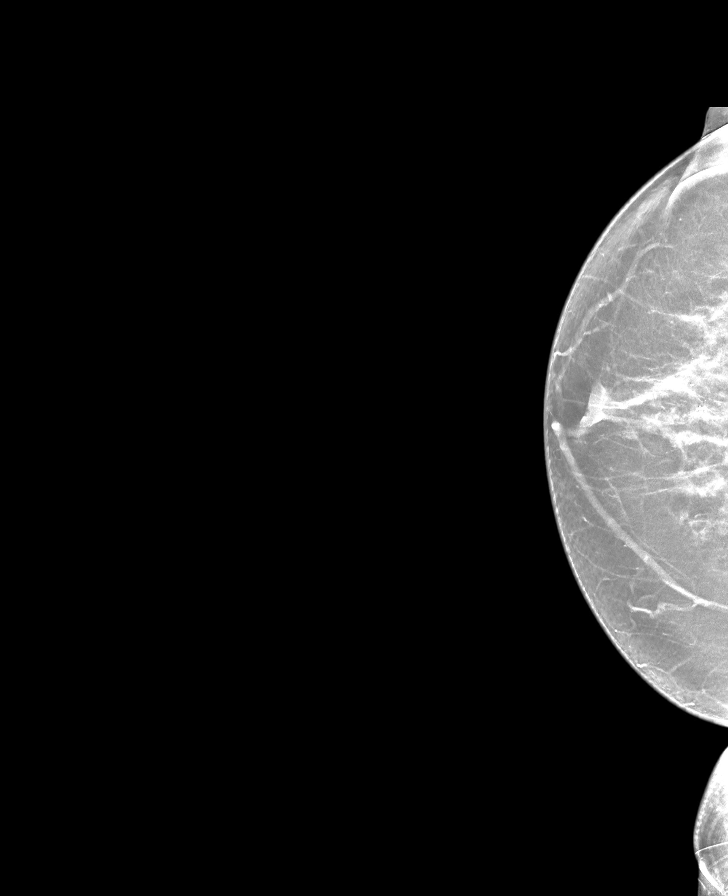

[R MLO synth-2D]
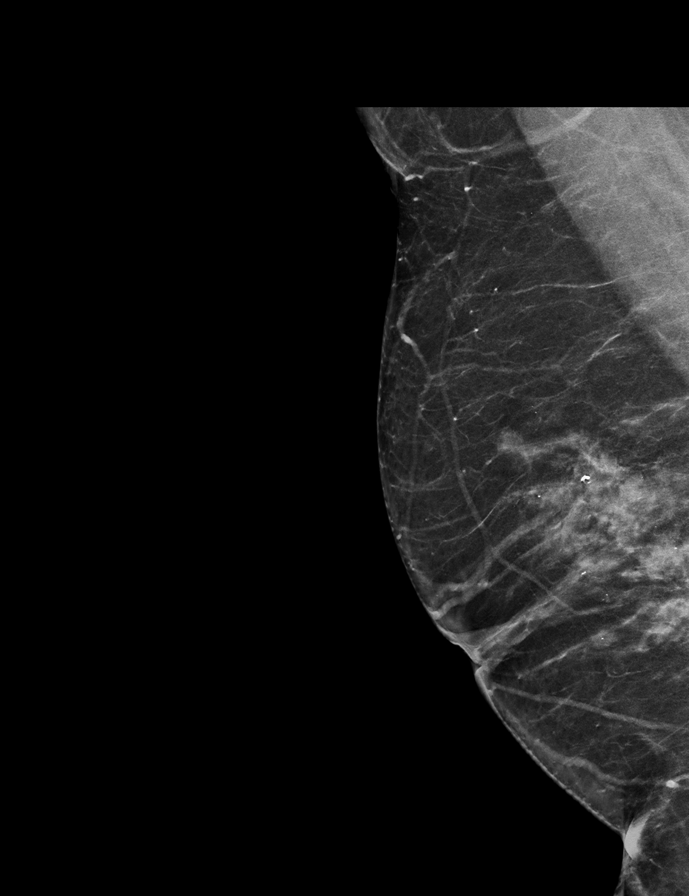

[R MLO]
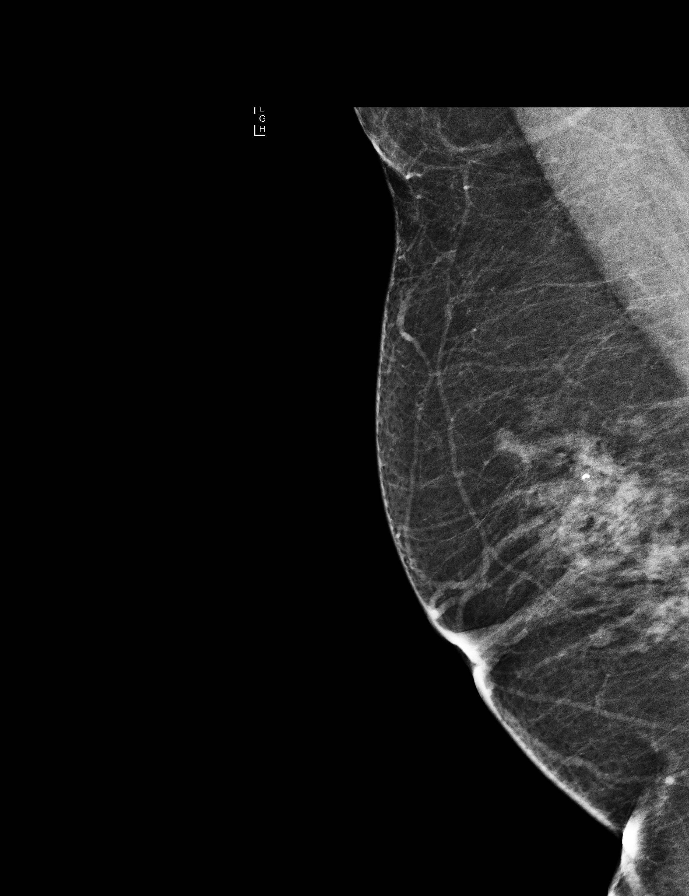

[9 of 29 positions shown; findings below may reference images not displayed]

ACR Breast Density Category b: There are scattered areas of
fibroglandular density.
FINDINGS: There are no findings suspicious for malignancy. Images were
processed with CAD.
IMPRESSION: No mammographic evidence of malignancy. A result letter of this
screening mammogram will be mailed directly to the patient.

RECOMMENDATION:
Screening mammogram in one year. (Code:97-6-RS4)

BI-RADS CATEGORY  1: Negative.

## 2019-04-13 DIAGNOSIS — J4530 Mild persistent asthma, uncomplicated: Secondary | ICD-10-CM | POA: Diagnosis not present

## 2019-04-13 DIAGNOSIS — H1045 Other chronic allergic conjunctivitis: Secondary | ICD-10-CM | POA: Diagnosis not present

## 2019-04-13 DIAGNOSIS — J3089 Other allergic rhinitis: Secondary | ICD-10-CM | POA: Diagnosis not present

## 2019-04-18 ENCOUNTER — Other Ambulatory Visit: Payer: Self-pay | Admitting: Internal Medicine

## 2019-04-18 DIAGNOSIS — Z1231 Encounter for screening mammogram for malignant neoplasm of breast: Secondary | ICD-10-CM

## 2019-04-25 DIAGNOSIS — J3089 Other allergic rhinitis: Secondary | ICD-10-CM | POA: Diagnosis not present

## 2019-04-25 DIAGNOSIS — J301 Allergic rhinitis due to pollen: Secondary | ICD-10-CM | POA: Diagnosis not present

## 2019-04-27 DIAGNOSIS — J301 Allergic rhinitis due to pollen: Secondary | ICD-10-CM | POA: Diagnosis not present

## 2019-04-27 DIAGNOSIS — J3089 Other allergic rhinitis: Secondary | ICD-10-CM | POA: Diagnosis not present

## 2019-05-02 DIAGNOSIS — J3089 Other allergic rhinitis: Secondary | ICD-10-CM | POA: Diagnosis not present

## 2019-05-02 DIAGNOSIS — J301 Allergic rhinitis due to pollen: Secondary | ICD-10-CM | POA: Diagnosis not present

## 2019-05-04 DIAGNOSIS — E669 Obesity, unspecified: Secondary | ICD-10-CM | POA: Diagnosis not present

## 2019-05-04 DIAGNOSIS — M1990 Unspecified osteoarthritis, unspecified site: Secondary | ICD-10-CM | POA: Diagnosis not present

## 2019-05-04 DIAGNOSIS — J45909 Unspecified asthma, uncomplicated: Secondary | ICD-10-CM | POA: Diagnosis not present

## 2019-05-04 DIAGNOSIS — I10 Essential (primary) hypertension: Secondary | ICD-10-CM | POA: Diagnosis not present

## 2019-05-04 DIAGNOSIS — K219 Gastro-esophageal reflux disease without esophagitis: Secondary | ICD-10-CM | POA: Diagnosis not present

## 2019-05-04 DIAGNOSIS — J302 Other seasonal allergic rhinitis: Secondary | ICD-10-CM | POA: Diagnosis not present

## 2019-05-04 DIAGNOSIS — E785 Hyperlipidemia, unspecified: Secondary | ICD-10-CM | POA: Diagnosis not present

## 2019-05-05 DIAGNOSIS — J3089 Other allergic rhinitis: Secondary | ICD-10-CM | POA: Diagnosis not present

## 2019-05-05 DIAGNOSIS — J301 Allergic rhinitis due to pollen: Secondary | ICD-10-CM | POA: Diagnosis not present

## 2019-05-09 DIAGNOSIS — J3089 Other allergic rhinitis: Secondary | ICD-10-CM | POA: Diagnosis not present

## 2019-05-09 DIAGNOSIS — J301 Allergic rhinitis due to pollen: Secondary | ICD-10-CM | POA: Diagnosis not present

## 2019-05-12 DIAGNOSIS — J301 Allergic rhinitis due to pollen: Secondary | ICD-10-CM | POA: Diagnosis not present

## 2019-05-12 DIAGNOSIS — J3089 Other allergic rhinitis: Secondary | ICD-10-CM | POA: Diagnosis not present

## 2019-05-16 DIAGNOSIS — J301 Allergic rhinitis due to pollen: Secondary | ICD-10-CM | POA: Diagnosis not present

## 2019-05-16 DIAGNOSIS — J3089 Other allergic rhinitis: Secondary | ICD-10-CM | POA: Diagnosis not present

## 2019-05-23 DIAGNOSIS — J301 Allergic rhinitis due to pollen: Secondary | ICD-10-CM | POA: Diagnosis not present

## 2019-05-23 DIAGNOSIS — J3089 Other allergic rhinitis: Secondary | ICD-10-CM | POA: Diagnosis not present

## 2019-05-30 DIAGNOSIS — J301 Allergic rhinitis due to pollen: Secondary | ICD-10-CM | POA: Diagnosis not present

## 2019-05-30 DIAGNOSIS — J3089 Other allergic rhinitis: Secondary | ICD-10-CM | POA: Diagnosis not present

## 2019-06-06 DIAGNOSIS — J3089 Other allergic rhinitis: Secondary | ICD-10-CM | POA: Diagnosis not present

## 2019-06-06 DIAGNOSIS — J301 Allergic rhinitis due to pollen: Secondary | ICD-10-CM | POA: Diagnosis not present

## 2019-06-16 DIAGNOSIS — J3089 Other allergic rhinitis: Secondary | ICD-10-CM | POA: Diagnosis not present

## 2019-06-16 DIAGNOSIS — J301 Allergic rhinitis due to pollen: Secondary | ICD-10-CM | POA: Diagnosis not present

## 2019-06-22 DIAGNOSIS — J3089 Other allergic rhinitis: Secondary | ICD-10-CM | POA: Diagnosis not present

## 2019-06-22 DIAGNOSIS — J301 Allergic rhinitis due to pollen: Secondary | ICD-10-CM | POA: Diagnosis not present

## 2019-06-27 DIAGNOSIS — J301 Allergic rhinitis due to pollen: Secondary | ICD-10-CM | POA: Diagnosis not present

## 2019-06-27 DIAGNOSIS — J3089 Other allergic rhinitis: Secondary | ICD-10-CM | POA: Diagnosis not present

## 2019-07-07 DIAGNOSIS — J301 Allergic rhinitis due to pollen: Secondary | ICD-10-CM | POA: Diagnosis not present

## 2019-07-07 DIAGNOSIS — J3089 Other allergic rhinitis: Secondary | ICD-10-CM | POA: Diagnosis not present

## 2019-07-13 DIAGNOSIS — J301 Allergic rhinitis due to pollen: Secondary | ICD-10-CM | POA: Diagnosis not present

## 2019-07-13 DIAGNOSIS — J3089 Other allergic rhinitis: Secondary | ICD-10-CM | POA: Diagnosis not present

## 2019-07-18 DIAGNOSIS — J301 Allergic rhinitis due to pollen: Secondary | ICD-10-CM | POA: Diagnosis not present

## 2019-07-18 DIAGNOSIS — J3089 Other allergic rhinitis: Secondary | ICD-10-CM | POA: Diagnosis not present

## 2019-07-28 DIAGNOSIS — J301 Allergic rhinitis due to pollen: Secondary | ICD-10-CM | POA: Diagnosis not present

## 2019-07-28 DIAGNOSIS — J3089 Other allergic rhinitis: Secondary | ICD-10-CM | POA: Diagnosis not present

## 2019-08-03 DIAGNOSIS — J3089 Other allergic rhinitis: Secondary | ICD-10-CM | POA: Diagnosis not present

## 2019-08-03 DIAGNOSIS — J301 Allergic rhinitis due to pollen: Secondary | ICD-10-CM | POA: Diagnosis not present

## 2019-08-08 DIAGNOSIS — J3089 Other allergic rhinitis: Secondary | ICD-10-CM | POA: Diagnosis not present

## 2019-08-08 DIAGNOSIS — J301 Allergic rhinitis due to pollen: Secondary | ICD-10-CM | POA: Diagnosis not present

## 2019-08-10 DIAGNOSIS — J3089 Other allergic rhinitis: Secondary | ICD-10-CM | POA: Diagnosis not present

## 2019-08-10 DIAGNOSIS — J301 Allergic rhinitis due to pollen: Secondary | ICD-10-CM | POA: Diagnosis not present

## 2019-08-17 DIAGNOSIS — J301 Allergic rhinitis due to pollen: Secondary | ICD-10-CM | POA: Diagnosis not present

## 2019-08-17 DIAGNOSIS — J3089 Other allergic rhinitis: Secondary | ICD-10-CM | POA: Diagnosis not present

## 2019-08-24 DIAGNOSIS — J3089 Other allergic rhinitis: Secondary | ICD-10-CM | POA: Diagnosis not present

## 2019-08-24 DIAGNOSIS — J301 Allergic rhinitis due to pollen: Secondary | ICD-10-CM | POA: Diagnosis not present

## 2019-08-31 DIAGNOSIS — J301 Allergic rhinitis due to pollen: Secondary | ICD-10-CM | POA: Diagnosis not present

## 2019-08-31 DIAGNOSIS — J3089 Other allergic rhinitis: Secondary | ICD-10-CM | POA: Diagnosis not present

## 2019-09-05 DIAGNOSIS — J301 Allergic rhinitis due to pollen: Secondary | ICD-10-CM | POA: Diagnosis not present

## 2019-09-05 DIAGNOSIS — J3089 Other allergic rhinitis: Secondary | ICD-10-CM | POA: Diagnosis not present

## 2019-09-08 DIAGNOSIS — J3089 Other allergic rhinitis: Secondary | ICD-10-CM | POA: Diagnosis not present

## 2019-09-08 DIAGNOSIS — J301 Allergic rhinitis due to pollen: Secondary | ICD-10-CM | POA: Diagnosis not present

## 2019-09-12 DIAGNOSIS — J3089 Other allergic rhinitis: Secondary | ICD-10-CM | POA: Diagnosis not present

## 2019-09-12 DIAGNOSIS — J301 Allergic rhinitis due to pollen: Secondary | ICD-10-CM | POA: Diagnosis not present

## 2019-09-14 DIAGNOSIS — J3089 Other allergic rhinitis: Secondary | ICD-10-CM | POA: Diagnosis not present

## 2019-09-14 DIAGNOSIS — J301 Allergic rhinitis due to pollen: Secondary | ICD-10-CM | POA: Diagnosis not present

## 2019-09-19 DIAGNOSIS — J301 Allergic rhinitis due to pollen: Secondary | ICD-10-CM | POA: Diagnosis not present

## 2019-09-19 DIAGNOSIS — J3089 Other allergic rhinitis: Secondary | ICD-10-CM | POA: Diagnosis not present

## 2019-09-21 DIAGNOSIS — J3089 Other allergic rhinitis: Secondary | ICD-10-CM | POA: Diagnosis not present

## 2019-09-21 DIAGNOSIS — J301 Allergic rhinitis due to pollen: Secondary | ICD-10-CM | POA: Diagnosis not present

## 2019-09-26 DIAGNOSIS — J3089 Other allergic rhinitis: Secondary | ICD-10-CM | POA: Diagnosis not present

## 2019-09-26 DIAGNOSIS — J301 Allergic rhinitis due to pollen: Secondary | ICD-10-CM | POA: Diagnosis not present

## 2019-10-02 DIAGNOSIS — I10 Essential (primary) hypertension: Secondary | ICD-10-CM | POA: Diagnosis not present

## 2019-10-02 DIAGNOSIS — J302 Other seasonal allergic rhinitis: Secondary | ICD-10-CM | POA: Diagnosis not present

## 2019-10-02 DIAGNOSIS — E785 Hyperlipidemia, unspecified: Secondary | ICD-10-CM | POA: Diagnosis not present

## 2019-10-02 DIAGNOSIS — J45909 Unspecified asthma, uncomplicated: Secondary | ICD-10-CM | POA: Diagnosis not present

## 2019-10-02 DIAGNOSIS — R413 Other amnesia: Secondary | ICD-10-CM | POA: Diagnosis not present

## 2019-10-02 DIAGNOSIS — M1990 Unspecified osteoarthritis, unspecified site: Secondary | ICD-10-CM | POA: Diagnosis not present

## 2019-10-02 DIAGNOSIS — K219 Gastro-esophageal reflux disease without esophagitis: Secondary | ICD-10-CM | POA: Diagnosis not present

## 2019-10-02 DIAGNOSIS — E669 Obesity, unspecified: Secondary | ICD-10-CM | POA: Diagnosis not present

## 2019-10-02 DIAGNOSIS — Z800 Family history of malignant neoplasm of digestive organs: Secondary | ICD-10-CM | POA: Diagnosis not present

## 2019-10-02 DIAGNOSIS — H9120 Sudden idiopathic hearing loss, unspecified ear: Secondary | ICD-10-CM | POA: Diagnosis not present

## 2019-10-03 DIAGNOSIS — J301 Allergic rhinitis due to pollen: Secondary | ICD-10-CM | POA: Diagnosis not present

## 2019-10-03 DIAGNOSIS — J3089 Other allergic rhinitis: Secondary | ICD-10-CM | POA: Diagnosis not present

## 2019-10-12 DIAGNOSIS — J3089 Other allergic rhinitis: Secondary | ICD-10-CM | POA: Diagnosis not present

## 2019-10-12 DIAGNOSIS — J301 Allergic rhinitis due to pollen: Secondary | ICD-10-CM | POA: Diagnosis not present

## 2019-10-19 DIAGNOSIS — J3089 Other allergic rhinitis: Secondary | ICD-10-CM | POA: Diagnosis not present

## 2019-10-19 DIAGNOSIS — J301 Allergic rhinitis due to pollen: Secondary | ICD-10-CM | POA: Diagnosis not present

## 2019-10-27 DIAGNOSIS — J301 Allergic rhinitis due to pollen: Secondary | ICD-10-CM | POA: Diagnosis not present

## 2019-10-27 DIAGNOSIS — J3089 Other allergic rhinitis: Secondary | ICD-10-CM | POA: Diagnosis not present

## 2019-11-02 DIAGNOSIS — J3089 Other allergic rhinitis: Secondary | ICD-10-CM | POA: Diagnosis not present

## 2019-11-02 DIAGNOSIS — J301 Allergic rhinitis due to pollen: Secondary | ICD-10-CM | POA: Diagnosis not present

## 2019-11-09 DIAGNOSIS — J3089 Other allergic rhinitis: Secondary | ICD-10-CM | POA: Diagnosis not present

## 2019-11-09 DIAGNOSIS — J301 Allergic rhinitis due to pollen: Secondary | ICD-10-CM | POA: Diagnosis not present

## 2019-11-16 DIAGNOSIS — J301 Allergic rhinitis due to pollen: Secondary | ICD-10-CM | POA: Diagnosis not present

## 2019-11-16 DIAGNOSIS — J3089 Other allergic rhinitis: Secondary | ICD-10-CM | POA: Diagnosis not present

## 2019-11-23 DIAGNOSIS — J3089 Other allergic rhinitis: Secondary | ICD-10-CM | POA: Diagnosis not present

## 2019-11-23 DIAGNOSIS — J301 Allergic rhinitis due to pollen: Secondary | ICD-10-CM | POA: Diagnosis not present

## 2019-11-30 DIAGNOSIS — J3089 Other allergic rhinitis: Secondary | ICD-10-CM | POA: Diagnosis not present

## 2019-11-30 DIAGNOSIS — J301 Allergic rhinitis due to pollen: Secondary | ICD-10-CM | POA: Diagnosis not present

## 2019-12-05 DIAGNOSIS — J301 Allergic rhinitis due to pollen: Secondary | ICD-10-CM | POA: Diagnosis not present

## 2019-12-05 DIAGNOSIS — J3089 Other allergic rhinitis: Secondary | ICD-10-CM | POA: Diagnosis not present

## 2019-12-12 DIAGNOSIS — J3081 Allergic rhinitis due to animal (cat) (dog) hair and dander: Secondary | ICD-10-CM | POA: Diagnosis not present

## 2019-12-12 DIAGNOSIS — J301 Allergic rhinitis due to pollen: Secondary | ICD-10-CM | POA: Diagnosis not present

## 2019-12-12 DIAGNOSIS — J3089 Other allergic rhinitis: Secondary | ICD-10-CM | POA: Diagnosis not present

## 2019-12-19 DIAGNOSIS — J301 Allergic rhinitis due to pollen: Secondary | ICD-10-CM | POA: Diagnosis not present

## 2019-12-19 DIAGNOSIS — J3089 Other allergic rhinitis: Secondary | ICD-10-CM | POA: Diagnosis not present

## 2019-12-28 DIAGNOSIS — J3089 Other allergic rhinitis: Secondary | ICD-10-CM | POA: Diagnosis not present

## 2019-12-28 DIAGNOSIS — J301 Allergic rhinitis due to pollen: Secondary | ICD-10-CM | POA: Diagnosis not present

## 2020-01-02 DIAGNOSIS — J301 Allergic rhinitis due to pollen: Secondary | ICD-10-CM | POA: Diagnosis not present

## 2020-01-02 DIAGNOSIS — J3089 Other allergic rhinitis: Secondary | ICD-10-CM | POA: Diagnosis not present

## 2020-01-02 DIAGNOSIS — J3081 Allergic rhinitis due to animal (cat) (dog) hair and dander: Secondary | ICD-10-CM | POA: Diagnosis not present

## 2020-01-09 DIAGNOSIS — J301 Allergic rhinitis due to pollen: Secondary | ICD-10-CM | POA: Diagnosis not present

## 2020-01-09 DIAGNOSIS — J3089 Other allergic rhinitis: Secondary | ICD-10-CM | POA: Diagnosis not present

## 2020-01-18 DIAGNOSIS — J301 Allergic rhinitis due to pollen: Secondary | ICD-10-CM | POA: Diagnosis not present

## 2020-01-18 DIAGNOSIS — J3089 Other allergic rhinitis: Secondary | ICD-10-CM | POA: Diagnosis not present

## 2020-01-23 DIAGNOSIS — J3089 Other allergic rhinitis: Secondary | ICD-10-CM | POA: Diagnosis not present

## 2020-01-23 DIAGNOSIS — J301 Allergic rhinitis due to pollen: Secondary | ICD-10-CM | POA: Diagnosis not present

## 2020-01-30 DIAGNOSIS — J3089 Other allergic rhinitis: Secondary | ICD-10-CM | POA: Diagnosis not present

## 2020-01-30 DIAGNOSIS — J301 Allergic rhinitis due to pollen: Secondary | ICD-10-CM | POA: Diagnosis not present

## 2020-02-05 DIAGNOSIS — J301 Allergic rhinitis due to pollen: Secondary | ICD-10-CM | POA: Diagnosis not present

## 2020-02-05 DIAGNOSIS — J3089 Other allergic rhinitis: Secondary | ICD-10-CM | POA: Diagnosis not present

## 2020-02-07 DIAGNOSIS — E669 Obesity, unspecified: Secondary | ICD-10-CM | POA: Diagnosis not present

## 2020-02-07 DIAGNOSIS — E785 Hyperlipidemia, unspecified: Secondary | ICD-10-CM | POA: Diagnosis not present

## 2020-02-07 DIAGNOSIS — I10 Essential (primary) hypertension: Secondary | ICD-10-CM | POA: Diagnosis not present

## 2020-02-07 DIAGNOSIS — M5380 Other specified dorsopathies, site unspecified: Secondary | ICD-10-CM | POA: Diagnosis not present

## 2020-02-07 DIAGNOSIS — E039 Hypothyroidism, unspecified: Secondary | ICD-10-CM | POA: Diagnosis not present

## 2020-02-07 DIAGNOSIS — Z7689 Persons encountering health services in other specified circumstances: Secondary | ICD-10-CM | POA: Diagnosis not present

## 2020-02-20 DIAGNOSIS — J3089 Other allergic rhinitis: Secondary | ICD-10-CM | POA: Diagnosis not present

## 2020-02-20 DIAGNOSIS — J301 Allergic rhinitis due to pollen: Secondary | ICD-10-CM | POA: Diagnosis not present

## 2020-02-21 DIAGNOSIS — Z0000 Encounter for general adult medical examination without abnormal findings: Secondary | ICD-10-CM | POA: Diagnosis not present

## 2020-02-21 DIAGNOSIS — K219 Gastro-esophageal reflux disease without esophagitis: Secondary | ICD-10-CM | POA: Diagnosis not present

## 2020-02-21 DIAGNOSIS — R82998 Other abnormal findings in urine: Secondary | ICD-10-CM | POA: Diagnosis not present

## 2020-02-21 DIAGNOSIS — J302 Other seasonal allergic rhinitis: Secondary | ICD-10-CM | POA: Diagnosis not present

## 2020-02-21 DIAGNOSIS — Z1212 Encounter for screening for malignant neoplasm of rectum: Secondary | ICD-10-CM | POA: Diagnosis not present

## 2020-02-21 DIAGNOSIS — Z800 Family history of malignant neoplasm of digestive organs: Secondary | ICD-10-CM | POA: Diagnosis not present

## 2020-02-21 DIAGNOSIS — E785 Hyperlipidemia, unspecified: Secondary | ICD-10-CM | POA: Diagnosis not present

## 2020-02-21 DIAGNOSIS — J45909 Unspecified asthma, uncomplicated: Secondary | ICD-10-CM | POA: Diagnosis not present

## 2020-02-21 DIAGNOSIS — I10 Essential (primary) hypertension: Secondary | ICD-10-CM | POA: Diagnosis not present

## 2020-02-21 DIAGNOSIS — R413 Other amnesia: Secondary | ICD-10-CM | POA: Diagnosis not present

## 2020-02-21 DIAGNOSIS — E669 Obesity, unspecified: Secondary | ICD-10-CM | POA: Diagnosis not present

## 2020-02-21 DIAGNOSIS — M1990 Unspecified osteoarthritis, unspecified site: Secondary | ICD-10-CM | POA: Diagnosis not present

## 2020-02-22 DIAGNOSIS — J3089 Other allergic rhinitis: Secondary | ICD-10-CM | POA: Diagnosis not present

## 2020-02-22 DIAGNOSIS — J301 Allergic rhinitis due to pollen: Secondary | ICD-10-CM | POA: Diagnosis not present

## 2020-02-27 DIAGNOSIS — J301 Allergic rhinitis due to pollen: Secondary | ICD-10-CM | POA: Diagnosis not present

## 2020-02-27 DIAGNOSIS — J3089 Other allergic rhinitis: Secondary | ICD-10-CM | POA: Diagnosis not present

## 2020-02-29 DIAGNOSIS — J3089 Other allergic rhinitis: Secondary | ICD-10-CM | POA: Diagnosis not present

## 2020-02-29 DIAGNOSIS — J301 Allergic rhinitis due to pollen: Secondary | ICD-10-CM | POA: Diagnosis not present

## 2020-03-07 DIAGNOSIS — J3089 Other allergic rhinitis: Secondary | ICD-10-CM | POA: Diagnosis not present

## 2020-03-07 DIAGNOSIS — J301 Allergic rhinitis due to pollen: Secondary | ICD-10-CM | POA: Diagnosis not present

## 2020-03-12 DIAGNOSIS — J301 Allergic rhinitis due to pollen: Secondary | ICD-10-CM | POA: Diagnosis not present

## 2020-03-12 DIAGNOSIS — J3089 Other allergic rhinitis: Secondary | ICD-10-CM | POA: Diagnosis not present

## 2020-03-19 DIAGNOSIS — J3089 Other allergic rhinitis: Secondary | ICD-10-CM | POA: Diagnosis not present

## 2020-03-19 DIAGNOSIS — J301 Allergic rhinitis due to pollen: Secondary | ICD-10-CM | POA: Diagnosis not present

## 2020-04-02 DIAGNOSIS — J3089 Other allergic rhinitis: Secondary | ICD-10-CM | POA: Diagnosis not present

## 2020-04-02 DIAGNOSIS — J301 Allergic rhinitis due to pollen: Secondary | ICD-10-CM | POA: Diagnosis not present

## 2020-04-16 DIAGNOSIS — J301 Allergic rhinitis due to pollen: Secondary | ICD-10-CM | POA: Diagnosis not present

## 2020-04-16 DIAGNOSIS — J4530 Mild persistent asthma, uncomplicated: Secondary | ICD-10-CM | POA: Diagnosis not present

## 2020-04-16 DIAGNOSIS — J3089 Other allergic rhinitis: Secondary | ICD-10-CM | POA: Diagnosis not present

## 2020-04-16 DIAGNOSIS — H1045 Other chronic allergic conjunctivitis: Secondary | ICD-10-CM | POA: Diagnosis not present

## 2020-04-25 DIAGNOSIS — J301 Allergic rhinitis due to pollen: Secondary | ICD-10-CM | POA: Diagnosis not present

## 2020-04-25 DIAGNOSIS — J3089 Other allergic rhinitis: Secondary | ICD-10-CM | POA: Diagnosis not present

## 2020-05-07 DIAGNOSIS — J301 Allergic rhinitis due to pollen: Secondary | ICD-10-CM | POA: Diagnosis not present

## 2020-05-07 DIAGNOSIS — J3089 Other allergic rhinitis: Secondary | ICD-10-CM | POA: Diagnosis not present

## 2020-05-14 DIAGNOSIS — J301 Allergic rhinitis due to pollen: Secondary | ICD-10-CM | POA: Diagnosis not present

## 2020-05-14 DIAGNOSIS — J3089 Other allergic rhinitis: Secondary | ICD-10-CM | POA: Diagnosis not present

## 2020-05-21 DIAGNOSIS — J301 Allergic rhinitis due to pollen: Secondary | ICD-10-CM | POA: Diagnosis not present

## 2020-05-21 DIAGNOSIS — J3089 Other allergic rhinitis: Secondary | ICD-10-CM | POA: Diagnosis not present

## 2020-05-30 DIAGNOSIS — E785 Hyperlipidemia, unspecified: Secondary | ICD-10-CM | POA: Diagnosis not present

## 2020-05-30 DIAGNOSIS — J302 Other seasonal allergic rhinitis: Secondary | ICD-10-CM | POA: Diagnosis not present

## 2020-05-30 DIAGNOSIS — R413 Other amnesia: Secondary | ICD-10-CM | POA: Diagnosis not present

## 2020-05-30 DIAGNOSIS — H9120 Sudden idiopathic hearing loss, unspecified ear: Secondary | ICD-10-CM | POA: Diagnosis not present

## 2020-05-30 DIAGNOSIS — J45909 Unspecified asthma, uncomplicated: Secondary | ICD-10-CM | POA: Diagnosis not present

## 2020-05-30 DIAGNOSIS — M1990 Unspecified osteoarthritis, unspecified site: Secondary | ICD-10-CM | POA: Diagnosis not present

## 2020-05-30 DIAGNOSIS — I10 Essential (primary) hypertension: Secondary | ICD-10-CM | POA: Diagnosis not present

## 2020-05-30 DIAGNOSIS — Z800 Family history of malignant neoplasm of digestive organs: Secondary | ICD-10-CM | POA: Diagnosis not present

## 2020-05-30 DIAGNOSIS — K219 Gastro-esophageal reflux disease without esophagitis: Secondary | ICD-10-CM | POA: Diagnosis not present

## 2020-05-30 DIAGNOSIS — E039 Hypothyroidism, unspecified: Secondary | ICD-10-CM | POA: Diagnosis not present

## 2020-05-30 DIAGNOSIS — E669 Obesity, unspecified: Secondary | ICD-10-CM | POA: Diagnosis not present

## 2020-06-04 DIAGNOSIS — J301 Allergic rhinitis due to pollen: Secondary | ICD-10-CM | POA: Diagnosis not present

## 2020-06-04 DIAGNOSIS — J3089 Other allergic rhinitis: Secondary | ICD-10-CM | POA: Diagnosis not present

## 2020-06-11 DIAGNOSIS — J3089 Other allergic rhinitis: Secondary | ICD-10-CM | POA: Diagnosis not present

## 2020-06-11 DIAGNOSIS — J301 Allergic rhinitis due to pollen: Secondary | ICD-10-CM | POA: Diagnosis not present

## 2020-06-18 DIAGNOSIS — J301 Allergic rhinitis due to pollen: Secondary | ICD-10-CM | POA: Diagnosis not present

## 2020-06-18 DIAGNOSIS — J3089 Other allergic rhinitis: Secondary | ICD-10-CM | POA: Diagnosis not present

## 2020-07-02 DIAGNOSIS — J3089 Other allergic rhinitis: Secondary | ICD-10-CM | POA: Diagnosis not present

## 2020-07-02 DIAGNOSIS — J301 Allergic rhinitis due to pollen: Secondary | ICD-10-CM | POA: Diagnosis not present

## 2020-07-09 DIAGNOSIS — J3089 Other allergic rhinitis: Secondary | ICD-10-CM | POA: Diagnosis not present

## 2020-07-09 DIAGNOSIS — J301 Allergic rhinitis due to pollen: Secondary | ICD-10-CM | POA: Diagnosis not present

## 2020-07-11 ENCOUNTER — Other Ambulatory Visit: Payer: Self-pay | Admitting: Internal Medicine

## 2020-07-11 DIAGNOSIS — Z1231 Encounter for screening mammogram for malignant neoplasm of breast: Secondary | ICD-10-CM

## 2020-07-16 DIAGNOSIS — J301 Allergic rhinitis due to pollen: Secondary | ICD-10-CM | POA: Diagnosis not present

## 2020-07-16 DIAGNOSIS — J3089 Other allergic rhinitis: Secondary | ICD-10-CM | POA: Diagnosis not present

## 2020-07-18 DIAGNOSIS — J301 Allergic rhinitis due to pollen: Secondary | ICD-10-CM | POA: Diagnosis not present

## 2020-07-18 DIAGNOSIS — J3089 Other allergic rhinitis: Secondary | ICD-10-CM | POA: Diagnosis not present

## 2020-07-30 DIAGNOSIS — J3089 Other allergic rhinitis: Secondary | ICD-10-CM | POA: Diagnosis not present

## 2020-07-30 DIAGNOSIS — J301 Allergic rhinitis due to pollen: Secondary | ICD-10-CM | POA: Diagnosis not present

## 2020-08-13 DIAGNOSIS — J301 Allergic rhinitis due to pollen: Secondary | ICD-10-CM | POA: Diagnosis not present

## 2020-08-13 DIAGNOSIS — J3089 Other allergic rhinitis: Secondary | ICD-10-CM | POA: Diagnosis not present

## 2020-08-20 DIAGNOSIS — J301 Allergic rhinitis due to pollen: Secondary | ICD-10-CM | POA: Diagnosis not present

## 2020-08-20 DIAGNOSIS — M1990 Unspecified osteoarthritis, unspecified site: Secondary | ICD-10-CM | POA: Diagnosis not present

## 2020-08-20 DIAGNOSIS — J302 Other seasonal allergic rhinitis: Secondary | ICD-10-CM | POA: Diagnosis not present

## 2020-08-20 DIAGNOSIS — H9120 Sudden idiopathic hearing loss, unspecified ear: Secondary | ICD-10-CM | POA: Diagnosis not present

## 2020-08-20 DIAGNOSIS — E669 Obesity, unspecified: Secondary | ICD-10-CM | POA: Diagnosis not present

## 2020-08-20 DIAGNOSIS — J45909 Unspecified asthma, uncomplicated: Secondary | ICD-10-CM | POA: Diagnosis not present

## 2020-08-20 DIAGNOSIS — R413 Other amnesia: Secondary | ICD-10-CM | POA: Diagnosis not present

## 2020-08-20 DIAGNOSIS — Z800 Family history of malignant neoplasm of digestive organs: Secondary | ICD-10-CM | POA: Diagnosis not present

## 2020-08-20 DIAGNOSIS — J3089 Other allergic rhinitis: Secondary | ICD-10-CM | POA: Diagnosis not present

## 2020-08-20 DIAGNOSIS — I10 Essential (primary) hypertension: Secondary | ICD-10-CM | POA: Diagnosis not present

## 2020-08-20 DIAGNOSIS — K219 Gastro-esophageal reflux disease without esophagitis: Secondary | ICD-10-CM | POA: Diagnosis not present

## 2020-08-20 DIAGNOSIS — E785 Hyperlipidemia, unspecified: Secondary | ICD-10-CM | POA: Diagnosis not present

## 2020-08-29 DIAGNOSIS — J3089 Other allergic rhinitis: Secondary | ICD-10-CM | POA: Diagnosis not present

## 2020-08-29 DIAGNOSIS — J301 Allergic rhinitis due to pollen: Secondary | ICD-10-CM | POA: Diagnosis not present

## 2020-09-10 DIAGNOSIS — J301 Allergic rhinitis due to pollen: Secondary | ICD-10-CM | POA: Diagnosis not present

## 2020-09-10 DIAGNOSIS — T148XXA Other injury of unspecified body region, initial encounter: Secondary | ICD-10-CM | POA: Diagnosis not present

## 2020-09-10 DIAGNOSIS — M546 Pain in thoracic spine: Secondary | ICD-10-CM | POA: Diagnosis not present

## 2020-09-10 DIAGNOSIS — J3089 Other allergic rhinitis: Secondary | ICD-10-CM | POA: Diagnosis not present

## 2020-09-12 DIAGNOSIS — J301 Allergic rhinitis due to pollen: Secondary | ICD-10-CM | POA: Diagnosis not present

## 2020-09-12 DIAGNOSIS — J3089 Other allergic rhinitis: Secondary | ICD-10-CM | POA: Diagnosis not present

## 2020-09-20 ENCOUNTER — Ambulatory Visit
Admission: RE | Admit: 2020-09-20 | Discharge: 2020-09-20 | Disposition: A | Discharge status: Home / Self Care | Payer: Self-pay | Source: Ambulatory Visit | Attending: Internal Medicine | Admitting: Internal Medicine

## 2020-09-20 ENCOUNTER — Other Ambulatory Visit: Payer: Self-pay

## 2020-09-20 DIAGNOSIS — Z1231 Encounter for screening mammogram for malignant neoplasm of breast: Secondary | ICD-10-CM | POA: Diagnosis not present

## 2020-09-24 DIAGNOSIS — J301 Allergic rhinitis due to pollen: Secondary | ICD-10-CM | POA: Diagnosis not present

## 2020-09-24 DIAGNOSIS — J3089 Other allergic rhinitis: Secondary | ICD-10-CM | POA: Diagnosis not present

## 2020-09-26 DIAGNOSIS — J3089 Other allergic rhinitis: Secondary | ICD-10-CM | POA: Diagnosis not present

## 2020-09-26 DIAGNOSIS — J301 Allergic rhinitis due to pollen: Secondary | ICD-10-CM | POA: Diagnosis not present

## 2020-10-08 DIAGNOSIS — J301 Allergic rhinitis due to pollen: Secondary | ICD-10-CM | POA: Diagnosis not present

## 2020-10-08 DIAGNOSIS — J3089 Other allergic rhinitis: Secondary | ICD-10-CM | POA: Diagnosis not present

## 2020-10-15 DIAGNOSIS — J3089 Other allergic rhinitis: Secondary | ICD-10-CM | POA: Diagnosis not present

## 2020-10-15 DIAGNOSIS — J301 Allergic rhinitis due to pollen: Secondary | ICD-10-CM | POA: Diagnosis not present

## 2020-10-22 DIAGNOSIS — J301 Allergic rhinitis due to pollen: Secondary | ICD-10-CM | POA: Diagnosis not present

## 2020-10-22 DIAGNOSIS — J3089 Other allergic rhinitis: Secondary | ICD-10-CM | POA: Diagnosis not present

## 2020-10-29 DIAGNOSIS — J3089 Other allergic rhinitis: Secondary | ICD-10-CM | POA: Diagnosis not present

## 2020-10-29 DIAGNOSIS — J301 Allergic rhinitis due to pollen: Secondary | ICD-10-CM | POA: Diagnosis not present

## 2020-11-04 DIAGNOSIS — Z23 Encounter for immunization: Secondary | ICD-10-CM | POA: Diagnosis not present

## 2020-11-08 DIAGNOSIS — J3089 Other allergic rhinitis: Secondary | ICD-10-CM | POA: Diagnosis not present

## 2020-11-08 DIAGNOSIS — J301 Allergic rhinitis due to pollen: Secondary | ICD-10-CM | POA: Diagnosis not present

## 2020-11-12 DIAGNOSIS — J301 Allergic rhinitis due to pollen: Secondary | ICD-10-CM | POA: Diagnosis not present

## 2020-11-12 DIAGNOSIS — J3089 Other allergic rhinitis: Secondary | ICD-10-CM | POA: Diagnosis not present

## 2020-11-29 DIAGNOSIS — J301 Allergic rhinitis due to pollen: Secondary | ICD-10-CM | POA: Diagnosis not present

## 2020-11-29 DIAGNOSIS — J3089 Other allergic rhinitis: Secondary | ICD-10-CM | POA: Diagnosis not present

## 2020-12-10 DIAGNOSIS — J301 Allergic rhinitis due to pollen: Secondary | ICD-10-CM | POA: Diagnosis not present

## 2020-12-10 DIAGNOSIS — J3089 Other allergic rhinitis: Secondary | ICD-10-CM | POA: Diagnosis not present

## 2020-12-17 DIAGNOSIS — J3089 Other allergic rhinitis: Secondary | ICD-10-CM | POA: Diagnosis not present

## 2020-12-17 DIAGNOSIS — J301 Allergic rhinitis due to pollen: Secondary | ICD-10-CM | POA: Diagnosis not present

## 2020-12-31 DIAGNOSIS — J301 Allergic rhinitis due to pollen: Secondary | ICD-10-CM | POA: Diagnosis not present

## 2020-12-31 DIAGNOSIS — J3089 Other allergic rhinitis: Secondary | ICD-10-CM | POA: Diagnosis not present

## 2021-01-07 DIAGNOSIS — J301 Allergic rhinitis due to pollen: Secondary | ICD-10-CM | POA: Diagnosis not present

## 2021-01-14 DIAGNOSIS — J3089 Other allergic rhinitis: Secondary | ICD-10-CM | POA: Diagnosis not present

## 2021-01-14 DIAGNOSIS — J301 Allergic rhinitis due to pollen: Secondary | ICD-10-CM | POA: Diagnosis not present

## 2021-01-31 DIAGNOSIS — Z0100 Encounter for examination of eyes and vision without abnormal findings: Secondary | ICD-10-CM | POA: Diagnosis not present

## 2021-01-31 DIAGNOSIS — J3089 Other allergic rhinitis: Secondary | ICD-10-CM | POA: Diagnosis not present

## 2021-01-31 DIAGNOSIS — J301 Allergic rhinitis due to pollen: Secondary | ICD-10-CM | POA: Diagnosis not present

## 2021-02-07 DIAGNOSIS — J301 Allergic rhinitis due to pollen: Secondary | ICD-10-CM | POA: Diagnosis not present

## 2021-02-07 DIAGNOSIS — J3089 Other allergic rhinitis: Secondary | ICD-10-CM | POA: Diagnosis not present

## 2021-02-11 DIAGNOSIS — J3089 Other allergic rhinitis: Secondary | ICD-10-CM | POA: Diagnosis not present

## 2021-02-11 DIAGNOSIS — J301 Allergic rhinitis due to pollen: Secondary | ICD-10-CM | POA: Diagnosis not present

## 2021-02-14 DIAGNOSIS — J301 Allergic rhinitis due to pollen: Secondary | ICD-10-CM | POA: Diagnosis not present

## 2021-02-14 DIAGNOSIS — J3089 Other allergic rhinitis: Secondary | ICD-10-CM | POA: Diagnosis not present

## 2021-02-19 DIAGNOSIS — J301 Allergic rhinitis due to pollen: Secondary | ICD-10-CM | POA: Diagnosis not present

## 2021-02-19 DIAGNOSIS — J3089 Other allergic rhinitis: Secondary | ICD-10-CM | POA: Diagnosis not present

## 2021-02-26 DIAGNOSIS — J3089 Other allergic rhinitis: Secondary | ICD-10-CM | POA: Diagnosis not present

## 2021-02-26 DIAGNOSIS — J301 Allergic rhinitis due to pollen: Secondary | ICD-10-CM | POA: Diagnosis not present

## 2021-03-11 DIAGNOSIS — J3089 Other allergic rhinitis: Secondary | ICD-10-CM | POA: Diagnosis not present

## 2021-03-11 DIAGNOSIS — J301 Allergic rhinitis due to pollen: Secondary | ICD-10-CM | POA: Diagnosis not present

## 2021-03-24 DIAGNOSIS — J301 Allergic rhinitis due to pollen: Secondary | ICD-10-CM | POA: Diagnosis not present

## 2021-03-24 DIAGNOSIS — I10 Essential (primary) hypertension: Secondary | ICD-10-CM | POA: Diagnosis not present

## 2021-03-24 DIAGNOSIS — J3089 Other allergic rhinitis: Secondary | ICD-10-CM | POA: Diagnosis not present

## 2021-03-24 DIAGNOSIS — E785 Hyperlipidemia, unspecified: Secondary | ICD-10-CM | POA: Diagnosis not present

## 2021-03-24 DIAGNOSIS — E559 Vitamin D deficiency, unspecified: Secondary | ICD-10-CM | POA: Diagnosis not present

## 2021-03-24 DIAGNOSIS — E039 Hypothyroidism, unspecified: Secondary | ICD-10-CM | POA: Diagnosis not present

## 2021-03-31 DIAGNOSIS — R413 Other amnesia: Secondary | ICD-10-CM | POA: Diagnosis not present

## 2021-03-31 DIAGNOSIS — R82998 Other abnormal findings in urine: Secondary | ICD-10-CM | POA: Diagnosis not present

## 2021-03-31 DIAGNOSIS — E039 Hypothyroidism, unspecified: Secondary | ICD-10-CM | POA: Diagnosis not present

## 2021-03-31 DIAGNOSIS — K219 Gastro-esophageal reflux disease without esophagitis: Secondary | ICD-10-CM | POA: Diagnosis not present

## 2021-03-31 DIAGNOSIS — Z1212 Encounter for screening for malignant neoplasm of rectum: Secondary | ICD-10-CM | POA: Diagnosis not present

## 2021-03-31 DIAGNOSIS — E559 Vitamin D deficiency, unspecified: Secondary | ICD-10-CM | POA: Diagnosis not present

## 2021-03-31 DIAGNOSIS — M1990 Unspecified osteoarthritis, unspecified site: Secondary | ICD-10-CM | POA: Diagnosis not present

## 2021-03-31 DIAGNOSIS — Z800 Family history of malignant neoplasm of digestive organs: Secondary | ICD-10-CM | POA: Diagnosis not present

## 2021-03-31 DIAGNOSIS — R5383 Other fatigue: Secondary | ICD-10-CM | POA: Diagnosis not present

## 2021-03-31 DIAGNOSIS — J45909 Unspecified asthma, uncomplicated: Secondary | ICD-10-CM | POA: Diagnosis not present

## 2021-03-31 DIAGNOSIS — E785 Hyperlipidemia, unspecified: Secondary | ICD-10-CM | POA: Diagnosis not present

## 2021-03-31 DIAGNOSIS — Z0000 Encounter for general adult medical examination without abnormal findings: Secondary | ICD-10-CM | POA: Diagnosis not present

## 2021-03-31 DIAGNOSIS — E669 Obesity, unspecified: Secondary | ICD-10-CM | POA: Diagnosis not present

## 2021-03-31 DIAGNOSIS — I10 Essential (primary) hypertension: Secondary | ICD-10-CM | POA: Diagnosis not present

## 2021-04-09 DIAGNOSIS — J301 Allergic rhinitis due to pollen: Secondary | ICD-10-CM | POA: Diagnosis not present

## 2021-04-09 DIAGNOSIS — J3089 Other allergic rhinitis: Secondary | ICD-10-CM | POA: Diagnosis not present

## 2021-04-15 DIAGNOSIS — J3081 Allergic rhinitis due to animal (cat) (dog) hair and dander: Secondary | ICD-10-CM | POA: Diagnosis not present

## 2021-04-15 DIAGNOSIS — J301 Allergic rhinitis due to pollen: Secondary | ICD-10-CM | POA: Diagnosis not present

## 2021-04-15 DIAGNOSIS — J3089 Other allergic rhinitis: Secondary | ICD-10-CM | POA: Diagnosis not present

## 2021-04-17 DIAGNOSIS — J4530 Mild persistent asthma, uncomplicated: Secondary | ICD-10-CM | POA: Diagnosis not present

## 2021-04-17 DIAGNOSIS — J3089 Other allergic rhinitis: Secondary | ICD-10-CM | POA: Diagnosis not present

## 2021-04-17 DIAGNOSIS — H1045 Other chronic allergic conjunctivitis: Secondary | ICD-10-CM | POA: Diagnosis not present

## 2021-04-24 DIAGNOSIS — J3089 Other allergic rhinitis: Secondary | ICD-10-CM | POA: Diagnosis not present

## 2021-04-24 DIAGNOSIS — J301 Allergic rhinitis due to pollen: Secondary | ICD-10-CM | POA: Diagnosis not present

## 2021-04-30 DIAGNOSIS — J301 Allergic rhinitis due to pollen: Secondary | ICD-10-CM | POA: Diagnosis not present

## 2021-04-30 DIAGNOSIS — J3089 Other allergic rhinitis: Secondary | ICD-10-CM | POA: Diagnosis not present

## 2021-05-06 DIAGNOSIS — M25511 Pain in right shoulder: Secondary | ICD-10-CM | POA: Diagnosis not present

## 2021-05-13 DIAGNOSIS — J3081 Allergic rhinitis due to animal (cat) (dog) hair and dander: Secondary | ICD-10-CM | POA: Diagnosis not present

## 2021-05-13 DIAGNOSIS — J301 Allergic rhinitis due to pollen: Secondary | ICD-10-CM | POA: Diagnosis not present

## 2021-05-13 DIAGNOSIS — J3089 Other allergic rhinitis: Secondary | ICD-10-CM | POA: Diagnosis not present

## 2021-05-20 DIAGNOSIS — J3081 Allergic rhinitis due to animal (cat) (dog) hair and dander: Secondary | ICD-10-CM | POA: Diagnosis not present

## 2021-05-20 DIAGNOSIS — J3089 Other allergic rhinitis: Secondary | ICD-10-CM | POA: Diagnosis not present

## 2021-05-20 DIAGNOSIS — J301 Allergic rhinitis due to pollen: Secondary | ICD-10-CM | POA: Diagnosis not present

## 2021-06-02 ENCOUNTER — Institutional Professional Consult (permissible substitution): Payer: Medicare HMO | Admitting: Neurology

## 2021-06-04 DIAGNOSIS — J3081 Allergic rhinitis due to animal (cat) (dog) hair and dander: Secondary | ICD-10-CM | POA: Diagnosis not present

## 2021-06-04 DIAGNOSIS — J3089 Other allergic rhinitis: Secondary | ICD-10-CM | POA: Diagnosis not present

## 2021-06-04 DIAGNOSIS — J301 Allergic rhinitis due to pollen: Secondary | ICD-10-CM | POA: Diagnosis not present

## 2021-06-11 DIAGNOSIS — J301 Allergic rhinitis due to pollen: Secondary | ICD-10-CM | POA: Diagnosis not present

## 2021-06-11 DIAGNOSIS — J3089 Other allergic rhinitis: Secondary | ICD-10-CM | POA: Diagnosis not present

## 2021-06-11 DIAGNOSIS — J3081 Allergic rhinitis due to animal (cat) (dog) hair and dander: Secondary | ICD-10-CM | POA: Diagnosis not present

## 2021-06-24 DIAGNOSIS — J3089 Other allergic rhinitis: Secondary | ICD-10-CM | POA: Diagnosis not present

## 2021-06-24 DIAGNOSIS — J301 Allergic rhinitis due to pollen: Secondary | ICD-10-CM | POA: Diagnosis not present

## 2021-06-24 DIAGNOSIS — J3081 Allergic rhinitis due to animal (cat) (dog) hair and dander: Secondary | ICD-10-CM | POA: Diagnosis not present

## 2021-07-07 DIAGNOSIS — J3081 Allergic rhinitis due to animal (cat) (dog) hair and dander: Secondary | ICD-10-CM | POA: Diagnosis not present

## 2021-07-07 DIAGNOSIS — J3089 Other allergic rhinitis: Secondary | ICD-10-CM | POA: Diagnosis not present

## 2021-07-07 DIAGNOSIS — J301 Allergic rhinitis due to pollen: Secondary | ICD-10-CM | POA: Diagnosis not present

## 2021-07-21 DIAGNOSIS — J3089 Other allergic rhinitis: Secondary | ICD-10-CM | POA: Diagnosis not present

## 2021-07-21 DIAGNOSIS — J301 Allergic rhinitis due to pollen: Secondary | ICD-10-CM | POA: Diagnosis not present

## 2021-07-21 DIAGNOSIS — J3081 Allergic rhinitis due to animal (cat) (dog) hair and dander: Secondary | ICD-10-CM | POA: Diagnosis not present

## 2021-07-30 DIAGNOSIS — J301 Allergic rhinitis due to pollen: Secondary | ICD-10-CM | POA: Diagnosis not present

## 2021-07-30 DIAGNOSIS — J3081 Allergic rhinitis due to animal (cat) (dog) hair and dander: Secondary | ICD-10-CM | POA: Diagnosis not present

## 2021-07-30 DIAGNOSIS — J3089 Other allergic rhinitis: Secondary | ICD-10-CM | POA: Diagnosis not present

## 2021-08-13 DIAGNOSIS — J3081 Allergic rhinitis due to animal (cat) (dog) hair and dander: Secondary | ICD-10-CM | POA: Diagnosis not present

## 2021-08-13 DIAGNOSIS — J301 Allergic rhinitis due to pollen: Secondary | ICD-10-CM | POA: Diagnosis not present

## 2021-08-13 DIAGNOSIS — J3089 Other allergic rhinitis: Secondary | ICD-10-CM | POA: Diagnosis not present

## 2021-08-20 DIAGNOSIS — J3089 Other allergic rhinitis: Secondary | ICD-10-CM | POA: Diagnosis not present

## 2021-08-20 DIAGNOSIS — J301 Allergic rhinitis due to pollen: Secondary | ICD-10-CM | POA: Diagnosis not present

## 2021-09-02 DIAGNOSIS — J301 Allergic rhinitis due to pollen: Secondary | ICD-10-CM | POA: Diagnosis not present

## 2021-09-02 DIAGNOSIS — J3089 Other allergic rhinitis: Secondary | ICD-10-CM | POA: Diagnosis not present

## 2021-09-02 DIAGNOSIS — J3081 Allergic rhinitis due to animal (cat) (dog) hair and dander: Secondary | ICD-10-CM | POA: Diagnosis not present

## 2021-09-09 DIAGNOSIS — J3081 Allergic rhinitis due to animal (cat) (dog) hair and dander: Secondary | ICD-10-CM | POA: Diagnosis not present

## 2021-09-09 DIAGNOSIS — J3089 Other allergic rhinitis: Secondary | ICD-10-CM | POA: Diagnosis not present

## 2021-09-09 DIAGNOSIS — J301 Allergic rhinitis due to pollen: Secondary | ICD-10-CM | POA: Diagnosis not present

## 2021-09-18 DIAGNOSIS — R5383 Other fatigue: Secondary | ICD-10-CM | POA: Diagnosis not present

## 2021-09-18 DIAGNOSIS — I10 Essential (primary) hypertension: Secondary | ICD-10-CM | POA: Diagnosis not present

## 2021-09-18 DIAGNOSIS — E559 Vitamin D deficiency, unspecified: Secondary | ICD-10-CM | POA: Diagnosis not present

## 2021-09-18 DIAGNOSIS — E669 Obesity, unspecified: Secondary | ICD-10-CM | POA: Diagnosis not present

## 2021-09-18 DIAGNOSIS — J45909 Unspecified asthma, uncomplicated: Secondary | ICD-10-CM | POA: Diagnosis not present

## 2021-09-18 DIAGNOSIS — J302 Other seasonal allergic rhinitis: Secondary | ICD-10-CM | POA: Diagnosis not present

## 2021-09-18 DIAGNOSIS — R413 Other amnesia: Secondary | ICD-10-CM | POA: Diagnosis not present

## 2021-09-18 DIAGNOSIS — J3089 Other allergic rhinitis: Secondary | ICD-10-CM | POA: Diagnosis not present

## 2021-09-18 DIAGNOSIS — J301 Allergic rhinitis due to pollen: Secondary | ICD-10-CM | POA: Diagnosis not present

## 2021-09-18 DIAGNOSIS — K219 Gastro-esophageal reflux disease without esophagitis: Secondary | ICD-10-CM | POA: Diagnosis not present

## 2021-09-18 DIAGNOSIS — M1990 Unspecified osteoarthritis, unspecified site: Secondary | ICD-10-CM | POA: Diagnosis not present

## 2021-09-18 DIAGNOSIS — E039 Hypothyroidism, unspecified: Secondary | ICD-10-CM | POA: Diagnosis not present

## 2021-09-18 DIAGNOSIS — Z800 Family history of malignant neoplasm of digestive organs: Secondary | ICD-10-CM | POA: Diagnosis not present

## 2021-09-18 DIAGNOSIS — J3081 Allergic rhinitis due to animal (cat) (dog) hair and dander: Secondary | ICD-10-CM | POA: Diagnosis not present

## 2021-09-18 DIAGNOSIS — E785 Hyperlipidemia, unspecified: Secondary | ICD-10-CM | POA: Diagnosis not present

## 2021-09-24 DIAGNOSIS — J3089 Other allergic rhinitis: Secondary | ICD-10-CM | POA: Diagnosis not present

## 2021-09-24 DIAGNOSIS — J301 Allergic rhinitis due to pollen: Secondary | ICD-10-CM | POA: Diagnosis not present

## 2021-09-24 DIAGNOSIS — J3081 Allergic rhinitis due to animal (cat) (dog) hair and dander: Secondary | ICD-10-CM | POA: Diagnosis not present

## 2021-10-01 ENCOUNTER — Encounter: Payer: Self-pay | Admitting: Neurology

## 2021-10-01 ENCOUNTER — Ambulatory Visit (INDEPENDENT_AMBULATORY_CARE_PROVIDER_SITE_OTHER): Payer: Medicare HMO | Admitting: Neurology

## 2021-10-01 VITALS — BP 142/68 | HR 53 | Ht 60.0 in | Wt 188.0 lb

## 2021-10-01 DIAGNOSIS — Z820 Family history of epilepsy and other diseases of the nervous system: Secondary | ICD-10-CM | POA: Diagnosis not present

## 2021-10-01 DIAGNOSIS — G4719 Other hypersomnia: Secondary | ICD-10-CM | POA: Diagnosis not present

## 2021-10-01 DIAGNOSIS — R519 Headache, unspecified: Secondary | ICD-10-CM | POA: Diagnosis not present

## 2021-10-01 DIAGNOSIS — J301 Allergic rhinitis due to pollen: Secondary | ICD-10-CM | POA: Diagnosis not present

## 2021-10-01 DIAGNOSIS — J3089 Other allergic rhinitis: Secondary | ICD-10-CM | POA: Diagnosis not present

## 2021-10-01 DIAGNOSIS — R0683 Snoring: Secondary | ICD-10-CM | POA: Diagnosis not present

## 2021-10-01 DIAGNOSIS — E669 Obesity, unspecified: Secondary | ICD-10-CM | POA: Diagnosis not present

## 2021-10-01 DIAGNOSIS — R351 Nocturia: Secondary | ICD-10-CM

## 2021-10-01 NOTE — Progress Notes (Signed)
Subjective:    Patient ID: [name] is a 76 y.o. female.  HPI    [name], MD, PhD [facility] [address] [place], [place] [address]  Dear Dr. Dagmar Hait,  I saw your patient, [name], kind request in the sleep clinic today for initial consultation of her sleep disorder, in particular, concern for underlying obstructive sleep apnea.  The patient is unaccompanied today.  As you know, Ms. [name] is a 76 year old right-handed woman with an underlying medical history of left ear hearing loss, hypertension, reflux disease, asthma, allergies, degenerative joint disease and degenerative disc disease, low back pain, history of chest pain, and obesity, who reports snoring and excessive daytime somnolence.  I reviewed your office note from 09/18/2021.  Her Epworth sleepiness score is 11 out of 24, fatigue severity score is 44 out of 63.  She lives with her husband.  She has 2 grown children, 2 grandchildren and 2 step great-grandchildren.  She is a non-smoker and drinks caffeine in the form of coffee, about 2 cups/day and occasional tea, no alcohol currently.  She does watch TV in her bedroom until her husband turns it off.  She is generally in bed by 830 and watches TV for at least an hour or so.  Rise time is around 7 but she often wakes up between 3 and 5 AM and then goes to the sofa to not disturb her husband.  They have a dog in the household, the dog sleeps on a his bed on the floor.  She is a retired Engineer, manufacturing.  Her weight has been more or less stable.  Her brother has sleep apnea.  She has nocturia about once or twice per average night and has woken up occasionally with a headache which she attributes to sinus pressure.  She does have allergies and gets allergy shots once a week.  She has asthma and uses Breo inhaler and she also takes montelukast.  She has a rescue inhaler as well.  Occasionally she will take an over-the-counter PM type  medication at night.  Her Past Medical History Is Significant For: Past Medical History:  Diagnosis Date   Asthma    Deafness in left ear    WEARS HEARING AID LEFT EAR   GERD (gastroesophageal reflux disease)    Hypertension    Pneumonia    in past x 2    Her Past Surgical History Is Significant For: Past Surgical History:  Procedure Laterality Date   ABDOMINAL HYSTERECTOMY     CHOLECYSTECTOMY     EYE SURGERY  2016   bilateral cataract surgery   NEPHRECTOMY Left 02/04/2015   Procedure: OPEN RADICAL NEPHRECTOMY;  Surgeon: Raynelle Bring, MD;  Location: [facility];  Service: Urology;  Laterality: Left;   TUBAL LIGATION      Her Family History Is Significant For: Family History  Problem Relation Age of Onset   Breast cancer Mother    Breast cancer Sister        in her 73s   Sleep apnea Brother     Her Social History Is Significant For: Social History   Socioeconomic History   Marital status: Married    Spouse name: Not on file   Number of children: Not on file   Years of education: Not on file   Highest education level: Not on file  Occupational History   Not on file  Tobacco Use   Smoking status: Never   Smokeless  tobacco: Not on file  Substance and Sexual Activity   Alcohol use: No   Drug use: No   Sexual activity: Not on file  Other Topics Concern   Not on file  Social History Narrative   Not on file   Social Determinants of Health   Financial Resource Strain: Not on file  Food Insecurity: Not on file  Transportation Needs: Not on file  Physical Activity: Not on file  Stress: Not on file  Social Connections: Not on file    Her Allergies Are:  Allergies  Allergen Reactions   Codeine Nausea Only and Rash  :   Her Current Medications Are:  Outpatient Encounter Medications as of 10/01/2021  Medication Sig   albuterol (PROVENTIL HFA;VENTOLIN HFA) 108 (90 Base) MCG/ACT inhaler Inhale 1 puff into the lungs as needed for wheezing or shortness of breath.    amLODipine (NORVASC) 10 MG tablet Take 1 tablet by mouth daily.   ASPIRIN 81 PO Take by mouth at bedtime.   fluticasone furoate-vilanterol (BREO ELLIPTA) 100-25 MCG/ACT AEPB 1 puff   irbesartan-hydrochlorothiazide (AVALIDE) 300-12.5 MG tablet Take 1 tablet by mouth daily.    levothyroxine (SYNTHROID) 50 MCG tablet Take 50 mcg by mouth every morning.   metoprolol (LOPRESSOR) 50 MG tablet Take 50 mg by mouth 2 (two) times daily.    montelukast (SINGULAIR) 10 MG tablet Take 10 mg by mouth at bedtime.    PRESCRIPTION MEDICATION Inject 1 Units as directed once a week. Allergy shot   VITAMIN D PO Take by mouth daily at 2 PM. 2 tablets daily   docusate sodium (COLACE) 100 MG capsule Take 1 capsule (100 mg total) by mouth 2 (two) times daily.   Fluticasone-Salmeterol (ADVAIR) 100-50 MCG/DOSE AEPB Inhale 1 puff into the lungs 2 (two) times daily as needed (for shortness of breathe).   HYDROcodone-acetaminophen (NORCO/VICODIN) 5-325 MG tablet Take 1-2 tablets by mouth every 6 (six) hours as needed.   senna (SENOKOT) 8.6 MG TABS tablet Take 1 tablet (8.6 mg total) by mouth daily.   No facility-administered encounter medications on file as of 10/01/2021.  :   Review of Systems:  Out of a complete 14 point review of systems, all are reviewed and negative with the exception of these symptoms as listed below:  Review of Systems  Neurological:        Pt here for sleep consult  Pt snores,fatigue,some headaches, hypertension .Pt denies sleep study,CPAP machine    ESS:11 FSS:44    Objective:  Neurological Exam  Physical Exam Physical Examination:   Vitals:   10/01/21 1116  BP: (!) 142/68  Pulse: (!) 53    General Examination: The patient is a very pleasant 76 y.o. female in no acute distress. She appears well-developed and well-nourished and well groomed.   HEENT: Normocephalic, atraumatic, pupils are equal, round and reactive to light, extraocular tracking is good without limitation to  gaze excursion or nystagmus noted. Hearing is grossly intact. Face is symmetric with normal facial animation. Speech is clear with no dysarthria noted. There is no hypophonia. There is no lip, neck/head, jaw or voice tremor. Neck is supple with full range of passive and active motion. There are no carotid bruits on auscultation. Oropharynx exam reveals: mild mouth dryness, adequate dental hygiene with partial dentures top and bottom.  Moderate airway crowding secondary to small airway entry, slightly elongated uvula, tip of uvula and tonsils not fully visualized.  Mallampati class II.  Neck circumference of 15 three-quarter inches.  Chest: Clear to auscultation without wheezing, rhonchi or crackles noted.  Heart: S1+S2+0, regular and normal without murmurs, rubs or gallops noted.   Abdomen: Soft, non-tender and non-distended.  Extremities: There is nonpitting puffiness around both ankles.  Skin: Warm and dry without trophic changes noted.   Musculoskeletal: exam reveals no obvious joint deformities.   Neurologically:  Mental status: The patient is awake, alert and oriented in all 4 spheres. Her immediate and remote memory, attention, language skills and fund of knowledge are appropriate. There is no evidence of aphasia, agnosia, apraxia or anomia. Speech is clear with normal prosody and enunciation. Thought process is linear. Mood is normal and affect is normal.  Cranial nerves II - XII are as described above under HEENT exam.  Motor exam: Normal bulk, strength and tone is noted. There is no obvious tremor. Fine motor skills and coordination: grossly intact.  Cerebellar testing: No dysmetria or intention tremor. There is no truncal or gait ataxia.  Sensory exam: intact to light touch in the upper and lower extremities.  Gait, station and balance: She stands easily. No veering to one side is noted. No leaning to one side is noted. Posture is age-appropriate and stance is narrow based. Gait shows  normal stride length and normal pace. No problems turning are noted.   Assessment and Plan:    In summary, Amorita B Bruno is a very pleasant 76 y.o.-year old female with an underlying medical history of left ear hearing loss, hypertension, reflux disease, asthma, allergies, degenerative joint disease and degenerative disc disease, low back pain, history of chest pain, and obesity, whose history and physical exam concerning for sleep disordered breathing, supporting a current working diagnosis of unspecified sleep apnea, with the main differential diagnoses of obstructive sleep apnea (OSA) versus upper airway resistance syndrome (UARS) versus central sleep apnea (CSA), or mixed sleep apnea. A laboratory attended sleep study is considered gold standard for evaluation of sleep disordered breathing and is recommended at this time and clinically justified.   I had a long chat with the patient about my findings and the diagnosis of sleep apnea, particularly OSA, its prognosis and treatment options. We talked about medical/conservative treatments, surgical interventions and non-pharmacological approaches for symptom control. I explained, in particular, the risks and ramifications of untreated moderate to severe OSA, especially with respect to developing cardiovascular disease down the road, including congestive heart failure (CHF), difficult to treat hypertension, cardiac arrhythmias (particularly A-fib), neurovascular complications including TIA, stroke and dementia. Even type 2 diabetes has, in part, been linked to untreated OSA. Symptoms of untreated OSA may include (but may not be limited to) daytime sleepiness, nocturia (i.e. frequent nighttime urination), memory problems, mood irritability and suboptimally controlled or worsening mood disorder such as depression and/or anxiety, lack of energy, lack of motivation, physical discomfort, as well as recurrent headaches, especially morning or nocturnal headaches. We  talked about the importance of maintaining a healthy lifestyle and striving for healthy weight. In addition, we talked about the importance of striving for and maintaining good sleep hygiene. I recommended the following at this time: sleep study.  I outlined the differences between a laboratory attended sleep study which is considered more comprehensive and accurate over the option of a home sleep test (HST); the latter may lead to underestimation of sleep disordered breathing in some instances and does not help with diagnosing upper airway resistance syndrome and is not accurate enough to diagnose primary central sleep apnea typically. I explained the different sleep test procedures  to the patient in detail and also outlined possible surgical and non-surgical treatment options of OSA, including the use of a pressure airway pressure (PAP) device (ie CPAP, AutoPAP/APAP or BiPAP in certain circumstances), a custom-made dental device (aka oral appliance, which would require a referral to a specialist dentist or orthodontist typically, and is generally speaking not considered a good choice for patients with full dentures or edentulous state), upper airway surgical options, such as traditional UPPP (which is not considered a first-line treatment) or the Inspire device (hypoglossal nerve stimulator, which would involve a referral for consultation with an ENT surgeon, after careful selection, following inclusion criteria). I explained the PAP treatment option to the patient in detail, as this is generally considered first-line treatment.  The patient indicated that she would be willing to try PAP therapy, if the need arises. I explained the importance of being compliant with PAP treatment, not only for insurance purposes but primarily to improve patient's symptoms symptoms, and for the patient's long term health benefit, including to reduce Her cardiovascular risks longer-term.    We will pick up our discussion about  the next steps and treatment options after testing.  We will keep her posted as to the test results by phone call and/or MyChart messaging where possible.  We will plan to follow-up in sleep clinic accordingly as well.  I answered all her questions today and the patient was in agreement.   I encouraged her to call with any interim questions, concerns, problems or updates or email [place] through [place].  Generally speaking, sleep test authorizations may take up to 2 weeks, sometimes less, sometimes longer, the patient is encouraged to get in touch with [place] if they do not hear back from the sleep lab staff directly within the next 2 weeks.  Thank you very much for allowing me to participate in the care of this nice patient. If I can be of any further assistance to you please do not hesitate to call me at [phone].  Sincerely,   [name], MD, PhD

## 2021-10-01 NOTE — Patient Instructions (Signed)

## 2021-10-14 DIAGNOSIS — J301 Allergic rhinitis due to pollen: Secondary | ICD-10-CM | POA: Diagnosis not present

## 2021-10-14 DIAGNOSIS — J3081 Allergic rhinitis due to animal (cat) (dog) hair and dander: Secondary | ICD-10-CM | POA: Diagnosis not present

## 2021-10-14 DIAGNOSIS — J3089 Other allergic rhinitis: Secondary | ICD-10-CM | POA: Diagnosis not present

## 2021-10-15 ENCOUNTER — Telehealth: Payer: Self-pay | Admitting: Neurology

## 2021-10-15 NOTE — Telephone Encounter (Signed)
[employer] medicare pending uploaded notes  

## 2021-10-22 DIAGNOSIS — J3089 Other allergic rhinitis: Secondary | ICD-10-CM | POA: Diagnosis not present

## 2021-10-22 DIAGNOSIS — J301 Allergic rhinitis due to pollen: Secondary | ICD-10-CM | POA: Diagnosis not present

## 2021-10-28 DIAGNOSIS — J3089 Other allergic rhinitis: Secondary | ICD-10-CM | POA: Diagnosis not present

## 2021-10-28 DIAGNOSIS — J3081 Allergic rhinitis due to animal (cat) (dog) hair and dander: Secondary | ICD-10-CM | POA: Diagnosis not present

## 2021-10-28 DIAGNOSIS — J301 Allergic rhinitis due to pollen: Secondary | ICD-10-CM | POA: Diagnosis not present

## 2021-11-03 NOTE — Telephone Encounter (Signed)
left VM to schedule 10/30/21 [place]  [employer] medicare auth: [id number] (exp. 10/15/21 to 04/13/22)

## 2021-11-10 DIAGNOSIS — J3081 Allergic rhinitis due to animal (cat) (dog) hair and dander: Secondary | ICD-10-CM | POA: Diagnosis not present

## 2021-11-10 DIAGNOSIS — J301 Allergic rhinitis due to pollen: Secondary | ICD-10-CM | POA: Diagnosis not present

## 2021-11-10 DIAGNOSIS — J3089 Other allergic rhinitis: Secondary | ICD-10-CM | POA: Diagnosis not present

## 2021-11-17 DIAGNOSIS — Z23 Encounter for immunization: Secondary | ICD-10-CM | POA: Diagnosis not present

## 2021-11-17 DIAGNOSIS — J3081 Allergic rhinitis due to animal (cat) (dog) hair and dander: Secondary | ICD-10-CM | POA: Diagnosis not present

## 2021-11-17 DIAGNOSIS — J301 Allergic rhinitis due to pollen: Secondary | ICD-10-CM | POA: Diagnosis not present

## 2021-11-17 DIAGNOSIS — J3089 Other allergic rhinitis: Secondary | ICD-10-CM | POA: Diagnosis not present

## 2021-11-18 ENCOUNTER — Other Ambulatory Visit: Payer: Self-pay | Admitting: Internal Medicine

## 2021-11-18 DIAGNOSIS — Z1231 Encounter for screening mammogram for malignant neoplasm of breast: Secondary | ICD-10-CM

## 2021-11-24 NOTE — Telephone Encounter (Signed)
Patient called back.  NPSG- [employer] medicare Josem Kaufmann: [id number] (exp. 10/15/21 to 04/13/22)     She is scheduled at [facility] for 02/03/22 at 8 pm.  Emailed patient the appointment information.

## 2021-12-02 DIAGNOSIS — J301 Allergic rhinitis due to pollen: Secondary | ICD-10-CM | POA: Diagnosis not present

## 2021-12-02 DIAGNOSIS — J3089 Other allergic rhinitis: Secondary | ICD-10-CM | POA: Diagnosis not present

## 2021-12-02 DIAGNOSIS — J3081 Allergic rhinitis due to animal (cat) (dog) hair and dander: Secondary | ICD-10-CM | POA: Diagnosis not present

## 2021-12-10 DIAGNOSIS — Z23 Encounter for immunization: Secondary | ICD-10-CM | POA: Diagnosis not present

## 2021-12-15 ENCOUNTER — Other Ambulatory Visit: Payer: Self-pay | Admitting: Internal Medicine

## 2021-12-15 DIAGNOSIS — N6310 Unspecified lump in the right breast, unspecified quadrant: Secondary | ICD-10-CM

## 2021-12-15 DIAGNOSIS — N6311 Unspecified lump in the right breast, upper outer quadrant: Secondary | ICD-10-CM | POA: Diagnosis not present

## 2021-12-18 DIAGNOSIS — J3089 Other allergic rhinitis: Secondary | ICD-10-CM | POA: Diagnosis not present

## 2021-12-18 DIAGNOSIS — J3081 Allergic rhinitis due to animal (cat) (dog) hair and dander: Secondary | ICD-10-CM | POA: Diagnosis not present

## 2021-12-18 DIAGNOSIS — J301 Allergic rhinitis due to pollen: Secondary | ICD-10-CM | POA: Diagnosis not present

## 2021-12-24 ENCOUNTER — Ambulatory Visit
Admission: RE | Admit: 2021-12-24 | Discharge: 2021-12-24 | Disposition: A | Discharge status: Home / Self Care | Payer: Medicare HMO | Source: Ambulatory Visit | Attending: Internal Medicine | Admitting: Internal Medicine

## 2021-12-24 ENCOUNTER — Other Ambulatory Visit: Payer: Self-pay | Admitting: Internal Medicine

## 2021-12-24 ENCOUNTER — Ambulatory Visit
Admission: RE | Admit: 2021-12-24 | Discharge: 2021-12-24 | Disposition: A | Discharge status: Home / Self Care | Payer: Self-pay | Source: Ambulatory Visit | Attending: Internal Medicine | Admitting: Internal Medicine

## 2021-12-24 DIAGNOSIS — N6489 Other specified disorders of breast: Secondary | ICD-10-CM | POA: Diagnosis not present

## 2021-12-24 DIAGNOSIS — R599 Enlarged lymph nodes, unspecified: Secondary | ICD-10-CM

## 2021-12-24 DIAGNOSIS — N6311 Unspecified lump in the right breast, upper outer quadrant: Secondary | ICD-10-CM | POA: Diagnosis not present

## 2021-12-24 DIAGNOSIS — Z803 Family history of malignant neoplasm of breast: Secondary | ICD-10-CM | POA: Diagnosis not present

## 2021-12-24 DIAGNOSIS — R921 Mammographic calcification found on diagnostic imaging of breast: Secondary | ICD-10-CM

## 2021-12-24 DIAGNOSIS — N6310 Unspecified lump in the right breast, unspecified quadrant: Secondary | ICD-10-CM

## 2021-12-24 DIAGNOSIS — N6452 Nipple discharge: Secondary | ICD-10-CM | POA: Diagnosis not present

## 2021-12-30 ENCOUNTER — Ambulatory Visit
Admission: RE | Admit: 2021-12-30 | Discharge: 2021-12-30 | Disposition: A | Discharge status: Home / Self Care | Payer: Medicare HMO | Source: Ambulatory Visit | Attending: Internal Medicine | Admitting: Internal Medicine

## 2021-12-30 DIAGNOSIS — R920 Mammographic microcalcification found on diagnostic imaging of breast: Secondary | ICD-10-CM | POA: Diagnosis not present

## 2021-12-30 DIAGNOSIS — R921 Mammographic calcification found on diagnostic imaging of breast: Secondary | ICD-10-CM

## 2021-12-30 DIAGNOSIS — J301 Allergic rhinitis due to pollen: Secondary | ICD-10-CM | POA: Diagnosis not present

## 2021-12-30 DIAGNOSIS — J3089 Other allergic rhinitis: Secondary | ICD-10-CM | POA: Diagnosis not present

## 2021-12-30 DIAGNOSIS — C50212 Malignant neoplasm of upper-inner quadrant of left female breast: Secondary | ICD-10-CM | POA: Diagnosis not present

## 2021-12-30 DIAGNOSIS — N6310 Unspecified lump in the right breast, unspecified quadrant: Secondary | ICD-10-CM

## 2021-12-30 DIAGNOSIS — D0512 Intraductal carcinoma in situ of left breast: Secondary | ICD-10-CM | POA: Diagnosis not present

## 2021-12-30 HISTORY — PX: BREAST BIOPSY: SHX20

## 2021-12-31 DIAGNOSIS — J3089 Other allergic rhinitis: Secondary | ICD-10-CM | POA: Diagnosis not present

## 2021-12-31 DIAGNOSIS — J301 Allergic rhinitis due to pollen: Secondary | ICD-10-CM | POA: Diagnosis not present

## 2022-01-02 ENCOUNTER — Ambulatory Visit
Admission: RE | Admit: 2022-01-02 | Discharge: 2022-01-02 | Disposition: A | Discharge status: Home / Self Care | Payer: Medicare HMO | Source: Ambulatory Visit | Attending: Internal Medicine | Admitting: Internal Medicine

## 2022-01-02 DIAGNOSIS — R921 Mammographic calcification found on diagnostic imaging of breast: Secondary | ICD-10-CM

## 2022-01-02 DIAGNOSIS — N6311 Unspecified lump in the right breast, upper outer quadrant: Secondary | ICD-10-CM | POA: Diagnosis not present

## 2022-01-02 DIAGNOSIS — N6310 Unspecified lump in the right breast, unspecified quadrant: Secondary | ICD-10-CM

## 2022-01-02 DIAGNOSIS — C50911 Malignant neoplasm of unspecified site of right female breast: Secondary | ICD-10-CM | POA: Diagnosis not present

## 2022-01-02 HISTORY — PX: BREAST BIOPSY: SHX20

## 2022-01-15 ENCOUNTER — Other Ambulatory Visit: Payer: Self-pay | Admitting: Surgery

## 2022-01-15 ENCOUNTER — Encounter: Payer: Self-pay | Admitting: *Deleted

## 2022-01-15 DIAGNOSIS — J3081 Allergic rhinitis due to animal (cat) (dog) hair and dander: Secondary | ICD-10-CM | POA: Diagnosis not present

## 2022-01-15 DIAGNOSIS — J3089 Other allergic rhinitis: Secondary | ICD-10-CM | POA: Diagnosis not present

## 2022-01-15 DIAGNOSIS — C50912 Malignant neoplasm of unspecified site of left female breast: Secondary | ICD-10-CM | POA: Diagnosis not present

## 2022-01-15 DIAGNOSIS — C50911 Malignant neoplasm of unspecified site of right female breast: Secondary | ICD-10-CM | POA: Diagnosis not present

## 2022-01-15 DIAGNOSIS — J301 Allergic rhinitis due to pollen: Secondary | ICD-10-CM | POA: Diagnosis not present

## 2022-01-16 NOTE — Progress Notes (Incomplete)
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 Breast, right, needle core biopsy, 9:30 3cmfn (site 1 ribbon) - INVASIVE MAMMARY CARCINOMA, SEE NOTE - DUCTAL CARCINOMA IN SITU, PAPILLARY AND CRIBRIFORM WITH FOCAL NECROSIS, INTERMEDIATE NUCLEAR GRADE - TUBULE FORMATION: SCORE 3 - NUCLEAR PLEOMORPHISM: SCORE 2 - MITOTIC COUNT: SCORE 1 - TOTAL SCORE: 6 - OVERALL GRADE: 2 - LYMPHOVASCULAR INVASION: NOT IDENTIFIED - CANCER LENGTH: 0.4 CM - CALCIFICATIONS: NOT IDENTIFIED - SEE NOTE 2. Breast, right, needle core biopsy, 9:30 3cmfn (site 2 coil) - INVASIVE MAMMARY CARCINOMA, SEE NOTE MAMMARY CARCINOMA IN SITU, SOLID, INTERMEDIATE NUCLEAR GRADE - TUBULE FORMATION: SCORE 3 - NUCLEAR PLEOMORPHISM: SCORE 2 - MITOTIC COUNT: SCORE 1 - TOTAL SCORE: 6 - OVERALL GRADE: 2 - LYMPHOVASCULAR INVASION: NOT IDENTIFIED - CANCER LENGTH: 1.3 CM - CALCIFICATIONS: NOT IDENTIFIED - SEE NOTE  Diagnosis Note Addendum:  A. E-cadherin immunohistochemistry shows the invasive carcinoma is negative consistent with invasive lobular carcinoma. The carcinoma in situ is positive consistent with ductal carcinoma in situ. B. Immunohistochemistry for E-cadherin is negative in the invasive and in situ carcinoma consistent with invasive and in situ lobular carcinoma.  Receptor Status:  Breast, right, needle core biopsy, 9:30 cmfn (site 1 ribbon): ER(100%), PR (15%), Her2-neu (Negative via FISH), Ki-67(10%) Breast, right, needle core biopsy, 9:30 3cmfn (site 2 coil): ER(95%), PR (95%), Her2-neu (Negative via FISH), Ki-67(30%)  Did patient present with symptoms (if so, please note symptoms) or was this found on screening mammography?: from Dr. Trevor Mace 01/15/2022 note: patient "recently palpated a mass in the right breast in early December.  She underwent bilateral mammograms which showed 2 separate masses at the 9:30 position of the right breast with a total area measuring 3.8 cm as well as a 5.7 cm area of calcifications in the left breast."  Past/Anticipated interventions by surgeon, if any:  01/15/2022 --Dr. Coralie Keens (office visit) I discussed the diagnosis of the bilateral breast cancer with the patient and her husband.  We discussed breast cancer in general.  I gave them a copy of her pathology results and we went over these in detail.  We next discussed from a surgical standpoint breast conservation versus mastectomies.  If she chooses to proceed with breast conservation, we would need to get bilateral breast MRI and have her see medical oncology to consider neoadjuvant therapy.  We next discussed mastectomies with sentinel node biopsies and immediate reconstruction.  After long discussion, she wants to just proceed with mastectomies without reconstruction bilaterally.  We will go ahead and start working on scheduling her bilateral mastectomies.  She will be referred to the cancer center as well to see medical and radiation oncology as well as physical therapy. She and her husband agree with the plans    Past/Anticipated interventions by medical oncology, if any:  Scheduled for consultation with Dr. Benay Pike on 01/21/2022  Lymphedema issues, if any:  ***    Pain issues, if any:  ***   SAFETY ISSUES: Prior radiation? *** Pacemaker/ICD? *** Possible current pregnancy? No--hysterectomy Is the patient on methotrexate? ***  Current Complaints / other details:  ***

## 2022-01-18 NOTE — Progress Notes (Signed)
Radiation Oncology         ([phone]) [phone] ________________________________  Initial Outpatient Consultation  Name: [name] MRN: [id number]  Date: 01/19/2022  DOB: 10-13-1945  BE:EFEO, Ravisankar, MD  Coralie Keens, MD   REFERRING PHYSICIAN: Coralie Keens, MD  DIAGNOSIS: There were no encounter diagnoses.  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 female who is accompanied by ***. she is seen as a courtesy of Dr. Ninfa Linden for an opinion concerning radiation therapy as part of management for her recently diagnosed bilateral breast cancers.   The patient presented with a palpable right breast lump and right sided bloody nipple discharge this past December 2023. This prompted a bilateral diagnostic mammogram, right breast ultrasound, and left axillary ultrasound on 12/24/21 which demonstrated: a spiculated mass in the 9:30 o'clock right breast, 3 cmfn, measuring 2.2 cm and corresponding with the palpable area of concern; an adjacent/nearly contiguous complex cystic and solid mass measuring approximately 1.3 cm within the 9:30 o'clock right breast also located 3 cmfn; and highly suspicious calcifications within the inner left breast spanning approximately 5.7 cm. No abnormal lymph nodes were appreciated in either axilla.   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 Prognostic indicators significant for: estrogen receptor 100% positive and progesterone receptor 100% positive, both with strong staining intensity; Proliferation marker Ki67 at 20%; Her2 status positive; Grade 3.   An additional  area of the left breast calcifications were biopsied and showed high-grade DCIS with micropapillary features, suspicious for microinvasion. Prognostic indicators significant for: estrogen receptor 95% positive and progesterone receptor 10% positive, both with strong staining intensity; Her2 not assessed.  Biopsy of the first 9:30 o'clock right breast mass on 01/02/22 showed grade 2 invasive lobular carcinoma measuring 0.4 cm in the greatest linear extent of the sample, and intermediate grade DCIS with focal necrosis. Prognostic indicators significant for: estrogen receptor 100% positive and progesterone receptor 15% positive, both with strong staining intensity; Proliferation marker Ki67 at 10%; Her2 status negative; Grade 2.   Biopsy of the second 9:30 o'clock right breast mass on 01/02/22 showed grade 2 invasive and in situ lobular carcinoma measuring 1.3 cm in the greatest linear extent of the sample. Prognostic indicators significant for: estrogen receptor 95% positive and progesterone receptor 95% positive, both with strong staining intensity; Proliferation marker Ki67 at 30%; Her2 status negative; Grade 2.   The patient was accordingly referred to Dr. Ninfa Linden on 01/15/22 to discuss surgical treatment options. Following a very detailed discussion of the risks and benefits, the patient has opted to proceed with bilateral mastectomies without reconstructive surgery. Her procedure has been scheduled for 02/09/21  Of note: the patient has a family history significant for breast cancer in her mother and sister   PREVIOUS RADIATION THERAPY: No  PAST MEDICAL HISTORY:  Past Medical History:  Diagnosis Date   Asthma    Deafness in left ear    WEARS HEARING AID LEFT EAR   GERD (gastroesophageal reflux disease)    Hypertension    Pneumonia    in past x 2    PAST SURGICAL HISTORY: Past Surgical History:  Procedure Laterality Date   ABDOMINAL HYSTERECTOMY     BREAST BIOPSY Left 12/30/2021   MM  LT BREAST BX W LOC DEV 1ST LESION IMAGE BX SPEC STEREO GUIDE 12/30/2021 [facility]   BREAST BIOPSY Right 01/02/2022   [place] RT BREAST BX W LOC DEV 1ST LESION IMG BX SPEC US GUIDE 01/02/2022 [facility]   BREAST BIOPSY Right 01/02/2022   [place] RT BREAST BX W LOC DEV EA ADD LESION IMG BX SPEC US GUIDE 01/02/2022 [facility]   BREAST BIOPSY Left 12/30/2021   MM LT BREAST BX W LOC DEV EA AD LESION IMG BX SPEC STEREO GUIDE 12/30/2021 [facility]   CHOLECYSTECTOMY     EYE SURGERY  2016   bilateral cataract surgery   NEPHRECTOMY Left 02/04/2015   Procedure: OPEN RADICAL NEPHRECTOMY;  Surgeon: Raynelle Bring, MD;  Location: [facility];  Service: Urology;  Laterality: Left;   TUBAL LIGATION      FAMILY HISTORY:  Family History  Problem Relation Age of Onset   Breast cancer Mother    Breast cancer Sister        in her 13s   Shea apnea Brother     SOCIAL HISTORY:  Social History   Tobacco Use   Smoking status: Never  Substance Use Topics   Alcohol use: No   Drug use: No    ALLERGIES:  Allergies  Allergen Reactions   Codeine Nausea Only and Rash    MEDICATIONS:  Current Outpatient Medications  Medication Sig Dispense Refill   albuterol (PROVENTIL HFA;VENTOLIN HFA) 108 (90 Base) MCG/ACT inhaler Inhale 1 puff into the lungs as needed for wheezing or shortness of breath.     amLODipine (NORVASC) 10 MG tablet Take 1 tablet by mouth daily.     ASPIRIN 81 PO Take by mouth at bedtime.     docusate sodium (COLACE) 100 MG capsule Take 1 capsule (100 mg total) by mouth 2 (two) times daily. 30 capsule 0   fluticasone furoate-vilanterol (BREO ELLIPTA) 100-25 MCG/ACT AEPB 1 puff     Fluticasone-Salmeterol (ADVAIR) 100-50 MCG/DOSE AEPB Inhale 1 puff into the lungs 2 (two) times daily as needed (for shortness of breathe).     HYDROcodone-acetaminophen (NORCO/VICODIN) 5-325 MG tablet Take 1-2 tablets by mouth every 6 (six) hours as needed. 25 tablet 0    irbesartan-hydrochlorothiazide (AVALIDE) 300-12.5 MG tablet Take 1 tablet by mouth daily.      levothyroxine (SYNTHROID) 50 MCG tablet Take 50 mcg by mouth every morning.     metoprolol (LOPRESSOR) 50 MG tablet Take 50 mg by mouth 2 (two) times daily.      montelukast (SINGULAIR) 10 MG tablet Take 10 mg by mouth at bedtime.      PRESCRIPTION MEDICATION Inject 1 Units as directed once a week. Allergy shot     senna (SENOKOT) 8.6 MG TABS tablet Take 1 tablet (8.6 mg total) by mouth daily. 30 each 0   VITAMIN D PO Take by mouth daily at 2 PM. 2 tablets daily     No current facility-administered medications for this encounter.    REVIEW OF SYSTEMS:  A 10+ POINT REVIEW OF SYSTEMS WAS OBTAINED including neurology, dermatology, psychiatry, cardiac, respiratory, lymph, extremities, GI, GU, musculoskeletal, constitutional, reproductive, HEENT. ***   PHYSICAL EXAM:  vitals were not taken for this visit.   General: Alert and oriented, in no acute distress HEENT: Head is normocephalic. Extraocular movements are intact. Oropharynx is clear. Neck: Neck is supple, no palpable cervical or supraclavicular  lymphadenopathy. Heart: Regular in rate and rhythm with no murmurs, rubs, or gallops. Chest: Clear to auscultation bilaterally, with no rhonchi, wheezes, or rales. Abdomen: Soft, nontender, nondistended, with no rigidity or guarding. Extremities: No cyanosis or edema. Lymphatics: see Neck Exam Skin: No concerning lesions. Musculoskeletal: symmetric strength and muscle tone throughout. Neurologic: Cranial nerves II through XII are grossly intact. No obvious focalities. Speech is fluent. Coordination is intact. Psychiatric: Judgment and insight are intact. Affect is appropriate.  Left Breast: *** Right Breast: ***  ECOG = ***  0 - Asymptomatic (Fully active, able to carry on all predisease activities without restriction)  1 - Symptomatic but completely ambulatory (Restricted in physically strenuous  activity but ambulatory and able to carry out work of a light or sedentary nature. For example, light housework, office work)  2 - Symptomatic, <50% in bed during the day (Ambulatory and capable of all self care but unable to carry out any work activities. Up and about more than 50% of waking hours)  3 - Symptomatic, >50% in bed, but not bedbound (Capable of only limited self-care, confined to bed or chair 50% or more of waking hours)  4 - Bedbound (Completely disabled. Cannot carry on any self-care. Totally confined to bed or chair)  5 - Death   Eustace Pen MM, Creech RH, Tormey DC, et al. [phone]). "Toxicity and response criteria of the [facility]". [place] Oncol. 5 (6): 649-55  LABORATORY DATA:  Lab Results  Component Value Date   WBC 9.5 01/30/2015   HGB 10.4 (L) 02/06/2015   HCT 34.0 (L) 02/06/2015   MCV 84.1 01/30/2015   PLT 268 01/30/2015   Lab Results  Component Value Date   NA 139 02/07/2015   K 3.2 (L) 02/07/2015   CL 104 02/07/2015   CO2 28 02/07/2015   GLUCOSE 88 02/07/2015   BUN 8 02/07/2015   CREATININE 0.81 02/07/2015   CALCIUM 8.3 (L) 02/07/2015      RADIOGRAPHY: [place] RT BREAST BX W LOC DEV 1ST LESION IMG BX SPEC US GUIDE  Addendum Date: 01/13/2022   ADDENDUM REPORT: 01/13/2022 08:03 ADDENDUM: Pathology revealed GRADE 2 INVASIVE MAMMARY CARCINOMA, DUCTAL CARCINOMA IN SITU, PAPILLARY AND CRIBRIFORM WITH FOCAL NECROSIS, INTERMEDIATE NUCLEAR GRADE of the RIGHT breast, 9:30 o'clock, 3 cmfn (ribbon clip). This was found to be concordant by Dr. Kristopher Oppenheim. Pathology revealed GRADE 2 INVASIVE MAMMARY CARCINOMA, MAMMARY CARCINOMA IN SITU, SOLID, INTERMEDIATE NUCLEAR GRADE of the RIGHT breast, 9:30 o'clock, 3 cmfn (coil clip). This was found to be concordant by Dr. Kristopher Oppenheim. Pathology results were discussed with the patient by telephone. The patient reported doing well after the biopsies with tenderness at the sites. Post biopsy instructions and  care were reviewed and questions were answered. The patient was encouraged to call [facility] for any additional concerns. Surgical consultation has been arranged with Dr. Nedra Hai at [facility] on January 15, 2022. Pathology results reported by Stacie Acres RN on 01/07/2022. Electronically Signed   By: Kristopher Oppenheim M.D.   On: 01/13/2022 08:03   Result Date: 01/13/2022 CLINICAL DATA:  77 year old female with suspicious right breast masses. EXAM: ULTRASOUND GUIDED RIGHT BREAST CORE NEEDLE BIOPSY COMPARISON:  Previous exam(s). PROCEDURE: I met with the patient and we discussed the procedure of ultrasound-guided biopsy, including benefits and alternatives. We discussed the high likelihood of a successful procedure. We discussed the risks of the procedure, including infection, bleeding, tissue injury, clip migration, and  inadequate sampling. Informed written consent was given. The usual time-out protocol was performed immediately prior to the procedure. Lesion quadrant: Upper outer quadrant Using sterile technique and 1% Lidocaine as local anesthetic, under direct ultrasound visualization, a 14 gauge spring-loaded device was used to perform biopsy of the more cystic mass at the 9:30 position using a lateral approach. At the conclusion of the procedure a ribbon shaped tissue marker clip was deployed into the biopsy cavity. Lesion quadrant: Upper outer quadrant Using sterile technique and 1% Lidocaine as local anesthetic, under direct ultrasound visualization, a 14 gauge spring-loaded device was used to perform biopsy of the more solid spiculated mass at the 9:30 position using a lateral approach. At the conclusion of the procedure a coil shaped tissue marker clip was deployed into the biopsy cavity. Follow up 2 view mammogram was performed and dictated separately. IMPRESSION: Ultrasound guided biopsy of the right breast x2. No apparent complications. Electronically Signed:  By: Kristopher Oppenheim M.D. On: 01/02/2022 15:25  [place] RT BREAST BX W LOC DEV EA ADD LESION IMG BX SPEC US GUIDE  Addendum Date: 01/13/2022   ADDENDUM REPORT: 01/13/2022 08:03 ADDENDUM: Pathology revealed GRADE 2 INVASIVE MAMMARY CARCINOMA, DUCTAL CARCINOMA IN SITU, PAPILLARY AND CRIBRIFORM WITH FOCAL NECROSIS, INTERMEDIATE NUCLEAR GRADE of the RIGHT breast, 9:30 o'clock, 3 cmfn (ribbon clip). This was found to be concordant by Dr. Kristopher Oppenheim. Pathology revealed GRADE 2 INVASIVE MAMMARY CARCINOMA, MAMMARY CARCINOMA IN SITU, SOLID, INTERMEDIATE NUCLEAR GRADE of the RIGHT breast, 9:30 o'clock, 3 cmfn (coil clip). This was found to be concordant by Dr. Kristopher Oppenheim. Pathology results were discussed with the patient by telephone. The patient reported doing well after the biopsies with tenderness at the sites. Post biopsy instructions and care were reviewed and questions were answered. The patient was encouraged to call [facility] for any additional concerns. Surgical consultation has been arranged with Dr. Nedra Hai at [facility] on January 15, 2022. Pathology results reported by Stacie Acres RN on 01/07/2022. Electronically Signed   By: Kristopher Oppenheim M.D.   On: 01/13/2022 08:03   Result Date: 01/13/2022 CLINICAL DATA:  77 year old female with suspicious right breast masses. EXAM: ULTRASOUND GUIDED RIGHT BREAST CORE NEEDLE BIOPSY COMPARISON:  Previous exam(s). PROCEDURE: I met with the patient and we discussed the procedure of ultrasound-guided biopsy, including benefits and alternatives. We discussed the high likelihood of a successful procedure. We discussed the risks of the procedure, including infection, bleeding, tissue injury, clip migration, and inadequate sampling. Informed written consent was given. The usual time-out protocol was performed immediately prior to the procedure. Lesion quadrant: Upper outer quadrant Using sterile technique and 1% Lidocaine as local  anesthetic, under direct ultrasound visualization, a 14 gauge spring-loaded device was used to perform biopsy of the more cystic mass at the 9:30 position using a lateral approach. At the conclusion of the procedure a ribbon shaped tissue marker clip was deployed into the biopsy cavity. Lesion quadrant: Upper outer quadrant Using sterile technique and 1% Lidocaine as local anesthetic, under direct ultrasound visualization, a 14 gauge spring-loaded device was used to perform biopsy of the more solid spiculated mass at the 9:30 position using a lateral approach. At the conclusion of the procedure a coil shaped tissue marker clip was deployed into the biopsy cavity. Follow up 2 view mammogram was performed and dictated separately. IMPRESSION: Ultrasound guided biopsy of the right breast x2. No apparent complications. Electronically Signed: By: Kristopher Oppenheim M.D. On: 01/02/2022 15:25  MM LT BREAST BX W LOC DEV EA AD LESION IMG BX SPEC STEREO GUIDE  Result Date: 01/07/2022 ADDENDUM REPORT: 01/01/2022 9:24 AM ADDENDUM: Pathology revealed 1- GRADE 3 INVASIVE DUCTAL CARCINOMA WITH MICROPAPILLARY FEATURES, DUCTAL CARCINOMA IN SITU, SOLID AND MICROPAPILLARY, INTERMEDIATE TO HIGH-GRADE, CALCIFICATIONS: PRESENT of the LEFT breast needle core biopsy, inner upper quadrant, 5.7cm segmental group of pleomorphic microcalcifications , posterior (medial) extent (X clip). This was found to be concordant by Dr. Marin Olp. Pathology revealed 2- DUCTAL CARCINOMA IN SITU, MICROPAPILLARY, HIGH GRADE- SUSPICIOUS FOR FOCAL MICROINVASION of the LEFT breast, needle core biopsy, inner upper quadrant, 5.7cm segmental group of pleomorphic microcalcifications, anterior (lateral) extent (ribbon clip). This was found to be concordant by Dr. Marin Olp. Pathology results were discussed with the patient by telephone. The patient reported doing well after the biopsies with tenderness at the sites. Post biopsy instructions and care were  reviewed and questions were answered. The patient was encouraged to call [facility] for any additional concerns. Patient is scheduled for RIGHT breast biopsies 01/02/2022 at [place]. As noted in 12/24/2021 diagnostic report, if the bilateral breast biopsies are positive for malignancy, and if breast conservation surgery is considered, patient would need surgical excision of the calcifications at the inverted RIGHT nipple to exclude additional extent of disease. Surgical consultation has been arranged with Dr. Nedra Hai at [facility] on January 15, 2022. Pathology results reported by Stacie Acres RN on 01/01/2022. Electronically Signed   By: Marin Olp M.D.   On: 01/07/2022 15:47  MM CLIP PLACEMENT RIGHT  Result Date: 01/02/2022 CLINICAL DATA:  Status post right breast biopsies. EXAM: 3D DIAGNOSTIC RIGHT MAMMOGRAM POST ULTRASOUND BIOPSY COMPARISON:  Previous exam(s). FINDINGS: 3D Mammographic images were obtained following ultrasound guided biopsy of the right breast. The biopsy marking clips are in the expected positions. IMPRESSION: Appropriate positioning of both post biopsy marking clips. Final Assessment: Post Procedure Mammograms for Marker Placement Electronically Signed   By: Kristopher Oppenheim M.D.   On: 01/02/2022 15:35  MM LT BREAST BX W LOC DEV 1ST LESION IMAGE BX SPEC STEREO GUIDE  Addendum Date: 01/01/2022   ADDENDUM REPORT: 01/01/2022 09:50 ADDENDUM: Pathology revealed 1- GRADE 3 INVASIVE DUCTAL CARCINOMA WITH MICROPAPILLARY FEATURES, DUCTAL CARCINOMA IN SITU, SOLID AND MICROPAPILLARY, INTERMEDIATE TO HIGH-GRADE, CALCIFICATIONS: PRESENT of the LEFT breast needle core biopsy, inner upper quadrant, 5.7cm segmental group of pleomorphic microcalcifications , posterior (medial) extent (X clip). This was found to be concordant by Dr. Marin Olp. Pathology revealed 2- DUCTAL CARCINOMA IN SITU, MICROPAPILLARY, HIGH GRADE- SUSPICIOUS  FOR FOCAL MICROINVASION of the LEFT breast, needle core biopsy, inner upper quadrant, 5.7cm segmental group of pleomorphic microcalcifications, anterior (lateral) extent (ribbon clip). This was found to be concordant by Dr. Marin Olp. Pathology results were discussed with the patient by telephone. The patient reported doing well after the biopsies with tenderness at the sites. Post biopsy instructions and care were reviewed and questions were answered. The patient was encouraged to call [facility] for any additional concerns. Patient is scheduled for RIGHT breast biopsies 01/02/2022 at [place]. As noted in 12/24/2021 diagnostic report, if the bilateral breast biopsies are positive for malignancy, and if breast conservation surgery is considered, patient would need surgical excision of the calcifications at the inverted RIGHT nipple to exclude additional extent of disease. Surgical consultation has been arranged with Dr. Nedra Hai at [facility] on January 15, 2022. Pathology  results reported by Stacie Acres RN on 01/01/2022. Electronically Signed   By: Marin Olp M.D.   On: 01/01/2022 09:50   Result Date: 01/01/2022 CLINICAL DATA:  Patient presents for stereotactic core needle biopsy of a 5.7 cm segmental group of pleomorphic microcalcifications over the inner mid to upper left breast. Biopsy will be performed at 2 sites targeting the anterior and posterior extent. EXAM: LEFT BREAST STEREOTACTIC CORE NEEDLE BIOPSY COMPARISON:  Previous exam(s). FINDINGS: The patient and I discussed the procedure of stereotactic-guided biopsy including benefits and alternatives. We discussed the high likelihood of a successful procedure. We discussed the risks of the procedure including infection, bleeding, tissue injury, clip migration, and inadequate sampling. Informed written consent was given. The usual time out protocol was performed immediately prior to  the procedure. A. Using sterile technique and 1% Lidocaine as local anesthetic, under stereotactic guidance, a 9 gauge vacuum assisted device was used to perform core needle biopsy of the targeted microcalcifications of the inner mid to upper left breast, posterior medial extent using a superior to inferior approach. Specimen radiograph was performed showing multiple of the targeted microcalcifications. Specimens with calcifications are identified for pathology. Lesion quadrant: Left inner upper quadrant. At the conclusion of the procedure, an X shaped tissue marker clip was deployed into the biopsy cavity. Follow-up 2-view mammogram was performed and dictated separately. B. Using sterile technique and 1% Lidocaine as local anesthetic, under stereotactic guidance, a 9 gauge vacuum assisted device was used to perform core needle biopsy of the targeted microcalcifications over the inner mid to upper left breast, anterior lateral extent using a superior to inferior approach. Specimen radiograph was performed showing multiple of the targeted microcalcifications. Specimens with calcifications are identified for pathology. Lesion quadrant: Left inner upper quadrant. At the conclusion of the procedure, a ribbon shaped tissue marker clip was deployed into the biopsy cavity. Follow-up 2-view mammogram was performed and dictated separately. IMPRESSION: Stereotactic-guided biopsy of 2 sites of pleomorphic microcalcifications left breast as described. No apparent complications. Electronically Signed: By: Marin Olp M.D. On: 12/30/2021 14:03  MM CLIP PLACEMENT LEFT  Result Date: 12/30/2021 CLINICAL DATA:  Patient is post stereotactic core needle biopsy of 2 sites left breast targeting the posterior (medial) extent and anterior (lateral) extent of a 5.7 cm segment of group of pleomorphic microcalcifications over the inner mid to upper left breast. EXAM: 3D DIAGNOSTIC LEFT MAMMOGRAM POST STEREOTACTIC BIOPSY COMPARISON:   Previous exam(s). FINDINGS: 3D Mammographic images were obtained following stereotactic guided biopsy of the targeted microcalcifications over the inner mid to upper left breast targeting the posterior and anterior extent. The X shaped clip and ribbon clips are over the sites of biopsy on the CC image. The ribbon clip is over the site of biopsy on the mL image. X clip is approximately 3.2 cm inferior to the expected site of biopsy on the ML image. IMPRESSION: 1. Appropriate positioning of the ribbon shaped biopsy marking clip at the site of biopsy in the inner upper left breast anterior extent. 2. Positioning of the X shaped biopsy marking clip at the site of biopsy in the inner left breast posterior extent as described above noting the clip appears approximately 3.2 cm inferior to the expected biopsy site on the ML image. Final Assessment: Post Procedure Mammograms for Marker Placement Electronically Signed   By: Marin Olp M.D.   On: 12/30/2021 14:29  MM DIAG BREAST TOMO BILATERAL  Result Date: 12/24/2021 CLINICAL DATA:  Patient describes a palpable lump in  the RIGHT breast. Patient describes RIGHT-sided bloody nipple discharge. Patient has a strong family history of breast cancer including mother and sister. EXAM: DIGITAL DIAGNOSTIC BILATERAL MAMMOGRAM WITH TOMOSYNTHESIS; ULTRASOUND RIGHT BREAST LIMITED; [place] AXILLARY LEFT TECHNIQUE: Bilateral digital diagnostic mammography and breast tomosynthesis was performed.; Targeted ultrasound examination of the right breast was performed; Targeted ultrasound examination of the left axilla was performed. COMPARISON:  Previous exam(s). ACR Breast Density Category c: The breast tissue is heterogeneously dense, which may obscure small masses. FINDINGS: RIGHT breast diagnostic mammogram: There is a spiculated mass within the outer RIGHT breast, middle to posterior depth, with associated architectural distortion, corresponding to the palpable area of concern. There are  scattered coarse and punctate calcifications throughout the RIGHT breast, including several new calcifications at the inverted RIGHT nipple. LEFT breast: There are pleomorphic, coarse heterogeneous and fine linear branching calcifications throughout the inner LEFT breast, with a suspicious morphology and a suspicious segmental distribution, measuring 5.7 cm extent. RIGHT breast: Targeted ultrasound is performed, showing 2 adjacent/nearly contiguous masses within the RIGHT breast at the 9:30 o'clock axis, 3 cm from the nipple. The dominant mass is a spiculated mass measuring 2.2 x 1.8 cm. The adjacent complex cystic and solid mass measures 1.3 x 1 cm. Overall, the 2 adjacent/nearly contiguous masses measures 3.8 x 2 x 2.2 cm. RIGHT axilla was evaluated with ultrasound showing no enlarged or morphologically abnormal lymph nodes. Incidental note is made of a large lipoma in the RIGHT axilla. LEFT axilla was evaluated with ultrasound showing no enlarged or morphologically abnormal lymph nodes. IMPRESSION: 1. Spiculated mass within the RIGHT breast at the 9:30 o'clock axis, 3 cm from the nipple, measuring 2.2 cm, corresponding to patient's palpable area of concern. This is a highly suspicious finding for which ultrasound-guided biopsy is recommended. 2. Adjacent/nearly contiguous complex cystic and solid mass within the RIGHT breast at the 9:30 o'clock axis, 3 cm from the nipple, measuring 1.3 cm. This is a suspicious finding for which ultrasound-guided biopsy is recommended. 3. Highly suspicious calcifications within the inner LEFT breast, with a segmental distribution, measuring 5.7 cm extent. Recommend 2 site stereotactic biopsy to include the more posterior calcifications and the more anterior/retroareolar calcifications. RECOMMENDATION: 1. Ultrasound-guided biopsy for the spiculated mass in the RIGHT breast at the 9:30 o'clock axis, 3 cm from the nipple, measuring 2.2 cm. 2. Ultrasound-guided biopsy for the  adjacent/nearly contiguous complex cystic and solid mass within the RIGHT breast at the 9:30 o'clock axis, 3 cm from the nipple, measuring 1.3 cm, with preferential targeting of the solid-appearing component. 3. Stereotactic biopsies (2 sites) for the highly suspicious calcifications within the inner LEFT breast, with sampling of the more posterior calcifications (fine linear branching) and the more anterior/retroareolar calcifications (coarse heterogeneous). 4. If the bilateral breast biopsies are positive for malignancy, and if breast conservation surgery is considered, patient would need surgical excision of the calcifications at the inverted RIGHT nipple to exclude additional extent of disease. Bilateral breast biopsies will be scheduled at patient's convenience. I have discussed the findings and recommendations with the patient. If applicable, a reminder letter will be sent to the patient regarding the next appointment. BI-RADS CATEGORY  5: Highly suggestive of malignancy. Electronically Signed   By: Franki Cabot M.D.   On: 12/24/2021 11:27  US BREAST LTD UNI RIGHT INC AXILLA  Result Date: 12/24/2021 CLINICAL DATA:  Patient describes a palpable lump in the RIGHT breast. Patient describes RIGHT-sided bloody nipple discharge. Patient has a strong family history of breast  cancer including mother and sister. EXAM: DIGITAL DIAGNOSTIC BILATERAL MAMMOGRAM WITH TOMOSYNTHESIS; ULTRASOUND RIGHT BREAST LIMITED; [place] AXILLARY LEFT TECHNIQUE: Bilateral digital diagnostic mammography and breast tomosynthesis was performed.; Targeted ultrasound examination of the right breast was performed; Targeted ultrasound examination of the left axilla was performed. COMPARISON:  Previous exam(s). ACR Breast Density Category c: The breast tissue is heterogeneously dense, which may obscure small masses. FINDINGS: RIGHT breast diagnostic mammogram: There is a spiculated mass within the outer RIGHT breast, middle to posterior depth,  with associated architectural distortion, corresponding to the palpable area of concern. There are scattered coarse and punctate calcifications throughout the RIGHT breast, including several new calcifications at the inverted RIGHT nipple. LEFT breast: There are pleomorphic, coarse heterogeneous and fine linear branching calcifications throughout the inner LEFT breast, with a suspicious morphology and a suspicious segmental distribution, measuring 5.7 cm extent. RIGHT breast: Targeted ultrasound is performed, showing 2 adjacent/nearly contiguous masses within the RIGHT breast at the 9:30 o'clock axis, 3 cm from the nipple. The dominant mass is a spiculated mass measuring 2.2 x 1.8 cm. The adjacent complex cystic and solid mass measures 1.3 x 1 cm. Overall, the 2 adjacent/nearly contiguous masses measures 3.8 x 2 x 2.2 cm. RIGHT axilla was evaluated with ultrasound showing no enlarged or morphologically abnormal lymph nodes. Incidental note is made of a large lipoma in the RIGHT axilla. LEFT axilla was evaluated with ultrasound showing no enlarged or morphologically abnormal lymph nodes. IMPRESSION: 1. Spiculated mass within the RIGHT breast at the 9:30 o'clock axis, 3 cm from the nipple, measuring 2.2 cm, corresponding to patient's palpable area of concern. This is a highly suspicious finding for which ultrasound-guided biopsy is recommended. 2. Adjacent/nearly contiguous complex cystic and solid mass within the RIGHT breast at the 9:30 o'clock axis, 3 cm from the nipple, measuring 1.3 cm. This is a suspicious finding for which ultrasound-guided biopsy is recommended. 3. Highly suspicious calcifications within the inner LEFT breast, with a segmental distribution, measuring 5.7 cm extent. Recommend 2 site stereotactic biopsy to include the more posterior calcifications and the more anterior/retroareolar calcifications. RECOMMENDATION: 1. Ultrasound-guided biopsy for the spiculated mass in the RIGHT breast at the 9:30  o'clock axis, 3 cm from the nipple, measuring 2.2 cm. 2. Ultrasound-guided biopsy for the adjacent/nearly contiguous complex cystic and solid mass within the RIGHT breast at the 9:30 o'clock axis, 3 cm from the nipple, measuring 1.3 cm, with preferential targeting of the solid-appearing component. 3. Stereotactic biopsies (2 sites) for the highly suspicious calcifications within the inner LEFT breast, with sampling of the more posterior calcifications (fine linear branching) and the more anterior/retroareolar calcifications (coarse heterogeneous). 4. If the bilateral breast biopsies are positive for malignancy, and if breast conservation surgery is considered, patient would need surgical excision of the calcifications at the inverted RIGHT nipple to exclude additional extent of disease. Bilateral breast biopsies will be scheduled at patient's convenience. I have discussed the findings and recommendations with the patient. If applicable, a reminder letter will be sent to the patient regarding the next appointment. BI-RADS CATEGORY  5: Highly suggestive of malignancy. Electronically Signed   By: Franki Cabot M.D.   On: 12/24/2021 11:27  [place] AXILLA LEFT  Result Date: 12/24/2021 CLINICAL DATA:  Patient describes a palpable lump in the RIGHT breast. Patient describes RIGHT-sided bloody nipple discharge. Patient has a strong family history of breast cancer including mother and sister. EXAM: DIGITAL DIAGNOSTIC BILATERAL MAMMOGRAM WITH TOMOSYNTHESIS; ULTRASOUND RIGHT BREAST LIMITED; [place] AXILLARY LEFT TECHNIQUE: Bilateral  digital diagnostic mammography and breast tomosynthesis was performed.; Targeted ultrasound examination of the right breast was performed; Targeted ultrasound examination of the left axilla was performed. COMPARISON:  Previous exam(s). ACR Breast Density Category c: The breast tissue is heterogeneously dense, which may obscure small masses. FINDINGS: RIGHT breast diagnostic mammogram: There is a  spiculated mass within the outer RIGHT breast, middle to posterior depth, with associated architectural distortion, corresponding to the palpable area of concern. There are scattered coarse and punctate calcifications throughout the RIGHT breast, including several new calcifications at the inverted RIGHT nipple. LEFT breast: There are pleomorphic, coarse heterogeneous and fine linear branching calcifications throughout the inner LEFT breast, with a suspicious morphology and a suspicious segmental distribution, measuring 5.7 cm extent. RIGHT breast: Targeted ultrasound is performed, showing 2 adjacent/nearly contiguous masses within the RIGHT breast at the 9:30 o'clock axis, 3 cm from the nipple. The dominant mass is a spiculated mass measuring 2.2 x 1.8 cm. The adjacent complex cystic and solid mass measures 1.3 x 1 cm. Overall, the 2 adjacent/nearly contiguous masses measures 3.8 x 2 x 2.2 cm. RIGHT axilla was evaluated with ultrasound showing no enlarged or morphologically abnormal lymph nodes. Incidental note is made of a large lipoma in the RIGHT axilla. LEFT axilla was evaluated with ultrasound showing no enlarged or morphologically abnormal lymph nodes. IMPRESSION: 1. Spiculated mass within the RIGHT breast at the 9:30 o'clock axis, 3 cm from the nipple, measuring 2.2 cm, corresponding to patient's palpable area of concern. This is a highly suspicious finding for which ultrasound-guided biopsy is recommended. 2. Adjacent/nearly contiguous complex cystic and solid mass within the RIGHT breast at the 9:30 o'clock axis, 3 cm from the nipple, measuring 1.3 cm. This is a suspicious finding for which ultrasound-guided biopsy is recommended. 3. Highly suspicious calcifications within the inner LEFT breast, with a segmental distribution, measuring 5.7 cm extent. Recommend 2 site stereotactic biopsy to include the more posterior calcifications and the more anterior/retroareolar calcifications. RECOMMENDATION: 1.  Ultrasound-guided biopsy for the spiculated mass in the RIGHT breast at the 9:30 o'clock axis, 3 cm from the nipple, measuring 2.2 cm. 2. Ultrasound-guided biopsy for the adjacent/nearly contiguous complex cystic and solid mass within the RIGHT breast at the 9:30 o'clock axis, 3 cm from the nipple, measuring 1.3 cm, with preferential targeting of the solid-appearing component. 3. Stereotactic biopsies (2 sites) for the highly suspicious calcifications within the inner LEFT breast, with sampling of the more posterior calcifications (fine linear branching) and the more anterior/retroareolar calcifications (coarse heterogeneous). 4. If the bilateral breast biopsies are positive for malignancy, and if breast conservation surgery is considered, patient would need surgical excision of the calcifications at the inverted RIGHT nipple to exclude additional extent of disease. Bilateral breast biopsies will be scheduled at patient's convenience. I have discussed the findings and recommendations with the patient. If applicable, a reminder letter will be sent to the patient regarding the next appointment. BI-RADS CATEGORY  5: Highly suggestive of malignancy. Electronically Signed   By: Franki Cabot M.D.   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  We discussed the natural history of *** and general treatment,  highlighting the role of radiotherapy in the management.  We discussed the available radiation techniques, and focused on the details of logistics and delivery.  We reviewed the anticipated acute and late sequelae associated with radiation in this setting.  The patient was encouraged to ask questions that I answered to  the best of my ability. *** A patient consent form was discussed and signed.  We retained a copy for our records.  The patient would like to proceed with radiation and will be scheduled for CT simulation.  PLAN: ***    *** minutes of total time was spent for this patient encounter, including preparation, face-to-face counseling with the patient and coordination of care, physical exam, and documentation of the encounter.   ------------------------------------------------  Blair Promise, PhD, MD  This document serves as a record of services personally performed by Gery Pray, MD. It was created on his behalf by Roney Mans, a trained medical scribe. The creation of this record is based on the scribe's personal observations and the provider's statements to them. This document has been checked and approved by the attending provider.

## 2022-01-19 ENCOUNTER — Ambulatory Visit
Admission: RE | Admit: 2022-01-19 | Discharge: 2022-01-19 | Disposition: A | Discharge status: Home / Self Care | Payer: Medicare HMO | Source: Ambulatory Visit | Attending: Radiation Oncology | Admitting: Radiation Oncology

## 2022-01-19 ENCOUNTER — Other Ambulatory Visit: Payer: Self-pay

## 2022-01-19 ENCOUNTER — Encounter: Payer: Self-pay | Admitting: Radiation Oncology

## 2022-01-19 VITALS — BP 159/73 | HR 97 | Resp 18 | Wt 188.1 lb

## 2022-01-19 DIAGNOSIS — Z7951 Long term (current) use of inhaled steroids: Secondary | ICD-10-CM | POA: Diagnosis not present

## 2022-01-19 DIAGNOSIS — C50912 Malignant neoplasm of unspecified site of left female breast: Secondary | ICD-10-CM

## 2022-01-19 DIAGNOSIS — Z170 Estrogen receptor positive status [ER+]: Secondary | ICD-10-CM | POA: Insufficient documentation

## 2022-01-19 DIAGNOSIS — Z79890 Hormone replacement therapy (postmenopausal): Secondary | ICD-10-CM | POA: Insufficient documentation

## 2022-01-19 DIAGNOSIS — J45909 Unspecified asthma, uncomplicated: Secondary | ICD-10-CM | POA: Diagnosis not present

## 2022-01-19 DIAGNOSIS — I10 Essential (primary) hypertension: Secondary | ICD-10-CM | POA: Diagnosis not present

## 2022-01-19 DIAGNOSIS — Z7982 Long term (current) use of aspirin: Secondary | ICD-10-CM | POA: Diagnosis not present

## 2022-01-19 DIAGNOSIS — K219 Gastro-esophageal reflux disease without esophagitis: Secondary | ICD-10-CM | POA: Diagnosis not present

## 2022-01-19 DIAGNOSIS — C50411 Malignant neoplasm of upper-outer quadrant of right female breast: Secondary | ICD-10-CM | POA: Diagnosis not present

## 2022-01-19 DIAGNOSIS — Z79899 Other long term (current) drug therapy: Secondary | ICD-10-CM | POA: Insufficient documentation

## 2022-01-19 DIAGNOSIS — C50212 Malignant neoplasm of upper-inner quadrant of left female breast: Secondary | ICD-10-CM | POA: Insufficient documentation

## 2022-01-19 DIAGNOSIS — Z803 Family history of malignant neoplasm of breast: Secondary | ICD-10-CM | POA: Insufficient documentation

## 2022-01-19 DIAGNOSIS — C50911 Malignant neoplasm of unspecified site of right female breast: Secondary | ICD-10-CM

## 2022-01-19 NOTE — Progress Notes (Signed)
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 Breast, right, needle core biopsy, 9:30 3cmfn (site 1 ribbon) - INVASIVE MAMMARY CARCINOMA, SEE NOTE - DUCTAL CARCINOMA IN SITU, PAPILLARY AND CRIBRIFORM WITH FOCAL NECROSIS, INTERMEDIATE NUCLEAR GRADE - TUBULE FORMATION: SCORE 3 - NUCLEAR PLEOMORPHISM: SCORE 2 - MITOTIC COUNT: SCORE 1 - TOTAL SCORE: 6 - OVERALL GRADE: 2 - LYMPHOVASCULAR INVASION: NOT IDENTIFIED - CANCER LENGTH: 0.4 CM - CALCIFICATIONS: NOT IDENTIFIED - SEE NOTE 2. Breast, right, needle core biopsy, 9:30 3cmfn (site 2 coil) - INVASIVE MAMMARY CARCINOMA, SEE NOTE MAMMARY CARCINOMA IN SITU, SOLID, INTERMEDIATE NUCLEAR GRADE - TUBULE FORMATION: SCORE 3 - NUCLEAR PLEOMORPHISM: SCORE 2 - MITOTIC COUNT: SCORE 1 - TOTAL SCORE: 6 - OVERALL GRADE: 2 - LYMPHOVASCULAR INVASION: NOT IDENTIFIED - CANCER LENGTH: 1.3 CM - CALCIFICATIONS: NOT IDENTIFIED - SEE NOTE  Diagnosis Note Addendum:  A. E-cadherin immunohistochemistry shows the invasive carcinoma is negative consistent with invasive lobular carcinoma. The carcinoma in situ is positive consistent with ductal carcinoma in situ. B. Immunohistochemistry for E-cadherin is negative in the invasive and in situ carcinoma consistent with invasive and in situ lobular carcinoma.  Receptor Status:  Breast, right, needle core biopsy, 9:30 cmfn (site 1 ribbon): ER(100%), PR (15%), Her2-neu (Negative via FISH), Ki-67(10%) Breast, right, needle core biopsy, 9:30 3cmfn (site 2 coil): ER(95%), PR (95%), Her2-neu (Negative via FISH), Ki-67(30%)  Did patient present with symptoms (if so, please note symptoms) or was this found on screening mammography?: from Dr. Trevor Mace 01/15/2022 note: patient "recently palpated a mass in the right breast in early December.  She underwent bilateral mammograms which showed 2 separate masses at the 9:30 position of the right breast with a total area measuring 3.8 cm as well as a 5.7 cm area of calcifications in the left breast."  Past/Anticipated interventions by surgeon, if any:  01/15/2022 --Dr. Coralie Keens (office visit) I discussed the diagnosis of the bilateral breast cancer with the patient and her husband.  We discussed breast cancer in general.  I gave them a copy of her pathology results and we went over these in detail.  We next discussed from a surgical standpoint breast conservation versus mastectomies.  If she chooses to proceed with breast conservation, we would need to get bilateral breast MRI and have her see medical oncology to consider neoadjuvant therapy.  We next discussed mastectomies with sentinel node biopsies and immediate reconstruction.  After long discussion, she wants to just proceed with mastectomies without reconstruction bilaterally.  We will go ahead and start working on scheduling her bilateral mastectomies.  She will be referred to the cancer center as well to see medical and radiation oncology as well as physical therapy. She and her husband agree with the plans    Past/Anticipated interventions by medical oncology, if any:  Scheduled for consultation with Dr. Benay Pike on 01/21/2022  Lymphedema issues, if any:  Some    Pain issues, if any:  No   SAFETY ISSUES: Prior radiation? No Pacemaker/ICD? No Possible current pregnancy? No--hysterectomy Is the patient on methotrexate? No  Current Complaints / other details:       Vitals:   01/19/22 0836  BP: (!) 159/73  Pulse: 97  Resp: 18  SpO2: 100%  Weight: 85.3 kg

## 2022-01-21 ENCOUNTER — Ambulatory Visit: Payer: Medicare HMO | Attending: Surgery | Admitting: Rehabilitation

## 2022-01-21 ENCOUNTER — Encounter: Payer: Self-pay | Admitting: Rehabilitation

## 2022-01-21 ENCOUNTER — Other Ambulatory Visit: Payer: Self-pay | Admitting: Surgery

## 2022-01-21 ENCOUNTER — Other Ambulatory Visit: Payer: Self-pay

## 2022-01-21 ENCOUNTER — Inpatient Hospital Stay: Payer: Medicare HMO | Attending: Hematology and Oncology | Admitting: Hematology and Oncology

## 2022-01-21 VITALS — BP 148/69 | HR 63 | Temp 97.7°F | Resp 16 | Ht 60.0 in | Wt 187.0 lb

## 2022-01-21 DIAGNOSIS — Z7982 Long term (current) use of aspirin: Secondary | ICD-10-CM | POA: Diagnosis not present

## 2022-01-21 DIAGNOSIS — C50912 Malignant neoplasm of unspecified site of left female breast: Secondary | ICD-10-CM | POA: Insufficient documentation

## 2022-01-21 DIAGNOSIS — Z170 Estrogen receptor positive status [ER+]: Secondary | ICD-10-CM | POA: Diagnosis not present

## 2022-01-21 DIAGNOSIS — R293 Abnormal posture: Secondary | ICD-10-CM | POA: Insufficient documentation

## 2022-01-21 DIAGNOSIS — Z90710 Acquired absence of both cervix and uterus: Secondary | ICD-10-CM | POA: Insufficient documentation

## 2022-01-21 DIAGNOSIS — C50911 Malignant neoplasm of unspecified site of right female breast: Secondary | ICD-10-CM

## 2022-01-21 DIAGNOSIS — I10 Essential (primary) hypertension: Secondary | ICD-10-CM | POA: Diagnosis not present

## 2022-01-21 DIAGNOSIS — Z79899 Other long term (current) drug therapy: Secondary | ICD-10-CM | POA: Diagnosis not present

## 2022-01-21 DIAGNOSIS — C50411 Malignant neoplasm of upper-outer quadrant of right female breast: Secondary | ICD-10-CM | POA: Diagnosis not present

## 2022-01-21 DIAGNOSIS — Z905 Acquired absence of kidney: Secondary | ICD-10-CM | POA: Insufficient documentation

## 2022-01-21 NOTE — Assessment & Plan Note (Addendum)
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  Given large extent of tumor measuring almost close to 6 cm, triple positivity, she may benefit from neoadjuvant chemotherapy.  However given the contalateral side with lobular phenotype and ER PR strong positivity and no Her 2 amplification, it may be best to proceed with bilateral mastectomy followed by adjuvant chemotherapy. If her invasive tumor size is greater than 3 cms, then we may have to consider dose modified TCH, if its less than 3 cms, then we can consider TH followed by herceptin maintenance. She understands the role of chemotherapy Genetic testing recommended.

## 2022-01-21 NOTE — Progress Notes (Signed)
[place] CONSULT NOTE  Patient Care Team: Prince Solian, MD as PCP - General (Internal Medicine)  CHIEF COMPLAINTS/PURPOSE OF CONSULTATION:  Newly diagnosed breast cancer  HISTORY OF PRESENTING ILLNESS:  [name] 77 y.o. female is here because of recent diagnosis of right breast  I reviewed her records extensively and collaborated the history with the patient.  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 This is a highly suspicious finding for which ultrasound-guided biopsy is recommended. Adjacent/nearly contiguous complex cystic and solid mass within the RIGHT breast at the 9:30 o'clock axis, 3 cm from the nipple, measuring 1.3 cm. This is a suspicious finding for which ultrasound-guided biopsy is recommended. Highly suspicious calcifications within the inner LEFT breast, with a segmental distribution, measuring 5.7 cm extent. Recommend 2 site stereotactic biopsy to include the more posterior calcifications and the more anterior/retroareolar calcifications.    12/30/2021 Pathology Results   Left breast needle core biopsy from post extent microcalcs showed IDC, overall grade 3.  Prognostics showed ER 100% positive, strong staining intensity, PR 100% positive, strong staining intensity, Ki 67 of 20%. 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  Second area in the right breast needle core biopsy coil clip also  showed intermediate grade invasive mammary carcinoma.  Prognostics showed ER 100% positive strong staining PR 15% positive strong staining Ki-67 of 10% and HER2 negative by FISH.  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  She is here today with her husband. She denies any new complaints today. She tells me that she has faith, has her surgery scheduled for 1/29 with Dr Ninfa Linden. 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 Rest of the pertinent 10 point ROS reviewed and neg.  MEDICAL HISTORY:  Past Medical History:  Diagnosis Date   Asthma    Deafness in left ear    WEARS HEARING AID LEFT EAR   GERD (gastroesophageal reflux disease)    Hypertension    Pneumonia    in past x 2    SURGICAL HISTORY: Past Surgical History:  Procedure Laterality Date   ABDOMINAL HYSTERECTOMY     BREAST BIOPSY Left 12/30/2021   MM LT BREAST BX W LOC DEV 1ST LESION IMAGE BX SPEC STEREO GUIDE 12/30/2021 [facility]   BREAST BIOPSY Right 01/02/2022   [place] RT BREAST BX W LOC DEV 1ST LESION IMG BX SPEC US GUIDE 01/02/2022 [facility]   BREAST BIOPSY Right 01/02/2022   [place] RT BREAST BX W LOC DEV EA ADD LESION IMG BX SPEC US GUIDE 01/02/2022 [facility]   BREAST BIOPSY Left 12/30/2021   MM LT BREAST BX W LOC DEV EA AD LESION IMG BX  SPEC STEREO GUIDE 12/30/2021 [facility]   CHOLECYSTECTOMY     EYE SURGERY  2016   bilateral cataract surgery   NEPHRECTOMY Left 02/04/2015   Procedure: OPEN RADICAL NEPHRECTOMY;  Surgeon: Raynelle Bring, MD;  Location: [facility];  Service: Urology;  Laterality: Left;   TUBAL LIGATION      SOCIAL HISTORY: Social History   Socioeconomic History   Marital status: Married     Spouse name: Not on file   Number of children: Not on file   Years of education: Not on file   Highest education level: Not on file  Occupational History   Not on file  Tobacco Use   Smoking status: Never   Smokeless tobacco: Not on file  Substance and Sexual Activity   Alcohol use: No   Drug use: No   Sexual activity: Not on file  Other Topics Concern   Not on file  Social History Narrative   Not on file   Social Determinants of Health   Financial Resource Strain: Not on file  Food Insecurity: Not on file  Transportation Needs: Not on file  Physical Activity: Not on file  Stress: Not on file  Social Connections: Not on file  Intimate Partner Violence: Not on file    FAMILY HISTORY: Family History  Problem Relation Age of Onset   Breast cancer Mother    Breast cancer Sister        in her 1s   Sleep apnea Brother     ALLERGIES:  is allergic to codeine.  MEDICATIONS:  Current Outpatient Medications  Medication Sig Dispense Refill   albuterol (PROVENTIL HFA;VENTOLIN HFA) 108 (90 Base) MCG/ACT inhaler Inhale 1 puff into the lungs as needed for wheezing or shortness of breath.     amLODipine (NORVASC) 10 MG tablet Take 1 tablet by mouth daily.     ASPIRIN 81 PO Take by mouth at bedtime.     fluticasone furoate-vilanterol (BREO ELLIPTA) 100-25 MCG/ACT AEPB 1 puff     irbesartan-hydrochlorothiazide (AVALIDE) 300-12.5 MG tablet Take 1 tablet by mouth daily.      levothyroxine (SYNTHROID) 50 MCG tablet Take 50 mcg by mouth every morning.     metoprolol (LOPRESSOR) 50 MG tablet Take 50 mg by mouth 2 (two) times daily.      montelukast (SINGULAIR) 10 MG tablet Take 10 mg by mouth at bedtime.      PRESCRIPTION MEDICATION Inject 1 Units as directed once a week. Allergy shot (Patient not taking: Reported on 01/19/2022)     VITAMIN D PO Take by mouth daily at 2 PM. 2 tablets daily     No current facility-administered medications for this visit.    REVIEW OF SYSTEMS:    Constitutional: Denies fevers, chills or abnormal night sweats Eyes: Denies blurriness of vision, double vision or watery eyes Ears, nose, mouth, throat, and face: Denies mucositis or sore throat Respiratory: Denies cough, dyspnea or wheezes Cardiovascular: Denies palpitation, chest discomfort or lower extremity swelling Gastrointestinal:  Denies nausea, heartburn or change in bowel habits Skin: Denies abnormal skin rashes Lymphatics: Denies new lymphadenopathy or easy bruising Neurological:Denies numbness, tingling or new weaknesses Behavioral/Psych: Mood is stable, no new changes  Breast: Denies any palpable lumps or discharge All other systems were reviewed with the patient and are negative.  PHYSICAL EXAMINATION: ECOG PERFORMANCE STATUS: 0 - Asymptomatic  Vitals:   01/21/22 1401  BP: (!) 148/69  Pulse: 63  Resp: 16  Temp: 97.7 F (36.5 C)  SpO2: 96%  Filed Weights   01/21/22 1401  Weight: 187 lb (84.8 kg)    GENERAL:alert, no distress and comfortable SKIN: skin color, texture, turgor are normal, no rashes or significant lesions EYES: normal, conjunctiva are pink and non-injected, sclera clear OROPHARYNX:no exudate, no erythema and lips, buccal mucosa, and tongue normal  NECK: supple, thyroid normal size, non-tender, without nodularity LYMPH:  no palpable lymphadenopathy in the cervical, axillary LUNGS: clear to auscultation and percussion with normal breathing effort HEART: regular rate & rhythm and no murmurs and no lower extremity edema ABDOMEN:abdomen soft, non-tender and normal bowel sounds Musculoskeletal:no cyanosis of digits and no clubbing  PSYCH: alert & oriented x 3 with fluent speech NEURO: no focal motor/sensory deficits BREAST: Bilateral breasts examined. In the right breast upper outer quadrant there is a mass, size estimated 5-6 cms, not movable in all directions, chronically inverted nipple, right axilla lipoma noted, no def regional adenopathy. Left  breast at 12 o clock there is likely a hematoma at 12 0 clock position. No regional adenopathy  LABORATORY DATA:  I have reviewed the data as listed Lab Results  Component Value Date   WBC 9.5 01/30/2015   HGB 10.4 (L) 02/06/2015   HCT 34.0 (L) 02/06/2015   MCV 84.1 01/30/2015   PLT 268 01/30/2015   Lab Results  Component Value Date   NA 139 02/07/2015   K 3.2 (L) 02/07/2015   CL 104 02/07/2015   CO2 28 02/07/2015    RADIOGRAPHIC STUDIES: I have personally reviewed the radiological reports and agreed with the findings in the report.  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  Given strong ER/PR positivity and lobular phenotype, I do not believe she will benefit tremendously from chemotherapy for this tumor.  We have discussed in detail about role of antiestrogen therapy for management of invasive lobular cancer.  I believe she will benefit from extended antiestrogen therapy after 10 years. She understands the benefits of antiestrogen therapy, role of anti estrogen therapy to reduce the risk of recurrence. We will discuss about this is more detail after chemotherapy.      Invasive ductal carcinoma of left breast (HCC) Left breast biopsy showed invasive ductal carcinoma, high-grade in the anterior calcifications, ER/PR and HER2 amplified.  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  Given large extent of tumor measuring almost close to 6 cm, triple positivity, she may benefit from neoadjuvant chemotherapy.  However given the contalateral side with lobular phenotype and ER PR strong positivity and no Her 2 amplification, it may be best to proceed with bilateral mastectomy followed by adjuvant chemotherapy. If her invasive  tumor size is greater than 3 cms, then we may have to consider dose modified TCH, if its less than 3 cms, then we can consider TH followed by herceptin maintenance. She understands the role of chemotherapy Genetic testing recommended.  Total time spent: 60 min including H, Physical exam, review of records, counseling and coordination of care. All questions were answered. The patient knows to call the clinic with any problems, questions or concerns.    Benay Pike, MD 01/21/22

## 2022-01-21 NOTE — Therapy (Signed)
OUTPATIENT PHYSICAL THERAPY BREAST CANCER BASELINE EVALUATION   Patient Name: [name] MRN: [id number] DOB:02/21/1945, 77 y.o., female Today's Date: 01/21/2022  END OF SESSION:  PT End of Session - 01/21/22 1222     Visit Number 1    Number of Visits 2    Date for PT Re-Evaluation 03/13/22    PT Start Time 6553    PT Stop Time 7482    PT Time Calculation (min) 35 min    Activity Tolerance Patient tolerated treatment well    Behavior During Therapy WFL for tasks assessed/performed             Past Medical History:  Diagnosis Date   Asthma    Deafness in left ear    WEARS HEARING AID LEFT EAR   GERD (gastroesophageal reflux disease)    Hypertension    Pneumonia    in past x 2   Past Surgical History:  Procedure Laterality Date   ABDOMINAL HYSTERECTOMY     BREAST BIOPSY Left 12/30/2021   MM LT BREAST BX W LOC DEV 1ST LESION IMAGE BX SPEC STEREO GUIDE 12/30/2021 [facility]   BREAST BIOPSY Right 01/02/2022   [place] RT BREAST BX W LOC DEV 1ST LESION IMG BX SPEC US GUIDE 01/02/2022 [facility]   BREAST BIOPSY Right 01/02/2022   [place] RT BREAST BX W LOC DEV EA ADD LESION IMG BX SPEC US GUIDE 01/02/2022 [facility]   BREAST BIOPSY Left 12/30/2021   MM LT BREAST BX W LOC DEV EA AD LESION IMG BX SPEC STEREO GUIDE 12/30/2021 [facility]   CHOLECYSTECTOMY     EYE SURGERY  2016   bilateral cataract surgery   NEPHRECTOMY Left 02/04/2015   Procedure: OPEN RADICAL NEPHRECTOMY;  Surgeon: Raynelle Bring, MD;  Location: [facility];  Service: Urology;  Laterality: Left;   TUBAL LIGATION     Patient Active Problem List   Diagnosis Date Noted   Invasive ductal carcinoma of left breast ([place]) 01/21/2022   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) 01/19/2022   Renal neoplasm 02/04/2015    PCP: Dr. Steva Ready  REFERRING PROVIDER: Dr. Ninfa Linden  REFERRING DIAG: Rt ILC and Lt IDC - bilateral breast  THERAPY DIAG:  Invasive lobular  carcinoma of right breast in female [facility])  Invasive ductal carcinoma of left breast ([place])  Abnormal posture  Rationale for Evaluation and Treatment: Rehabilitation  ONSET DATE: 01/12/22  SUBJECTIVE:  SUBJECTIVE STATEMENT: Patient reports she is here today to be seen by her medical team for her newly diagnosed bilateral breast cancer.  I do have shoulder trouble.  I have a small rotator cuff tear and OA.    PERTINENT HISTORY:  Will be having bil mastectomy and SLNB without reconstruction.  Rt cancer: ER/PR positive, HER2 neg Lt cancer: ER/PR positive  PATIENT GOALS:   reduce lymphedema risk and learn post op HEP.   PAIN:  Are you having pain? No  PRECAUTIONS: Active CA   HAND DOMINANCE: Right   WEIGHT BEARING RESTRICTIONS: No  FALLS:  Has patient fallen in last 6 months? 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  I try to walk.    PRIOR LEVEL OF FUNCTION: Independent   OBJECTIVE:  COGNITION: Overall cognitive status: Within functional limits for tasks assessed    POSTURE:  Forward head and rounded shoulders posture  UPPER EXTREMITY AROM/PROM:  A/PROM RIGHT   eval   Shoulder extension 60  Shoulder flexion 155  Shoulder abduction 160  Shoulder internal rotation   Shoulder external rotation 80    (Blank rows = not tested)  A/PROM LEFT   eval  Shoulder extension 60  Shoulder flexion 150  Shoulder abduction 160  Shoulder internal rotation   Shoulder external rotation 85    (Blank rows = not tested)  CERVICAL AROM: All within normal limits:   LYMPHEDEMA ASSESSMENTS:   LANDMARK RIGHT   eval  15cm proximal 37  10 cm proximal to olecranon process 37.5   Olecranon process 31.5  15cm proximal 30.5  10 cm proximal to ulnar styloid process 27.5  Just proximal to ulnar styloid process 18.5  Across hand at thumb web space 20  At base of 2nd digit 6.8  (Blank rows = not tested)   LANDMARK LEFT   eval   36.5  10 cm proximal to olecranon process 37.3  Olecranon process 32   29.5  10 cm proximal to ulnar styloid process 27  Just proximal to ulnar styloid process 18.7  Across hand at thumb web space 20  At base of 2nd digit 6.8  (Blank rows = not tested)  R: 3043m L: 29853m QUICK DASH SURVEY: 6.82%  PATIENT EDUCATION:  Education details: Lymphedema risk reduction and post op shoulder/posture HEP Person educated: Patient Education method: Explanation, Demonstration, Handout Education comprehension: Patient verbalized understanding and returned demonstration  HOME EXERCISE PROGRAM: Patient was instructed today in a home exercise program today for post op shoulder range of motion. These included active assist shoulder flexion in sitting, scapular retraction, wall walking with shoulder abduction, and hands behind head external rotation.  She was encouraged to do these twice a day, holding 3 seconds and repeating 5 times when permitted by her physician.   ASSESSMENT:  CLINICAL IMPRESSION: Pt will benefit from a post op PT reassessment to determine needs.  She will benefit from skilled therapeutic intervention to improve on the following deficits: Decreased knowledge of precautions, impaired UE functional use, pain, decreased ROM, postural dysfunction.   PT treatment/interventions: ADL/self-care home management, pt/family education, therapeutic exercise  REHAB POTENTIAL: Excellent  CLINICAL DECISION MAKING: Stable/uncomplicated  EVALUATION COMPLEXITY: Low   GOALS: Goals reviewed with patient? YES  LONG TERM GOALS: (STG=LTG)    Name Target Date Goal status  1 Pt will be able to verbalize understanding of pertinent  lymphedema risk reduction practices relevant to her dx specifically related to skin care.  Baseline:  No knowledge 01/21/2022 Achieved at eval  2 Pt will be able to return demo and/or verbalize understanding of the post op HEP related to regaining shoulder ROM. Baseline:  No knowledge 01/21/2022 Achieved at eval  3 Pt will be able to verbalize understanding of the importance of attending the post op After Breast CA Class for further lymphedema risk reduction education and therapeutic exercise.  Baseline:  No knowledge 01/21/2022 Achieved at eval  4 Pt will demo she has regained full shoulder ROM and function post operatively compared to baselines.  Baseline: See objective measurements taken today. 03/13/22     PLAN:  PT FREQUENCY/DURATION: EVAL and 1 follow up appointment.   PLAN FOR NEXT SESSION: will reassess 3-4 weeks post op to determine needs. - NO SOZO   Patient will follow up at outpatient cancer rehab 3-4 weeks following surgery.  If the patient requires physical therapy at that time, a specific plan will be dictated and sent to the referring physician for approval. The patient was educated today on appropriate basic range of motion exercises to begin post operatively and the importance of attending the After Breast Cancer class following surgery.  Patient was educated today on lymphedema risk reduction practices as it pertains to recommendations that will benefit the patient immediately following surgery.  She verbalized good understanding.    Physical Therapy Information for After Breast Cancer Surgery/Treatment:  Lymphedema is a swelling condition that you may be at risk for in your arm if you have lymph nodes removed from the armpit area.  After a sentinel node biopsy, the risk is approximately 5-9% and is higher after an axillary node dissection.  There is treatment available for this condition and it is not life-threatening.  Contact your physician or physical therapist with  concerns. You may begin the 4 shoulder/posture exercises (see additional sheet) when permitted by your physician (typically a week after surgery).  If you have drains, you may need to wait until those are removed before beginning range of motion exercises.  A general recommendation is to not lift your arms above shoulder height until drains are removed.  These exercises should be done to your tolerance and gently.  This is not a "no pain/no gain" type of recovery so listen to your body and stretch into the range of motion that you can tolerate, stopping if you have pain.  If you are having immediate reconstruction, ask your plastic surgeon about doing exercises as he or she may want you to wait. We encourage you to attend the free one time ABC (After Breast Cancer) class offered by [facility].  You will learn information related to lymphedema risk, prevention and treatment and additional exercises to regain mobility following surgery.  You can call [phone] for more information.  This is offered the 1st and 3rd Monday of each month.  You only attend the class one time. While undergoing any medical procedure or treatment, try to avoid blood pressure being taken or needle sticks from occurring on the arm on the side of cancer.   This recommendation begins after surgery and continues for the rest of your life.  This may help reduce your risk of getting lymphedema (swelling in your arm). An excellent resource for those seeking information on lymphedema is the National Lymphedema Network's web site. It can be accessed at [place].org If you notice swelling in your hand, arm  or breast at any time following surgery (even if it is many years from now), please contact your doctor or physical therapist to discuss this.  Lymphedema can be treated at any time but it is easier for you if it is treated early on.  If you feel like your shoulder motion is not returning to normal in a reasonable  amount of time, please contact your surgeon or physical therapist.  [place] [phone]. [address], [address], [place] [address]  ABC CLASS After Breast Cancer Class  After Breast Cancer Class is a specially designed exercise class to assist you in a safe recover after having breast cancer surgery.  In this class you will learn how to get back to full function whether your drains were just removed or if you had surgery a month ago.  This one-time class is [place] the 1st and 3rd Monday of every month from 11:00 a.m. until 12:00 noon virtually.  This class is FREE and space is limited. For more information or to register for the next available class, call [phone].  Class Goals  Understand specific stretches to improve the flexibility of you chest and shoulder. Learn ways to safely strengthen your upper body and improve your posture. Understand the warning signs of infection and why you may be at risk for an arm infection. Learn about Lymphedema and prevention.  ** You do not attend this class until after surgery.  Drains must be removed to participate  Patient was instructed today in a home exercise program today for post op shoulder range of motion. These included active assist shoulder flexion in sitting, scapular retraction, wall walking with shoulder abduction, and hands behind head external rotation.  She was encouraged to do these twice a day, holding 3 seconds and repeating 5 times when permitted by her physician.    Stark Bray, PT 01/21/2022, 12:22 PM

## 2022-01-21 NOTE — Assessment & Plan Note (Addendum)
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  Given strong ER/PR positivity and lobular phenotype, I do not believe she will benefit tremendously from chemotherapy for this tumor.  We have discussed in detail about role of antiestrogen therapy for management of invasive lobular cancer.  I believe she will benefit from extended antiestrogen therapy after 10 years. She understands the benefits of antiestrogen therapy, role of anti estrogen therapy to reduce the risk of recurrence. We will discuss about this is more detail after chemotherapy.

## 2022-01-23 ENCOUNTER — Telehealth: Payer: Self-pay | Admitting: Hematology and Oncology

## 2022-01-23 NOTE — Telephone Encounter (Signed)
Contacted patient to scheduled appointments. Patient is aware of appointments that are scheduled.   

## 2022-01-26 ENCOUNTER — Encounter: Payer: Self-pay | Admitting: *Deleted

## 2022-01-26 ENCOUNTER — Telehealth: Payer: Self-pay | Admitting: *Deleted

## 2022-01-26 DIAGNOSIS — C50911 Malignant neoplasm of unspecified site of right female breast: Secondary | ICD-10-CM

## 2022-01-26 DIAGNOSIS — C50912 Malignant neoplasm of unspecified site of left female breast: Secondary | ICD-10-CM

## 2022-01-26 NOTE — Telephone Encounter (Signed)
Left message for a return phone call to follow up from new patient appt and assess navigation needs.

## 2022-01-27 DIAGNOSIS — H1045 Other chronic allergic conjunctivitis: Secondary | ICD-10-CM | POA: Diagnosis not present

## 2022-01-27 DIAGNOSIS — J4530 Mild persistent asthma, uncomplicated: Secondary | ICD-10-CM | POA: Diagnosis not present

## 2022-01-27 DIAGNOSIS — J300 Vasomotor rhinitis: Secondary | ICD-10-CM | POA: Diagnosis not present

## 2022-01-27 DIAGNOSIS — J3089 Other allergic rhinitis: Secondary | ICD-10-CM | POA: Diagnosis not present

## 2022-01-30 ENCOUNTER — Inpatient Hospital Stay: Payer: Medicare HMO | Admitting: Licensed Clinical Social Worker

## 2022-01-30 NOTE — Progress Notes (Signed)
[facility]  Initial Assessment   [name] is a 77 y.o. year old female contacted by phone. Clinical Social Work was referred by new patient protocol for assessment of psychosocial needs.   SDOH (Social Determinants of Health) assessments performed: Yes SDOH Interventions    Flowsheet Row Clinical Support from 01/30/2022 in [facility] [place] Interventions Intervention Not Indicated  Housing Interventions Intervention Not Indicated  Financial Strain Interventions Intervention Not Indicated       SDOH Screenings   Food Insecurity: No Food Insecurity (01/30/2022)  Housing: Low Risk  (01/30/2022)  Financial Resource Strain: Low Risk  (01/30/2022)  Tobacco Use: Unknown (01/21/2022)     Distress Screen completed: Yes    01/19/2022    8:32 AM  ONCBCN DISTRESS SCREENING  Screening Type Initial Screening  Distress experienced in past week (1-10) 0      Family/Social Information:  Housing Arrangement: patient lives with spouse Family members/support persons in your life? Family and Friends Transportation concerns: no  Employment: Retired .  Income source: Paediatric nurse concerns: No Type of concern: None Food access concerns: no Religious or spiritual practice: Yes-her faith and belief in God is very important to her and helping her cope with diagnosis Services Currently in place:    Coping/ Adjustment to diagnosis: Patient understands treatment plan and what happens next? yes Concerns about diagnosis and/or treatment:  losing her hair Current coping skills/ strengths: Ability for insight , Communication skills , Motivation for treatment/growth , Religious Affiliation , and Supportive family/friends     SUMMARY: Current SDOH Barriers:  None noted today  Clinical Social Work Clinical Goal(s):  No clinical social work goals at this  time  Interventions: Discussed common feeling and emotions when being diagnosed with cancer, and the importance of support during treatment Informed patient of the support team roles and support services at [facility] Provided [facility] contact information and encouraged patient to call with any questions or concerns   Follow Up Plan: Patient will contact CSW with any support or resource needs Patient verbalizes understanding of plan: Yes    Alexx Giambra E Coden Franchi, LCSW

## 2022-02-03 ENCOUNTER — Ambulatory Visit (INDEPENDENT_AMBULATORY_CARE_PROVIDER_SITE_OTHER): Payer: Medicare HMO | Admitting: Neurology

## 2022-02-03 DIAGNOSIS — G4720 Circadian rhythm sleep disorder, unspecified type: Secondary | ICD-10-CM

## 2022-02-03 DIAGNOSIS — G4733 Obstructive sleep apnea (adult) (pediatric): Secondary | ICD-10-CM | POA: Diagnosis not present

## 2022-02-03 DIAGNOSIS — G4719 Other hypersomnia: Secondary | ICD-10-CM

## 2022-02-03 DIAGNOSIS — R351 Nocturia: Secondary | ICD-10-CM

## 2022-02-03 DIAGNOSIS — E669 Obesity, unspecified: Secondary | ICD-10-CM

## 2022-02-03 DIAGNOSIS — Z820 Family history of epilepsy and other diseases of the nervous system: Secondary | ICD-10-CM

## 2022-02-03 DIAGNOSIS — R519 Headache, unspecified: Secondary | ICD-10-CM

## 2022-02-03 DIAGNOSIS — R0683 Snoring: Secondary | ICD-10-CM

## 2022-02-04 ENCOUNTER — Ambulatory Visit (HOSPITAL_COMMUNITY)
Admission: RE | Admit: 2022-02-04 | Discharge: 2022-02-04 | Disposition: A | Discharge status: Home / Self Care | Payer: Medicare HMO | Source: Ambulatory Visit | Attending: Hematology and Oncology | Admitting: Hematology and Oncology

## 2022-02-04 DIAGNOSIS — C50912 Malignant neoplasm of unspecified site of left female breast: Secondary | ICD-10-CM | POA: Diagnosis not present

## 2022-02-04 DIAGNOSIS — Z09 Encounter for follow-up examination after completed treatment for conditions other than malignant neoplasm: Secondary | ICD-10-CM | POA: Diagnosis not present

## 2022-02-04 DIAGNOSIS — I10 Essential (primary) hypertension: Secondary | ICD-10-CM | POA: Insufficient documentation

## 2022-02-04 DIAGNOSIS — C50911 Malignant neoplasm of unspecified site of right female breast: Secondary | ICD-10-CM

## 2022-02-04 DIAGNOSIS — Z0189 Encounter for other specified special examinations: Secondary | ICD-10-CM | POA: Diagnosis not present

## 2022-02-04 LAB — ECHOCARDIOGRAM COMPLETE
Area-P 1/2: 2.97 cm2
Calc EF: 69.5 %
S' Lateral: 2.4 cm
Single Plane A2C EF: 67.2 %
Single Plane A4C EF: 70.6 %

## 2022-02-04 MED ORDER — PERFLUTREN LIPID MICROSPHERE
1.0000 mL | INTRAVENOUS | Status: AC | PRN
  Administered 2022-02-04: 4 mL via INTRAVENOUS

## 2022-02-04 NOTE — Pre-Procedure Instructions (Signed)
Surgical Instructions    Your procedure is scheduled on Monday 02/09/22.   Report to [facility] Main Entrance "A" at 07:00 A.M., then check in with the Admitting office.  Call this number if you have problems the morning of surgery:  [phone]   If you have any questions prior to your surgery date call [phone]: Open Monday-Friday 8am-4pm If you experience any cold or flu symptoms such as cough, fever, chills, shortness of breath, etc. between now and your scheduled surgery, please notify us at the above number     Remember:  Do not eat after midnight the night before your surgery  You may drink clear liquids until 06:00 A.M. the morning of your surgery.   Clear liquids allowed are: Water, Non-Citrus Juices (without pulp), Carbonated Beverages, Clear Tea, Black Coffee ONLY (NO MILK, CREAM OR POWDERED CREAMER of any kind), and Gatorade  Patient Instructions  The night before surgery:  No food after midnight. ONLY clear liquids after midnight  The day of surgery (if you do NOT have diabetes):  Drink ONE (1) Pre-Surgery Clear Ensure by 06:00 A.M. the morning of surgery. Drink in one sitting. Do not sip.  This drink was given to you during your hospital  pre-op appointment visit.  Nothing else to drink after completing the  Pre-Surgery Clear Ensure.         If you have questions, please contact your surgeon's office.     Take these medicines the morning of surgery with A SIP OF WATER:   amLODipine (NORVASC)   fluticasone furoate-vilanterol (BREO ELLIPTA)   levothyroxine (SYNTHROID)   metoprolol (LOPRESSOR)   rosuvastatin (CRESTOR)    lbuterol (PROVENTIL HFA;VENTOLIN HFA)- If needed   As of today, STOP taking any Aspirin (unless otherwise instructed by your surgeon) Aleve, Naproxen, Ibuprofen, Motrin, Advil, Goody's, BC's, all herbal medications, fish oil, and all vitamins.           Do not wear jewelry or makeup. Do not wear lotions, powders, perfumes/cologne or  deodorant. Do not shave 48 hours prior to surgery.  Men may shave face and neck. Do not bring valuables to the hospital. Do not wear nail polish, gel polish, artificial nails, or any other type of covering on natural nails (fingers and toes) If you have artificial nails or gel coating that need to be removed by a nail salon, please have this removed prior to surgery. Artificial nails or gel coating may interfere with anesthesia's ability to adequately monitor your vital signs.  [facility] is not responsible for any belongings or valuables.    Do NOT Smoke (Tobacco/Vaping)  24 hours prior to your procedure  If you use a CPAP at night, you may bring your mask for your overnight stay.   Contacts, glasses, hearing aids, dentures or partials may not be worn into surgery, please bring cases for these belongings   For patients admitted to the hospital, discharge time will be determined by your treatment team.   Patients discharged the day of surgery will not be allowed to drive home, and someone needs to stay with them for 24 hours.   SURGICAL WAITING ROOM VISITATION Patients having surgery or a procedure may have no more than 2 support people in the waiting area - these visitors may rotate.   Children under the age of 65 must have an adult with them who is not the patient. If the patient needs to stay at the hospital during part of their recovery, the visitor guidelines for  inpatient rooms apply. Pre-op nurse will coordinate an appropriate time for 1 support person to accompany patient in pre-op.  This support person may not rotate.   Please refer to [email or web address] for the visitor guidelines for Inpatients (after your surgery is over and you are in a regular room).    Special instructions:    Oral Hygiene is also important to reduce your risk of infection.  Remember - BRUSH YOUR TEETH THE MORNING OF SURGERY WITH YOUR REGULAR  TOOTHPASTE   [facility]- Preparing For Surgery  Before surgery, you can play an important role. Because skin is not sterile, your skin needs to be as free of germs as possible. You can reduce the number of germs on your skin by washing with CHG (chlorahexidine gluconate) Soap before surgery.  CHG is an antiseptic cleaner which kills germs and bonds with the skin to continue killing germs even after washing.     Please do not use if you have an allergy to CHG or antibacterial soaps. If your skin becomes reddened/irritated stop using the CHG.  Do not shave (including legs and underarms) for at least 48 hours prior to first CHG shower. It is OK to shave your face.  Please follow these instructions carefully.     Shower the NIGHT BEFORE SURGERY and the MORNING OF SURGERY with CHG Soap.   If you chose to wash your hair, wash your hair first as usual with your normal shampoo. After you shampoo, rinse your hair and body thoroughly to remove the shampoo.  Then [employer] and genitals (private parts) with your normal soap and rinse thoroughly to remove soap.  After that Use CHG Soap as you would any other liquid soap. You can apply CHG directly to the skin and wash gently with a scrungie or a clean washcloth.   Apply the CHG Soap to your body ONLY FROM THE NECK DOWN.  Do not use on open wounds or open sores. Avoid contact with your eyes, ears, mouth and genitals (private parts). Wash Face and genitals (private parts)  with your normal soap.   Wash thoroughly, paying special attention to the area where your surgery will be performed.  Thoroughly rinse your body with warm water from the neck down.  DO NOT shower/wash with your normal soap after using and rinsing off the CHG Soap.  Pat yourself dry with a CLEAN TOWEL.  Wear CLEAN PAJAMAS to bed the night before surgery  Place CLEAN SHEETS on your bed the night before your surgery  DO NOT SLEEP WITH PETS.   Day of Surgery:  Take a shower  with CHG soap. Wear Clean/Comfortable clothing the morning of surgery Do not apply any deodorants/lotions.   Remember to brush your teeth WITH YOUR REGULAR TOOTHPASTE.    If you received a COVID test during your pre-op visit, it is requested that you wear a mask when out in public, stay away from anyone that may not be feeling well, and notify your surgeon if you develop symptoms. If you have been in contact with anyone that has tested positive in the last 10 days, please notify your surgeon.    Please read over the following fact sheets that you were given.

## 2022-02-04 NOTE — Progress Notes (Signed)
Echocardiogram 2D Echocardiogram has been performed.  [name] 02/04/2022, 10:42 AM

## 2022-02-05 ENCOUNTER — Encounter (HOSPITAL_COMMUNITY): Payer: Self-pay

## 2022-02-05 ENCOUNTER — Encounter (HOSPITAL_COMMUNITY)
Admission: RE | Admit: 2022-02-05 | Discharge: 2022-02-05 | Disposition: A | Discharge status: Home / Self Care | Payer: Medicare HMO | Source: Ambulatory Visit | Attending: Surgery | Admitting: Surgery

## 2022-02-05 ENCOUNTER — Other Ambulatory Visit: Payer: Self-pay

## 2022-02-05 VITALS — BP 136/63 | HR 62 | Temp 97.5°F | Resp 17 | Ht 60.0 in | Wt 184.0 lb

## 2022-02-05 DIAGNOSIS — R001 Bradycardia, unspecified: Secondary | ICD-10-CM | POA: Insufficient documentation

## 2022-02-05 DIAGNOSIS — Z01818 Encounter for other preprocedural examination: Secondary | ICD-10-CM | POA: Insufficient documentation

## 2022-02-05 LAB — CBC
HCT: 48.2 % — ABNORMAL HIGH (ref 36.0–46.0)
Hemoglobin: 14.7 g/dL (ref 12.0–15.0)
MCH: 25.7 pg — ABNORMAL LOW (ref 26.0–34.0)
MCHC: 30.5 g/dL (ref 30.0–36.0)
MCV: 84.4 fL (ref 80.0–100.0)
Platelets: 272 10*3/uL (ref 150–400)
RBC: 5.71 MIL/uL — ABNORMAL HIGH (ref 3.87–5.11)
RDW: 14.4 % (ref 11.5–15.5)
WBC: 9.4 10*3/uL (ref 4.0–10.5)
nRBC: 0 % (ref 0.0–0.2)

## 2022-02-05 LAB — BASIC METABOLIC PANEL
Anion gap: 9 (ref 5–15)
BUN: 15 mg/dL (ref 8–23)
CO2: 27 mmol/L (ref 22–32)
Calcium: 9.4 mg/dL (ref 8.9–10.3)
Chloride: 102 mmol/L (ref 98–111)
Creatinine, Ser: 0.96 mg/dL (ref 0.44–1.00)
GFR, Estimated: >60 mL/min (ref >60)
Glucose, Bld: 105 mg/dL — ABNORMAL HIGH (ref 70–99)
Potassium: 3.9 mmol/L (ref 3.5–5.1)
Sodium: 138 mmol/L (ref 135–145)

## 2022-02-05 NOTE — Progress Notes (Signed)
PCP - Prince Solian, MD Cardiologist - denies  PPM/ICD - denies Device Orders - n/a Rep Notified - n/a  Chest x-ray - n/a EKG - 02/05/22 Stress Test - denies ECHO - 02/04/22 Cardiac Cath - denies  Sleep Study - denies CPAP - n/a  Fasting Blood Sugar - n/a  Last dose of GLP1 agonist-  n/a   Blood Thinner Instructions: Aspirin - patient was instructed to call MD and ask about Aspirin, if she must hold it prior surgery. Patient verbalized understanding.  Aspirin Instructions: Patient was instructed: As of today, STOP taking any Aspirin (unless otherwise instructed by your surgeon) Aleve, Naproxen, Ibuprofen, Motrin, Advil, Goody's, BC's, all herbal medications, fish oil, and all vitamins   ERAS Protcol - yes PRE-SURGERY Ensure - yes, until 06:00 o'clock  COVID TEST- n/a   Anesthesia review: no  Patient denies shortness of breath, fever, cough and chest pain at PAT appointment   All instructions explained to the patient, with a verbal understanding of the material. Patient agrees to go over the instructions while at home for a better understanding. Patient also instructed to self quarantine after being tested for COVID-19. The opportunity to ask questions was provided.

## 2022-02-05 NOTE — Procedures (Signed)
Physician Interpretation:     [facility] POLYSOMNOGRAPHY  INTERPRETATION REPORT   STUDY DATE:  02/03/2022     PATIENT NAME:  [name]         DATE OF BIRTH:  23-Sep-1945  PATIENT ID:  [id number]    TYPE OF STUDY:  PSG  READING PHYSICIAN: [name], MD, PhD   SCORING TECHNICIAN: Richard Miu, RPSGT  Referred by: Prince Solian, MD   History and Indication for Testing: 77 year old female with a history of left ear hearing loss, hypertension, reflux disease, asthma, allergies, degenerative joint disease and degenerative disc disease, low back pain, history of chest pain, and obesity, who reports snoring and excessive daytime somnolence. ?Her Epworth sleepiness score is 11 out of 24, fatigue severity score is 44 out of 63. Height: 60 in Weight: 188 lb (BMI 36) Neck Size: 16 in   MEDICATIONS: Proventil, Norvasc, Aspirin, Breo Ellipta, Avalide, Synthroid, Lopressor, Singulair, Vitamin D, Colace, Advair, Vicodin, Senokot  TECHNICAL DESCRIPTION: A registered sleep technologist was in attendance for the duration of the recording.  Data collection, scoring, video monitoring, and reporting were performed in compliance with the AASM Manual for the Scoring of Sleep and Associated Events; (Hypopnea is scored based on the criteria listed in Section VIII D. 1b in the AASM Manual V2.6 using a 4% oxygen desaturation rule or Hypopnea is scored based on the criteria listed in Section VIII D. 1a in the AASM Manual V2.6 using 3% oxygen desaturation and /or arousal rule).   SLEEP CONTINUITY AND SLEEP ARCHITECTURE:  Lights-out was at 20:38: and lights-on at  04:55:, with a total recording time of 8 hours, 17.5 min. Total sleep time ( TST) was 310.5 minutes with a decreased sleep efficiency at 62.4%.    BODY POSITION:  TST was divided  between the following sleep positions: 68.3% supine;  31.7% lateral;  0% prone. Duration of total sleep and percent of total sleep in  their respective position is as follows: supine 212 minutes (68%), non-supine 99 minutes (32%); right 19 minutes (6%), left 79 minutes (25%), and prone 00 minutes (0%).  Total supine REM sleep time was 33 minutes (100% of total REM sleep).  Sleep latency was increased at 58.0 minutes.  REM sleep latency was increased at 129.0 minutes. Of the total sleep time, the percentage of stage N1 sleep was 7.2%, stage N2 sleep was 64%, which is increased, stage N3 sleep was 18.5%, which is normal, and REM sleep was 10.6%, which is reduced. Wake after sleep onset (WASO) time accounted for 129 minutes, with moderate sleep fragmentation noted.   RESPIRATORY MONITORING:   Based on CMS criteria (using a 4% oxygen desaturation rule for scoring hypopneas), there were 27 apneas (26 obstructive; 1 central; 0 mixed), and 40 hypopneas.  Apnea index was 5.2. Hypopnea index was 7.7. The apnea-hypopnea index was 12.9/hour overall (16.7 supine, 0 non-supine; 70.9 REM, 70.9 supine REM).  There were 0 respiratory effort-related arousals (RERAs).  The RERA index was 0 events/h. Total respiratory disturbance index (RDI) was 12.9 events/h. RDI results showed: supine RDI  16.7 /h; non-supine RDI 4.9 /h; REM RDI 70.9 /h, supine REM RDI 70.9 /h.   Based on AASM criteria (using a 3% oxygen desaturation and /or arousal rule for scoring hypopneas), there were 27 apneas (26 obstructive; 1 central; 0 mixed), and 40 hypopneas. Apnea index was 5.2. Hypopnea index was 7.7. The apnea-hypopnea index was 12.9 overall (16.7 supine, 0 non-supine; 70.9 REM, 70.9 supine REM).  There were 0 respiratory effort-related arousals (RERAs).  The RERA index was 0 events/h. Total respiratory disturbance index (RDI) was 12.9 events/h. RDI results showed: supine RDI  16.7 /h; non-supine RDI 4.9 /h; REM RDI 70.9 /h, supine REM RDI 70.9 /h.   OXIMETRY: Oxyhemoglobin Saturation Nadir during sleep was at  79%) from a mean of 93%.  Of the Total sleep time (TST)    hypoxemia (=<88%) was present for  7.4 minutes, or 2.4% of total sleep time.   LIMB MOVEMENTS: There were 0 periodic limb movements of sleep (0.0/hr), of which 0 (0.0/hr) were associated with an arousal.  AROUSAL: There were 62 arousals in total, for an arousal index of 12 arousals/hour.  Of these, 17 were identified as respiratory-related arousals (3 /h), 0 were PLM-related arousals (0 /h), and 55 were non-specific arousals (11 /h).  EEG:  Review of the EEG showed no abnormal electrical discharges and symmetrical bihemispheric findings.    EKG: The EKG revealed normal sinus rhythm (NSR). The average heart rate during sleep was 54 bpm.   AUDIO/VIDEO REVIEW: The audio and video review did not show any abnormal or unusual behaviors, movements, phonations or vocalizations. The patient took no restroom breaks.  Mild to moderate snoring was detected.  POST-STUDY QUESTIONNAIRE: Post study, the patient indicated, that sleep was the same as usual.   IMPRESSION:  1. Obstructive Sleep Apnea (OSA) 2. Dysfunctions associated with sleep stages or arousal from sleep  RECOMMENDATIONS:  1. This study demonstrates overall mild obstructive sleep apnea, much more pronounced during REM sleep with a total AHI of 12.9/h, REM AHI of 70.9/h, supine AHI of 16.7/h, O2 nadir 79% during supine REM sleep. Given the patient's medical history and sleep related complaints, treatment with positive airway pressure is recommended; this can be achieved in the form of autoPAP therapy at home. Alternatively, a full-night CPAP titration study would allow optimization of therapy if needed. Other treatment options may include avoidance of supine sleep position along with weight loss, or the use of an oral appliance in selected patients. Please note, that untreated obstructive sleep apnea may carry additional perioperative morbidity. Patients with significant obstructive sleep apnea should receive perioperative PAP therapy and the  surgeons and particularly the anesthesiologist should be informed of the diagnosis and the severity of the sleep disordered breathing. 2. This study shows sleep fragmentation and abnormal sleep stage percentages; these are nonspecific findings and per se do not signify an intrinsic sleep disorder or a cause for the patient's sleep-related symptoms. Causes include (but are not limited to) the first night effect of the sleep study, circadian rhythm disturbances, medication effect or an underlying mood disorder or medical problem.  3. The patient should be cautioned not to drive, work at heights, or operate dangerous or heavy equipment when tired or sleepy. Review and reiteration of good sleep hygiene measures should be pursued with any patient. 4. The patient will be seen in follow-up by Dr. Rexene Alberts at [facility] for discussion of the test results and further management strategies. The referring provider will be notified of the test results.    I certify that I have reviewed the entire raw data recording prior to the issuance of this report in accordance with the Standards of Accreditation of the American Academy of Sleep Medicine (AASM).  [name], MD, PhD Medical Director, [facility]) [place], ABPN (Neurology and Sleep)              Technical Report:  General Information  Name: [name], [name] BMI: 36.72 Physician: [name], MD  ID: [id number] Height: 60.0 in Technician: Richard Miu, RPSGT  Sex: Female Weight: 188.0 lb Record: [id number]  Age: 74 [[birth year]] Date: 02/03/2022    Medical & Medication History    77 year old right-handed woman with an underlying medical history of left ear hearing loss, hypertension, reflux disease, asthma, allergies, degenerative joint disease and degenerative disc disease, low back pain, history of chest pain, and obesity, who reports snoring and excessive daytime somnolence.  Proventil, Norvasc, Aspirin,  Breo Ellipta, Avalide, Synthroid, Lopressor, Singulair, Vitamin D, Colace, Advair, Vicodin, Senokot   Sleep Disorder      Comments   The patient came into the sleep lab for a PSG. Per pt she is not taking Vicodin. No restroom trips. EKG kept in NSR. Mild to moderate snoring noted. Snoring was louder when supine. All respiratory events scored with a 4% desat. The patient slept supine and lateral. All sleep stages witnessed. Respiratory events noted mostly while in REM in the supine position. The patient did have some periods of WASO.     Lights out: 08:38:48 PM Lights on: 04:55:54 AM   Time Total Supine Side Prone Upright  Recording (TRT) 8h 17.9m4h 40.043mh 37.28m69m 0.28m 61m0.58m  39mep (TST) 5h 10.58m 3h458m.28m 1h 64m58m 0h 051m 0h 0.628m  Late24m N1 N2 N3 REM Onset Per. Slp. Eff.  Actual 0h 0.28m 0h 3.28m63m 57.58m42m 9.28m 074m8.28m 1758m.58m 62.48m   [id number] Wake N1 N2 N3 REM  Total 187.0 22.5 197.5 57.5 33.0  Supine 68.0 8.5 141.0 29.5 33.0  Side 118.5 14.0 56.5 28.0 0.0  Prone 0.0 0.0 0.0 0.0 0.0  Upright 0.5 0.0 0.0 0.0 0.0   Stg % Wake N1 N2 N3 REM  Total 37.6 7.2 63.6 18.5 10.6  Supine 13.7 2.7 45.4 9.5 10.6  Side 23.8 4.5 18.2 9.0 0.0  Prone 0.0 0.0 0.0 0.0 0.0  Upright 0.1 0.0 0.0 0.0 0.0     Apnea Summary Sub Supine Side Prone Upright  Total 27 Total 27 27 0 0 0    REM 26 26 0 0 0    NREM 1 1 0 0 0  Obs 26 REM 26 26 0 0 0    NREM 0 0 0 0 0  Mix 0 REM 0 0 0 0 0    NREM 0 0 0 0 0  Cen 1 REM 0 0 0 0 0    NREM 1 1 0 0 0   Rera Summary Sub Supine Side Prone Upright  Total 0 Total 0 0 0 0 0    REM 0 0 0 0 0    NREM 0 0 0 0 0   Hypopnea Summary Sub Supine Side Prone Upright  Total 40 Total 40 32 8 0 0    REM 13 13 0 0 0    NREM '27 19 8 '$ 0 0   4% Hypopnea Summary Sub Supine Side Prone Upright  Total (4%) 40 Total 40 32 8 0 0    REM 13 13 0 0 0    NREM '27 19 8 '$ 0 0     AHI Total Obs Mix Cen  12.95 Apnea 5.22 5.02 0.00 0.19   Hypopnea 7.73 -- -- --  12.95 Hypopnea (4%)  7.73 -- -- --    Total Supine Side Prone Upright  Position AHI 12.95 16.70 4.87 0.00 0.00  REM AHI  70.91   NREM AHI 6.05   Position RDI 12.95 16.70 4.87 0.00 0.00  REM RDI 70.91   NREM RDI 6.05    4% Hypopnea Total Supine Side Prone Upright  Position AHI (4%) 12.95 16.70 4.87 0.00 0.00  REM AHI (4%) 70.91   NREM AHI (4%) 6.05   Position RDI (4%) 12.95 16.70 4.87 0.00 0.00  REM RDI (4%) 70.91   NREM RDI (4%) 6.05    Desaturation Information Threshold: 2% <100% <90% <80% <70% <60% <50% <40%  Supine 139.0 38.0 2.0 0.0 0.0 0.0 0.0  Side 36.0 1.0 0.0 0.0 0.0 0.0 0.0  Prone 0.0 0.0 0.0 0.0 0.0 0.0 0.0  Upright 0.0 0.0 0.0 0.0 0.0 0.0 0.0  Total 175.0 39.0 2.0 0.0 0.0 0.0 0.0  Index 24.0 5.4 0.3 0.0 0.0 0.0 0.0   Threshold: 3% <100% <90% <80% <70% <60% <50% <40%  Supine 75.0 37.0 2.0 0.0 0.0 0.0 0.0  Side 14.0 1.0 0.0 0.0 0.0 0.0 0.0  Prone 0.0 0.0 0.0 0.0 0.0 0.0 0.0  Upright 0.0 0.0 0.0 0.0 0.0 0.0 0.0  Total 89.0 38.0 2.0 0.0 0.0 0.0 0.0  Index 12.2 5.2 0.3 0.0 0.0 0.0 0.0   Threshold: 4% <100% <90% <80% <70% <60% <50% <40%  Supine 63.0 37.0 2.0 0.0 0.0 0.0 0.0  Side 11.0 1.0 0.0 0.0 0.0 0.0 0.0  Prone 0.0 0.0 0.0 0.0 0.0 0.0 0.0  Upright 0.0 0.0 0.0 0.0 0.0 0.0 0.0  Total 74.0 38.0 2.0 0.0 0.0 0.0 0.0  Index 10.2 5.2 0.3 0.0 0.0 0.0 0.0   Threshold: 3% <100% <90% <80% <70% <60% <50% <40%  Supine 75 37 2 0 0 0 0  Side 14 1 0 0 0 0 0  Prone 0 0 0 0 0 0 0  Upright 0 0 0 0 0 0 0  Total 89 38 2 0 0 0 0   Awakening/Arousal Information # of Awakenings 32  Wake after sleep onset 129.35m Wake after persistent sleep 128.062m Arousal Assoc. Arousals Index  Apneas 7 1.4  Hypopneas 10 1.9  Leg Movements 0 0.0  Snore 0 0.0  PTT Arousals 0 0.0  Spontaneous 55 10.6  Total 72 13.9  Leg Movement Information PLMS LMs Index  Total LMs during PLMS 0 0.0  LMs w/ Microarousals 0 0.0   LM LMs Index  w/ Microarousal 0 0.0  w/ Awakening 0 0.0  w/ Resp Event 0 0.0   Spontaneous 1 0.2  Total 1 0.2     Desaturation threshold setting: 3% Minimum desaturation setting: 10 seconds SaO2 nadir: 76% The longest event was a 60 sec obstructive Hypopnea with a minimum SaO2 of 90%. The lowest SaO2 was 76% associated with a 30 sec obstructive Apnea. EKG Rates EKG Avg Max Min  Awake 59 92 50  Asleep 54 75 48  EKG Events: N/A

## 2022-02-08 NOTE — H&P (Signed)
REFERRING PHYSICIAN: Tivis Ringer, MD  PROVIDER: Beverlee Nims, MD  MRN: [id number] DOB: [birth year]  Subjective  Chief Complaint: New Consultation (Left Breast Cancer and Right Breast Biopsy )   History of Present Illness: [name] is a 77 y.o. female who is seen  as an office consultation for evaluation of New Consultation (Left Breast Cancer and Right Breast Biopsy ) .  This is a 77 year old female who recently palpated a mass in the right breast in early December. She underwent bilateral mammograms which showed 2 separate masses at the 9:30 position of the right breast with a total area measuring 3.8 cm as well as a 5.7 cm area of calcifications in the left breast. 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 One of the cancers was 100% ER positive and 15% PR positive with a Ki-67 of 10%. It was HER2 negative. The second area was 95% ER/PR positive with a Ki-67 of 30%. There were also calcifications underneath the right nipple and nipple inversion which cannot be biopsied. 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 The invasive cancer was 100% ER and PR positive with a Ki-67 of 20%. The DCIS was 95% ER positive and 10% PR positive. She has had no previous problems regarding her breast. She denies nipple discharge. She has no cardiopulmonary issues. She has had no problems with general anesthesia.  Review of Systems: A complete review of systems was obtained from the patient. I have reviewed this information and discussed as appropriate with the patient. See HPI as well for other ROS.  ROS  Medical History: Past Medical History: Diagnosis Date Arthritis Asthma, unspecified asthma severity, unspecified whether complicated, unspecified whether persistent GERD (gastroesophageal reflux disease) Hyperlipidemia Hypertension Thyroid disease  There is no problem list on file for  this patient.  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 CHOLECYSTECTOMY   Allergies Allergen Reactions Codeine Nausea and Rash  Current Outpatient Medications on File Prior to Visit Medication Sig Dispense Refill amLODIPine (NORVASC) 10 MG tablet Take 1 tablet by mouth once daily aspirin 81 MG EC tablet Take by mouth at bedtime EUTHYROX 50 mcg tablet TAKE 1 TABLET BY MOUTH ONCE DAILY IN THE MORNING ON AN EMPTY STOMACH irbesartan-hydroCHLOROthiazide (AVALIDE) 300-12.5 mg tablet Take 1 tablet by mouth once daily metoprolol tartrate (LOPRESSOR) 50 MG tablet Take 1 tablet by mouth 2 (two) times daily montelukast (SINGULAIR) 10 mg tablet Take 10 mg by mouth at bedtime rosuvastatin (CRESTOR) 5 MG tablet Take 1 tablet by mouth once daily  No current facility-administered medications on file prior to visit.  History reviewed. No pertinent family history.  Social History  Tobacco Use Smoking Status Never Smokeless Tobacco Never   Social History  Socioeconomic History Marital status: Married Tobacco Use Smoking status: Never Smokeless tobacco: Never Substance and Sexual Activity Alcohol use: Never Drug use: Never  Objective:  Vitals:  BP: (!) 160/70 Pulse: 62 Temp: 36.4 C (97.5 F) SpO2: 97% Weight: 84.8 kg (187 lb) Height: 161.3 cm (5' 3.5")  Body mass index is 32.61 kg/m.  Physical Exam  She appears well on exam  Her breasts are small bilaterally. In the right breast there is a palpable mass in the upper outer quadrant of the right breast. There is a large lipoma in her right axilla. There is an inverted nipple which again she has had present for years. There is  a small hematoma at the 12 o'clock position of the left breast but no other palpable masses and the nipple areolar complex is normal.  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 We discussed breast cancer in general. I gave them a copy of her pathology results and we went over these in detail. We next discussed from a surgical standpoint breast conservation versus mastectomies. If she chooses to proceed with breast conservation, we would need to get bilateral breast MRI and have her see medical oncology to consider neoadjuvant therapy. We next discussed mastectomies with sentinel node biopsies and immediate reconstruction. After long discussion, she wants to just proceed with mastectomies without reconstruction bilaterally. I discussed the surgical procedure with her in detail. We discussed the risks which includes but is not limited to bleeding, infection, injury to surrounding structures, the need to sample lymph nodes, the need for drains postoperatively, the need for further procedures, cardiopulmonary issues, DVT, etc.  We will go ahead and start working on scheduling her bilateral mastectomies. She will be referred to the cancer center as well to see medical and radiation oncology as well as physical therapy. She and her husband agree with the plans

## 2022-02-08 NOTE — Anesthesia Preprocedure Evaluation (Signed)
Anesthesia Evaluation  Patient identified by MRN, date of birth, ID band Patient awake    Reviewed: Allergy & Precautions, NPO status , Patient's Chart, lab work & pertinent test results, reviewed documented beta blocker date and time   History of Anesthesia Complications Negative for: history of anesthetic complications  Airway Mallampati: I  TM Distance: >3 FB Neck ROM: Full    Dental  (+) Dental Advisory Given, Partial Lower   Pulmonary asthma , COPD,  COPD inhaler   breath sounds clear to auscultation       Cardiovascular hypertension, Pt. on medications and Pt. on home beta blockers (-) angina  Rhythm:Regular Rate:Normal  02/04/2022 ECHO: EF 60-65%, Normal LVF, mild LVH, Grade 1 DD, normal RVF, no significant valvular abnormalities   Neuro/Psych negative neurological ROS     GI/Hepatic Neg liver ROS,GERD  Controlled,,  Endo/Other  BMI 36  Renal/GU S/p nephrectomy     Musculoskeletal   Abdominal   Peds  Hematology   Anesthesia Other Findings   Reproductive/Obstetrics                             Anesthesia Physical Anesthesia Plan  ASA: 3  Anesthesia Plan: General   Post-op Pain Management: Tylenol PO (pre-op)* and Regional block*   Induction: Intravenous  PONV Risk Score and Plan: 3 and Ondansetron, Dexamethasone and Treatment may vary due to age or medical condition  Airway Management Planned: Oral ETT  Additional Equipment: None  Intra-op Plan:   Post-operative Plan: Extubation in OR  Informed Consent: I have reviewed the patients History and Physical, chart, labs and discussed the procedure including the risks, benefits and alternatives for the proposed anesthesia with the patient or authorized representative who has indicated his/her understanding and acceptance.     Dental advisory given  Plan Discussed with: CRNA and Surgeon  Anesthesia Plan Comments: (Plan  routine monitors, GETA with bilat pectoralis blocks for post op analgesia)       Anesthesia Quick Evaluation

## 2022-02-09 ENCOUNTER — Encounter (HOSPITAL_COMMUNITY)
Admission: RE | Disposition: A | Discharge status: Home / Self Care | Payer: Self-pay | Source: Home / Self Care | Attending: Surgery

## 2022-02-09 ENCOUNTER — Other Ambulatory Visit: Payer: Self-pay

## 2022-02-09 ENCOUNTER — Observation Stay (HOSPITAL_COMMUNITY): Payer: Medicare HMO

## 2022-02-09 ENCOUNTER — Observation Stay (HOSPITAL_COMMUNITY): Payer: Medicare HMO | Admitting: Anesthesiology

## 2022-02-09 ENCOUNTER — Observation Stay (HOSPITAL_COMMUNITY)
Admission: RE | Admit: 2022-02-09 | Discharge: 2022-02-10 | Disposition: A | Discharge status: Home / Self Care | Payer: Medicare HMO | Attending: Surgery | Admitting: Surgery

## 2022-02-09 ENCOUNTER — Observation Stay (HOSPITAL_BASED_OUTPATIENT_CLINIC_OR_DEPARTMENT_OTHER): Payer: Medicare HMO | Admitting: Anesthesiology

## 2022-02-09 ENCOUNTER — Encounter (HOSPITAL_COMMUNITY): Payer: Self-pay | Admitting: Surgery

## 2022-02-09 DIAGNOSIS — C50912 Malignant neoplasm of unspecified site of left female breast: Secondary | ICD-10-CM | POA: Diagnosis not present

## 2022-02-09 DIAGNOSIS — J45909 Unspecified asthma, uncomplicated: Secondary | ICD-10-CM | POA: Diagnosis not present

## 2022-02-09 DIAGNOSIS — Z79899 Other long term (current) drug therapy: Secondary | ICD-10-CM | POA: Insufficient documentation

## 2022-02-09 DIAGNOSIS — I10 Essential (primary) hypertension: Secondary | ICD-10-CM

## 2022-02-09 DIAGNOSIS — C773 Secondary and unspecified malignant neoplasm of axilla and upper limb lymph nodes: Secondary | ICD-10-CM | POA: Diagnosis not present

## 2022-02-09 DIAGNOSIS — Z170 Estrogen receptor positive status [ER+]: Secondary | ICD-10-CM | POA: Diagnosis not present

## 2022-02-09 DIAGNOSIS — Z7982 Long term (current) use of aspirin: Secondary | ICD-10-CM | POA: Insufficient documentation

## 2022-02-09 DIAGNOSIS — D241 Benign neoplasm of right breast: Secondary | ICD-10-CM | POA: Diagnosis not present

## 2022-02-09 DIAGNOSIS — C50919 Malignant neoplasm of unspecified site of unspecified female breast: Secondary | ICD-10-CM | POA: Diagnosis present

## 2022-02-09 DIAGNOSIS — C50911 Malignant neoplasm of unspecified site of right female breast: Principal | ICD-10-CM | POA: Diagnosis present

## 2022-02-09 DIAGNOSIS — G8918 Other acute postprocedural pain: Secondary | ICD-10-CM | POA: Diagnosis not present

## 2022-02-09 DIAGNOSIS — R921 Mammographic calcification found on diagnostic imaging of breast: Secondary | ICD-10-CM | POA: Diagnosis not present

## 2022-02-09 DIAGNOSIS — N641 Fat necrosis of breast: Secondary | ICD-10-CM | POA: Diagnosis not present

## 2022-02-09 DIAGNOSIS — J449 Chronic obstructive pulmonary disease, unspecified: Secondary | ICD-10-CM | POA: Diagnosis not present

## 2022-02-09 DIAGNOSIS — N6011 Diffuse cystic mastopathy of right breast: Secondary | ICD-10-CM | POA: Diagnosis not present

## 2022-02-09 DIAGNOSIS — N6081 Other benign mammary dysplasias of right breast: Secondary | ICD-10-CM | POA: Diagnosis not present

## 2022-02-09 DIAGNOSIS — Z9013 Acquired absence of bilateral breasts and nipples: Secondary | ICD-10-CM

## 2022-02-09 HISTORY — PX: PORTACATH PLACEMENT: SHX2246

## 2022-02-09 HISTORY — PX: TOTAL MASTECTOMY: SHX6129

## 2022-02-09 HISTORY — PX: SENTINEL NODE BIOPSY: SHX6608

## 2022-02-09 SURGERY — MASTECTOMY, SIMPLE
Anesthesia: General | Site: Chest | Laterality: Left

## 2022-02-09 MED ORDER — ALBUTEROL SULFATE (2.5 MG/3ML) 0.083% IN NEBU: 2.5000 mg | INHALATION_SOLUTION | Freq: Four times a day (QID) | RESPIRATORY_TRACT | Status: DC | PRN

## 2022-02-09 MED ORDER — FENTANYL CITRATE (PF) 100 MCG/2ML IJ SOLN: 100.0000 ug | Freq: Once | INTRAMUSCULAR | Status: AC

## 2022-02-09 MED ORDER — DIPHENHYDRAMINE HCL 50 MG/ML IJ SOLN: 12.5000 mg | Freq: Four times a day (QID) | INTRAMUSCULAR | Status: DC | PRN

## 2022-02-09 MED ORDER — HYDROMORPHONE HCL 1 MG/ML IJ SOLN: 0.2500 mg | INTRAMUSCULAR | Status: DC | PRN

## 2022-02-09 MED ORDER — ENSURE PRE-SURGERY PO LIQD: 296.0000 mL | Freq: Once | ORAL | Status: DC

## 2022-02-09 MED ORDER — HEPARIN 6000 UNIT IRRIGATION SOLUTION
Status: AC
  Filled 2022-02-09: qty 500

## 2022-02-09 MED ORDER — SUGAMMADEX SODIUM 200 MG/2ML IV SOLN
INTRAVENOUS | Status: DC | PRN
  Administered 2022-02-09: 200 mg via INTRAVENOUS

## 2022-02-09 MED ORDER — HEPARIN SOD (PORK) LOCK FLUSH 100 UNIT/ML IV SOLN
INTRAVENOUS | Status: DC | PRN
  Administered 2022-02-09: 500 [IU] via INTRAVENOUS

## 2022-02-09 MED ORDER — METOPROLOL TARTRATE 50 MG PO TABS
50.0000 mg | ORAL_TABLET | Freq: Two times a day (BID) | ORAL | Status: DC
  Administered 2022-02-09 – 2022-02-10 (×2): 50 mg via ORAL
  Filled 2022-02-09 (×2): qty 1

## 2022-02-09 MED ORDER — AMLODIPINE BESYLATE 10 MG PO TABS
10.0000 mg | ORAL_TABLET | Freq: Every day | ORAL | Status: DC
  Administered 2022-02-10: 10 mg via ORAL
  Filled 2022-02-09: qty 1

## 2022-02-09 MED ORDER — SODIUM CHLORIDE 0.9 % IV SOLN: INTRAVENOUS | Status: DC

## 2022-02-09 MED ORDER — FLUTICASONE FUROATE-VILANTEROL 100-25 MCG/ACT IN AEPB
1.0000 | INHALATION_SPRAY | Freq: Every day | RESPIRATORY_TRACT | Status: DC
  Filled 2022-02-09: qty 28

## 2022-02-09 MED ORDER — ACETAMINOPHEN 500 MG PO TABS: 1000.0000 mg | ORAL_TABLET | Freq: Once | ORAL | Status: DC

## 2022-02-09 MED ORDER — ONDANSETRON 4 MG PO TBDP: 4.0000 mg | ORAL_TABLET | Freq: Four times a day (QID) | ORAL | Status: DC | PRN

## 2022-02-09 MED ORDER — LIDOCAINE 2% (20 MG/ML) 5 ML SYRINGE
INTRAMUSCULAR | Status: DC | PRN
  Administered 2022-02-09: 40 mg via INTRAVENOUS

## 2022-02-09 MED ORDER — MONTELUKAST SODIUM 10 MG PO TABS
10.0000 mg | ORAL_TABLET | Freq: Every day | ORAL | Status: DC
  Administered 2022-02-09: 10 mg via ORAL
  Filled 2022-02-09: qty 1

## 2022-02-09 MED ORDER — ORAL CARE MOUTH RINSE: 15.0000 mL | Freq: Once | OROMUCOSAL | Status: DC

## 2022-02-09 MED ORDER — OXYCODONE HCL 5 MG PO TABS: 5.0000 mg | ORAL_TABLET | ORAL | Status: DC | PRN

## 2022-02-09 MED ORDER — DIPHENHYDRAMINE HCL 12.5 MG/5ML PO ELIX: 12.5000 mg | ORAL_SOLUTION | Freq: Four times a day (QID) | ORAL | Status: DC | PRN

## 2022-02-09 MED ORDER — ROSUVASTATIN CALCIUM 5 MG PO TABS
5.0000 mg | ORAL_TABLET | Freq: Every day | ORAL | Status: DC
  Administered 2022-02-09 – 2022-02-10 (×2): 5 mg via ORAL
  Filled 2022-02-09 (×2): qty 1

## 2022-02-09 MED ORDER — FENTANYL CITRATE (PF) 100 MCG/2ML IJ SOLN
INTRAMUSCULAR | Status: AC
  Administered 2022-02-09: 100 ug via INTRAVENOUS
  Filled 2022-02-09: qty 2

## 2022-02-09 MED ORDER — IRBESARTAN 300 MG PO TABS
300.0000 mg | ORAL_TABLET | Freq: Every day | ORAL | Status: DC
  Administered 2022-02-09 – 2022-02-10 (×2): 300 mg via ORAL
  Filled 2022-02-09 (×2): qty 1

## 2022-02-09 MED ORDER — PROPOFOL 10 MG/ML IV BOLUS
INTRAVENOUS | Status: DC | PRN
  Administered 2022-02-09: 130 mg via INTRAVENOUS

## 2022-02-09 MED ORDER — ACETAMINOPHEN 500 MG PO TABS
1000.0000 mg | ORAL_TABLET | Freq: Four times a day (QID) | ORAL | Status: DC
  Administered 2022-02-09 – 2022-02-10 (×3): 1000 mg via ORAL
  Filled 2022-02-09 (×3): qty 2

## 2022-02-09 MED ORDER — CEFAZOLIN SODIUM-DEXTROSE 2-4 GM/100ML-% IV SOLN
2.0000 g | INTRAVENOUS | Status: AC
  Administered 2022-02-09: 2 g via INTRAVENOUS

## 2022-02-09 MED ORDER — MAGTRACE LYMPHATIC TRACER
INTRAMUSCULAR | Status: DC | PRN
  Administered 2022-02-09 (×2): 2 mL via INTRAMUSCULAR

## 2022-02-09 MED ORDER — IRBESARTAN-HYDROCHLOROTHIAZIDE 300-12.5 MG PO TABS: 1.0000 | ORAL_TABLET | Freq: Every day | ORAL | Status: DC

## 2022-02-09 MED ORDER — ENOXAPARIN SODIUM 40 MG/0.4ML IJ SOSY
40.0000 mg | PREFILLED_SYRINGE | INTRAMUSCULAR | Status: DC
  Administered 2022-02-10: 40 mg via SUBCUTANEOUS
  Filled 2022-02-09: qty 0.4

## 2022-02-09 MED ORDER — BUPIVACAINE-EPINEPHRINE (PF) 0.25% -1:200000 IJ SOLN
INTRAMUSCULAR | Status: DC | PRN
  Administered 2022-02-09: 30 mL
  Administered 2022-02-09: 25 mL

## 2022-02-09 MED ORDER — ONDANSETRON HCL 4 MG/2ML IJ SOLN
INTRAMUSCULAR | Status: DC | PRN
  Administered 2022-02-09: 4 mg via INTRAVENOUS

## 2022-02-09 MED ORDER — FENTANYL CITRATE (PF) 250 MCG/5ML IJ SOLN
INTRAMUSCULAR | Status: AC
  Filled 2022-02-09: qty 5

## 2022-02-09 MED ORDER — OXYCODONE HCL 5 MG/5ML PO SOLN: 5.0000 mg | Freq: Once | ORAL | Status: DC | PRN

## 2022-02-09 MED ORDER — PHENYLEPHRINE HCL-NACL 20-0.9 MG/250ML-% IV SOLN
INTRAVENOUS | Status: DC | PRN
  Administered 2022-02-09: 15 ug/min via INTRAVENOUS

## 2022-02-09 MED ORDER — CHLORHEXIDINE GLUCONATE 0.12 % MT SOLN: 15.0000 mL | Freq: Once | OROMUCOSAL | Status: DC

## 2022-02-09 MED ORDER — HYDROMORPHONE HCL 1 MG/ML IJ SOLN: 1.0000 mg | INTRAMUSCULAR | Status: DC | PRN

## 2022-02-09 MED ORDER — OXYCODONE HCL 5 MG PO TABS: 5.0000 mg | ORAL_TABLET | Freq: Once | ORAL | Status: DC | PRN

## 2022-02-09 MED ORDER — EPHEDRINE SULFATE-NACL 50-0.9 MG/10ML-% IV SOSY
PREFILLED_SYRINGE | INTRAVENOUS | Status: DC | PRN
  Administered 2022-02-09: 5 mg via INTRAVENOUS

## 2022-02-09 MED ORDER — BUPIVACAINE HCL (PF) 0.25 % IJ SOLN
INTRAMUSCULAR | Status: AC
  Filled 2022-02-09: qty 30

## 2022-02-09 MED ORDER — LEVOTHYROXINE SODIUM 50 MCG PO TABS
50.0000 ug | ORAL_TABLET | Freq: Every day | ORAL | Status: DC
  Administered 2022-02-10: 50 ug via ORAL
  Filled 2022-02-09: qty 1

## 2022-02-09 MED ORDER — DEXAMETHASONE SODIUM PHOSPHATE 10 MG/ML IJ SOLN
INTRAMUSCULAR | Status: DC | PRN
  Administered 2022-02-09: 10 mg via INTRAVENOUS

## 2022-02-09 MED ORDER — FENTANYL CITRATE (PF) 250 MCG/5ML IJ SOLN
INTRAMUSCULAR | Status: DC | PRN
  Administered 2022-02-09: 50 ug via INTRAVENOUS
  Administered 2022-02-09: 100 ug via INTRAVENOUS
  Administered 2022-02-09: 50 ug via INTRAVENOUS

## 2022-02-09 MED ORDER — PROMETHAZINE HCL 25 MG/ML IJ SOLN: 6.2500 mg | INTRAMUSCULAR | Status: DC | PRN

## 2022-02-09 MED ORDER — HYDROCHLOROTHIAZIDE 12.5 MG PO TABS
12.5000 mg | ORAL_TABLET | Freq: Every day | ORAL | Status: DC
  Administered 2022-02-09 – 2022-02-10 (×2): 12.5 mg via ORAL
  Filled 2022-02-09 (×2): qty 1

## 2022-02-09 MED ORDER — ONDANSETRON HCL 4 MG/2ML IJ SOLN: 4.0000 mg | Freq: Four times a day (QID) | INTRAMUSCULAR | Status: DC | PRN

## 2022-02-09 MED ORDER — METHOCARBAMOL 500 MG PO TABS: 500.0000 mg | ORAL_TABLET | Freq: Four times a day (QID) | ORAL | Status: DC | PRN

## 2022-02-09 MED ORDER — CHLORHEXIDINE GLUCONATE CLOTH 2 % EX PADS: 6.0000 | MEDICATED_PAD | Freq: Once | CUTANEOUS | Status: DC

## 2022-02-09 MED ORDER — CHLORHEXIDINE GLUCONATE 0.12 % MT SOLN
OROMUCOSAL | Status: AC
  Administered 2022-02-09: 15 mL
  Filled 2022-02-09: qty 15

## 2022-02-09 MED ORDER — HEPARIN SOD (PORK) LOCK FLUSH 100 UNIT/ML IV SOLN
INTRAVENOUS | Status: AC
  Filled 2022-02-09: qty 5

## 2022-02-09 MED ORDER — 0.9 % SODIUM CHLORIDE (POUR BTL) OPTIME
TOPICAL | Status: DC | PRN
  Administered 2022-02-09 (×2): 1000 mL

## 2022-02-09 MED ORDER — MIDAZOLAM HCL 2 MG/2ML IJ SOLN: 0.5000 mg | Freq: Once | INTRAMUSCULAR | Status: DC | PRN

## 2022-02-09 MED ORDER — HEPARIN 6000 UNIT IRRIGATION SOLUTION
Status: DC | PRN
  Administered 2022-02-09: 1

## 2022-02-09 MED ORDER — ACETAMINOPHEN 500 MG PO TABS
ORAL_TABLET | ORAL | Status: AC
  Administered 2022-02-09: 1000 mg
  Filled 2022-02-09: qty 2

## 2022-02-09 MED ORDER — TRAMADOL HCL 50 MG PO TABS: 50.0000 mg | ORAL_TABLET | Freq: Four times a day (QID) | ORAL | Status: DC | PRN

## 2022-02-09 MED ORDER — ACETAMINOPHEN 500 MG PO TABS: 1000.0000 mg | ORAL_TABLET | ORAL | Status: DC

## 2022-02-09 MED ORDER — CEFAZOLIN SODIUM-DEXTROSE 2-4 GM/100ML-% IV SOLN
INTRAVENOUS | Status: AC
  Filled 2022-02-09: qty 100

## 2022-02-09 MED ORDER — ROCURONIUM BROMIDE 10 MG/ML (PF) SYRINGE
PREFILLED_SYRINGE | INTRAVENOUS | Status: DC | PRN
  Administered 2022-02-09: 60 mg via INTRAVENOUS

## 2022-02-09 MED ORDER — LACTATED RINGERS IV SOLN: INTRAVENOUS | Status: DC

## 2022-02-09 MED ORDER — PROPOFOL 10 MG/ML IV BOLUS
INTRAVENOUS | Status: AC
  Filled 2022-02-09: qty 20

## 2022-02-09 SURGICAL SUPPLY — 81 items
ADH SKN CLS APL DERMABOND .7 (GAUZE/BANDAGES/DRESSINGS) ×6
APL PRP STRL LF DISP 70% ISPRP (MISCELLANEOUS) ×2
APL PRP STRL LF ISPRP CHG 10.5 (MISCELLANEOUS) ×2
APPLICATOR CHLORAPREP 10.5 ORG (MISCELLANEOUS) ×2 IMPLANT
APPLIER CLIP 9.375 MED OPEN (MISCELLANEOUS) ×2
APR CLP MED 9.3 20 MLT OPN (MISCELLANEOUS) ×2
BAG COUNTER SPONGE SURGICOUNT (BAG) ×2 IMPLANT
BAG DECANTER FOR FLEXI CONT (MISCELLANEOUS) ×2 IMPLANT
BAG SPNG CNTER NS LX DISP (BAG) ×2
BINDER BREAST LRG (GAUZE/BANDAGES/DRESSINGS) IMPLANT
BINDER BREAST XLRG (GAUZE/BANDAGES/DRESSINGS) IMPLANT
BIOPATCH RED 1 DISK 7.0 (GAUZE/BANDAGES/DRESSINGS) IMPLANT
CANISTER SUCT 3000ML PPV (MISCELLANEOUS) ×2 IMPLANT
CHLORAPREP W/TINT 26 (MISCELLANEOUS) ×2 IMPLANT
CLIP APPLIE 9.375 MED OPEN (MISCELLANEOUS) IMPLANT
COVER PROBE W GEL 5X96 (DRAPES) IMPLANT
COVER SURGICAL LIGHT HANDLE (MISCELLANEOUS) ×2 IMPLANT
COVER TRANSDUCER ULTRASND GEL (DISPOSABLE) IMPLANT
DERMABOND ADVANCED .7 DNX12 (GAUZE/BANDAGES/DRESSINGS) ×2 IMPLANT
DRAIN CHANNEL 19F RND (DRAIN) ×2 IMPLANT
DRAPE C-ARM 42X120 X-RAY (DRAPES) ×2 IMPLANT
DRAPE CHEST BREAST 15X10 FENES (DRAPES) ×2 IMPLANT
DRAPE LAPAROSCOPIC ABDOMINAL (DRAPES) ×2 IMPLANT
DRAPE LAPAROTOMY 100X72 PEDS (DRAPES) ×2 IMPLANT
DRAPE UTILITY XL STRL (DRAPES) IMPLANT
DRSG TEGADERM 4X4.75 (GAUZE/BANDAGES/DRESSINGS) ×2 IMPLANT
ELECT CAUTERY BLADE 6.4 (BLADE) ×2 IMPLANT
ELECT REM PT RETURN 9FT ADLT (ELECTROSURGICAL) ×2
ELECTRODE REM PT RTRN 9FT ADLT (ELECTROSURGICAL) ×2 IMPLANT
EVACUATOR SILICONE 100CC (DRAIN) ×2 IMPLANT
GAUZE 4X4 16PLY ~~LOC~~+RFID DBL (SPONGE) ×2 IMPLANT
GAUZE SPONGE 2X2 8PLY STRL LF (GAUZE/BANDAGES/DRESSINGS) ×2 IMPLANT
GAUZE SPONGE 4X4 12PLY STRL (GAUZE/BANDAGES/DRESSINGS) IMPLANT
GAUZE SPONGE 4X4 12PLY STRL LF (GAUZE/BANDAGES/DRESSINGS) ×2 IMPLANT
GEL ULTRASOUND 20GR AQUASONIC (MISCELLANEOUS) IMPLANT
GLOVE BIO SURGEON STRL SZ 6.5 (GLOVE) IMPLANT
GLOVE BIO SURGEON STRL SZ7 (GLOVE) IMPLANT
GLOVE BIOGEL PI IND STRL 6.5 (GLOVE) IMPLANT
GLOVE BIOGEL PI IND STRL 7.0 (GLOVE) IMPLANT
GLOVE SURG SIGNA 7.5 PF LTX (GLOVE) ×2 IMPLANT
GOWN STRL REUS W/ TWL LRG LVL3 (GOWN DISPOSABLE) ×4 IMPLANT
GOWN STRL REUS W/ TWL XL LVL3 (GOWN DISPOSABLE) ×2 IMPLANT
GOWN STRL REUS W/TWL LRG LVL3 (GOWN DISPOSABLE) ×8
GOWN STRL REUS W/TWL XL LVL3 (GOWN DISPOSABLE) ×4
INTRODUCER COOK 11FR (CATHETERS) IMPLANT
KIT BASIN OR (CUSTOM PROCEDURE TRAY) ×2 IMPLANT
KIT PORT POWER 8FR ISP CVUE (Port) IMPLANT
KIT TURNOVER KIT B (KITS) ×2 IMPLANT
NDL FILTER BLUNT 18X1 1/2 (NEEDLE) IMPLANT
NDL HYPO 25GX1X1/2 BEV (NEEDLE) IMPLANT
NEEDLE FILTER BLUNT 18X1 1/2 (NEEDLE) ×4 IMPLANT
NEEDLE HYPO 25GX1X1/2 BEV (NEEDLE) ×2 IMPLANT
NS IRRIG 1000ML POUR BTL (IV SOLUTION) ×2 IMPLANT
PACK GENERAL/GYN (CUSTOM PROCEDURE TRAY) ×2 IMPLANT
PAD ARMBOARD 7.5X6 YLW CONV (MISCELLANEOUS) ×2 IMPLANT
PENCIL BUTTON HOLSTER BLD 10FT (ELECTRODE) ×2 IMPLANT
PENCIL SMOKE EVACUATOR (MISCELLANEOUS) ×2 IMPLANT
POSITIONER HEAD DONUT 9IN (MISCELLANEOUS) ×2 IMPLANT
SET INTRODUCER 12FR PACEMAKER (INTRODUCER) IMPLANT
SET SHEATH INTRODUCER 10FR (MISCELLANEOUS) IMPLANT
SHEATH COOK PEEL AWAY SET 9F (SHEATH) IMPLANT
SPECIMEN JAR MEDIUM (MISCELLANEOUS) ×2 IMPLANT
SPECIMEN JAR X LARGE (MISCELLANEOUS) ×2 IMPLANT
SPONGE T-LAP 18X18 ~~LOC~~+RFID (SPONGE) IMPLANT
STAPLER VISISTAT 35W (STAPLE) ×2 IMPLANT
SUT ETHILON 3 0 FSL (SUTURE) ×2 IMPLANT
SUT MNCRL AB 4-0 PS2 18 (SUTURE) ×2 IMPLANT
SUT PROLENE 2 0 SH 30 (SUTURE) ×2 IMPLANT
SUT SILK 2 0 (SUTURE)
SUT SILK 2 0 SH (SUTURE) IMPLANT
SUT SILK 2-0 18XBRD TIE 12 (SUTURE) IMPLANT
SUT VIC AB 3-0 SH 18 (SUTURE) ×2 IMPLANT
SUT VIC AB 3-0 SH 27 (SUTURE)
SUT VIC AB 3-0 SH 27XBRD (SUTURE) ×2 IMPLANT
SYR 5ML LUER SLIP (SYRINGE) ×2 IMPLANT
SYR BULB IRRIG 60ML STRL (SYRINGE) IMPLANT
TOWEL GREEN STERILE (TOWEL DISPOSABLE) ×2 IMPLANT
TOWEL GREEN STERILE FF (TOWEL DISPOSABLE) ×2 IMPLANT
TRAY LAPAROSCOPIC MC (CUSTOM PROCEDURE TRAY) ×2 IMPLANT
TUBE CONNECTING 12X1/4 (SUCTIONS) IMPLANT
YANKAUER SUCT BULB TIP NO VENT (SUCTIONS) IMPLANT

## 2022-02-09 NOTE — Anesthesia Procedure Notes (Signed)
Anesthesia Regional Block: Pectoralis block   Pre-Anesthetic Checklist: , timeout performed,  Correct Patient, Correct Site, Correct Laterality,  Correct Procedure, Correct Position, site marked,  Risks and benefits discussed,  Surgical consent,  Pre-op evaluation,  At surgeon's request and post-op pain management  Laterality: Left  Prep: chloraprep       Needles:  Injection technique: Single-shot  Needle Type: Echogenic Needle     Needle Length: 9cm  Needle Gauge: 21     Additional Needles:   Procedures:,,,, ultrasound used (permanent image in chart),,    Narrative:  Start time: 02/09/2022 8:21 AM End time: 02/09/2022 8:27 AM Injection made incrementally with aspirations every 5 mL.  Performed by: Personally  Anesthesiologist: Annye Asa, MD  Additional Notes: Pt identified in Holding room.  Monitors applied. Working IV access confirmed. Sterile prep L clavicle and pec.  #21ga ECHOgenic Arrow block needle between ribs 4,5, between pec major and serratus with US guidance.  25cc 0.25% Bupivacaine 1:200k epi injected incrementally after negative test dose.  Patient asymptomatic, VSS, no heme aspirated, tolerated well.   Jenita Seashore, MD

## 2022-02-09 NOTE — Anesthesia Procedure Notes (Signed)
Anesthesia Regional Block: Pectoralis block   Pre-Anesthetic Checklist: , timeout performed,  Correct Patient, Correct Site, Correct Laterality,  Correct Procedure, Correct Position, site marked,  Risks and benefits discussed,  Surgical consent,  Pre-op evaluation,  At surgeon's request and post-op pain management  Laterality: Right  Prep: chloraprep       Needles:  Injection technique: Single-shot  Needle Type: Echogenic Needle     Needle Length: 9cm  Needle Gauge: 21     Additional Needles:   Procedures:,,,, ultrasound used (permanent image in chart),,    Narrative:  Start time: 02/09/2022 8:28 AM End time: 02/09/2022 8:30 AM Injection made incrementally with aspirations every 5 mL.  Performed by: Personally  Anesthesiologist: Annye Asa, MD  Additional Notes: Pt identified in Holding room.  Monitors applied. Working IV access confirmed. Sterile prep R clavicle and pec.  #21ga ECHOgenic Arrow block needle between ribs 4,5, between pec major and serratus with US guidance.  30cc 0.25% Bupivacaine 1:200k epi injected incrementally after negative test dose.  Patient asymptomatic, VSS, no heme aspirated, tolerated well.   Jenita Seashore, MD

## 2022-02-09 NOTE — Interval H&P Note (Signed)
History and Physical Interval Note:no change in H and P  02/09/2022 8:42 AM  [name]  has presented today for surgery, with the diagnosis of BILATERAL BREAST CANCER.  The various methods of treatment have been discussed with the patient and family. After consideration of risks, benefits and other options for treatment, the patient has consented to  Procedure(s): BILATERAL TOTAL MASTECTOMY (Bilateral) BILATERAL SENTINEL NODE BIOPSY (Bilateral) INSERTION PORT-A-CATH (N/A) as a surgical intervention.  The patient's history has been reviewed, patient examined, no change in status, stable for surgery.  I have reviewed the patient's chart and labs.  Questions were answered to the patient's satisfaction.     Coralie Keens

## 2022-02-09 NOTE — Anesthesia Procedure Notes (Cosign Needed Addendum)
Procedure Name: Intubation Date/Time: 02/09/2022 9:10 AM  Performed by: Annye Asa, MDPre-anesthesia Checklist: Patient identified, Emergency Drugs available, Suction available and Patient being monitored Patient Re-evaluated:Patient Re-evaluated prior to induction Oxygen Delivery Method: Circle system utilized Preoxygenation: Pre-oxygenation with 100% oxygen Induction Type: IV induction Ventilation: Mask ventilation without difficulty, Oral airway inserted - appropriate to patient size and Two handed mask ventilation required Laryngoscope Size: Mac and 3 Grade View: Grade I Tube type: Oral Tube size: 7.0 mm Number of attempts: 1 Airway Equipment and Method: Stylet and Oral airway Placement Confirmation: ETT inserted through vocal cords under direct vision, positive ETCO2 and breath sounds checked- equal and bilateral Secured at: 21 cm Tube secured with: Tape Dental Injury: Teeth and Oropharynx as per pre-operative assessment  Comments: Performed by Ellsworth Lennox student NP

## 2022-02-09 NOTE — Op Note (Signed)
[name] 02/09/2022   Pre-op Diagnosis: BILATERAL BREAST CANCER     Post-op Diagnosis: same  Procedure(s): BILATERAL TOTAL MASTECTOMY BILATERAL SENTINEL NODE BIOPSY INSERTION PORT-A-CATH LEFT SUBCLAVIAN VEIN INJECTION OF MAG TRACE BILATERALLY FOR LYMPH NODE MAPPING  Surgeon(s): Coralie Keens, MD Economopoulos, Konstantinos MD  [facility] resident  Anesthesia: General  Staff:  Circulator: Zannie Kehr, RN; Darylene Price, RN Radiology Technologist: March Rummage, RT Relief Scrub: Zannie Kehr, RN Scrub Person: Lindwood Coke, RN; Rolan Bucco  Estimated Blood Loss: 150 cc  Specimens: sent to path  Indications: This is a 77 year old female who presents with bilateral breast cancers.  1 breast shows lobular invasive breast cancer in the other breast shows invasive ductal carcinoma.  After meeting with oncology, she has decided to proceed with bilateral mastectomies without immediate reconstruction, bilateral sentinel node biopsy, and Port-A-Cath insertion for IV chemotherapy  Procedure: The patient was brought to the operating identified as a correct patient.  She was placed upon the operating table and general anesthesia was induced.  Her left chest and neck were then prepped and draped in usual sterile fashion.  With the patient in the Trendelenburg position I easily cannulated the left subclavian vein.  I passed a wire through the distal needle and into the central venous system under direct fluoroscopy.  I then removed the needle.  I made the wire site.  I then dissected down into the subcutaneous tissue with the cautery.  I next created a pocket for the port.  An 8 French [place] port was brought to the field and flushed appropriately.  The port fit easily into the pocket.  I passed the venous dilator introducer sheath over the wire and into the central venous system.  I then removed the wire and dilator.  The catheter was attached to the port and  cut in appropriate length and secured in place.  4 was placed into the pocket and I fed the catheter down the peel-away sheath.  The sheath was then peeled away leaving the catheter in the superior vena cava.  This was confirmed under fluoroscopy.  I accessed the port good flush and return were demonstrated.  I then injected the port with several cc of concentrated heparin solution.  The port was sewn in place with a 3-0 Prolene suture.  We then closed subcutaneous tissue with interrupted 3-0 Vicryl sutures and closed skin with a 4-0 Monocryl suture.  Dermabond then applied.  The patient's arms were then untucked and placed on arm boards.  We injected MAC trace underneath each nipple areolar complex and massaged the breast.  Her breast and axilla were then prepped and draped in usual sterile fashion. We performed an elliptical incision moving medial to lateral around the right nipple areolar complex and working toward the axilla.  We then dissected down circumferentially with electrocautery.  We then created superior skin flaps going superiorly toward the upper chest and then down to the chest wall with the cautery.  We then performed the inferior skin flap going down to the inframammary ridge with cautery as well.  We then took the dissection medially from the sternum to the axilla.  We then slowly dissected the breast tissue off of the pectoralis muscle with electrocautery dissecting medial to lateral.  Once we reached the axilla we completed the total mastectomy removing the breast.  We marked the margin laterally with a suture.  The breast was then sent to pathology.  Using the [facility]  trace probe we then identified increased area of uptake in the axilla.  We grasped this area with an Allis clamp and excised the lymph node with the surrounding fatty tissue.  This was sent to pathology as the right axillary lymph nodes.  There were no enlarged palpable nodes in the axilla and no further uptake of the MAC  trace. Next we performed a matching elliptical incision around the nipple areolar complex moving medial to lateral on the left chest and breast.  Again we dissected down to the tissue circumferentially with the cautery.  We then performed the superior and inferior skin flaps going to the upper chest and down to the inframammary ridge with the cautery again dissecting medial to lateral.  We again dissected the breast tissue off the pectoralis muscle dissecting medial laterally with electrocautery as well.  Once the total mastectomy was complete we marked the lateral margin with a suture and it was into pathology as well.  Using the mag trace probe I then identified increased uptake in the deep axilla.  I grasped serine Allis clamp we could identify multiple visible lymph nodes in the surrounding fatty tissue which we excised altogether with the cautery.  This again was sent as the left axillary lymph node package to pathology for evaluation.  No further uptake was identified with the mag trace probe.  We irrigated both chest walls with saline.  Hemostasis appeared to be achieved.  We made 2 separate skin incisions and placed a 19 [place] Blake drain into each mastectomy site.  These were sewn in place with nylon sutures.  We then closed the subcutaneous tissue of both incisions with interrupted 3-0 Vicryl sutures and closed the skin with a running 4-0 Monocryl.  Dermabond was then applied.  Gauze was placed over this and the patient was placed in a breast binder.  She tolerated the procedure well.  All the counts were correct at the end of the procedure.  The patient was then extubated in the operating room and taken in a stable condition to the recovery room.          Coralie Keens   Date: 02/09/2022  Time: 11:18 AM

## 2022-02-09 NOTE — Transfer of Care (Signed)
Immediate Anesthesia Transfer of Care Note  Patient: [name]  Procedure(s) Performed: BILATERAL TOTAL MASTECTOMY (Bilateral: Breast) BILATERAL SENTINEL NODE BIOPSY (Bilateral: Breast) INSERTION PORT-A-CATH (Left: Chest)  Patient Location: PACU  Anesthesia Type:General and Regional  Level of Consciousness: drowsy and patient cooperative  Airway & Oxygen Therapy: Patient Spontanous Breathing and Patient connected to nasal cannula oxygen  Post-op Assessment: Report given to RN, Post -op Vital signs reviewed and stable, and Patient moving all extremities X 4  Post vital signs: Reviewed and stable  Last Vitals:  Vitals Value Taken Time  BP 180/65 02/09/22 1131  Temp    Pulse 61 02/09/22 1134  Resp 18 02/09/22 1134  SpO2 97 % 02/09/22 1134  Vitals shown include unvalidated device data.  Last Pain:  Vitals:   02/09/22 0845  TempSrc:   PainSc: 0-No pain         Complications: No notable events documented.

## 2022-02-09 NOTE — Anesthesia Postprocedure Evaluation (Signed)
Anesthesia Post Note  Patient: [name]  Procedure(s) Performed: BILATERAL TOTAL MASTECTOMY (Bilateral: Breast) BILATERAL SENTINEL NODE BIOPSY (Bilateral: Breast) INSERTION PORT-A-CATH (Left: Chest)     Patient location during evaluation: PACU Anesthesia Type: General Level of consciousness: awake and alert, patient cooperative and oriented Pain management: pain level controlled Vital Signs Assessment: post-procedure vital signs reviewed and stable Respiratory status: spontaneous breathing, nonlabored ventilation and respiratory function stable Cardiovascular status: blood pressure returned to baseline and stable Postop Assessment: no apparent nausea or vomiting Anesthetic complications: no   No notable events documented.  Last Vitals:  Vitals:   02/09/22 1215 02/09/22 1230  BP: (!) 180/66 (!) 181/68  Pulse: (!) 50 (!) 48  Resp: 12 16  Temp:    SpO2: 99% 99%    Last Pain:  Vitals:   02/09/22 1230  TempSrc:   PainSc: 0-No pain                 Shanekia Latella,E. Shalee Paolo

## 2022-02-10 ENCOUNTER — Encounter (HOSPITAL_COMMUNITY): Payer: Self-pay | Admitting: Surgery

## 2022-02-10 DIAGNOSIS — C50911 Malignant neoplasm of unspecified site of right female breast: Secondary | ICD-10-CM | POA: Diagnosis not present

## 2022-02-10 DIAGNOSIS — Z79899 Other long term (current) drug therapy: Secondary | ICD-10-CM | POA: Diagnosis not present

## 2022-02-10 DIAGNOSIS — Z7982 Long term (current) use of aspirin: Secondary | ICD-10-CM | POA: Diagnosis not present

## 2022-02-10 DIAGNOSIS — J45909 Unspecified asthma, uncomplicated: Secondary | ICD-10-CM | POA: Diagnosis not present

## 2022-02-10 DIAGNOSIS — I10 Essential (primary) hypertension: Secondary | ICD-10-CM | POA: Diagnosis not present

## 2022-02-10 DIAGNOSIS — C50912 Malignant neoplasm of unspecified site of left female breast: Secondary | ICD-10-CM | POA: Diagnosis not present

## 2022-02-10 LAB — CBC
HCT: 41.3 % (ref 36.0–46.0)
Hemoglobin: 13 g/dL (ref 12.0–15.0)
MCH: 26.2 pg (ref 26.0–34.0)
MCHC: 31.5 g/dL (ref 30.0–36.0)
MCV: 83.3 fL (ref 80.0–100.0)
Platelets: 235 K/uL (ref 150–400)
RBC: 4.96 MIL/uL (ref 3.87–5.11)
RDW: 14.2 % (ref 11.5–15.5)
WBC: 15.7 K/uL — ABNORMAL HIGH (ref 4.0–10.5)
nRBC: 0 % (ref 0.0–0.2)

## 2022-02-10 LAB — BASIC METABOLIC PANEL WITH GFR
Anion gap: 8 (ref 5–15)
BUN: 13 mg/dL (ref 8–23)
CO2: 25 mmol/L (ref 22–32)
Calcium: 8.8 mg/dL — ABNORMAL LOW (ref 8.9–10.3)
Chloride: 104 mmol/L (ref 98–111)
Creatinine, Ser: 0.91 mg/dL (ref 0.44–1.00)
GFR, Estimated: >60 mL/min
Glucose, Bld: 124 mg/dL — ABNORMAL HIGH (ref 70–99)
Potassium: 3.8 mmol/L (ref 3.5–5.1)
Sodium: 137 mmol/L (ref 135–145)

## 2022-02-10 MED ORDER — TRAMADOL HCL 50 MG PO TABS: 50.0000 mg | ORAL_TABLET | Freq: Four times a day (QID) | ORAL | 0 refills | Status: DC | PRN

## 2022-02-10 MED ORDER — HEPARIN SOD (PORK) LOCK FLUSH 100 UNIT/ML IV SOLN
500.0000 [IU] | INTRAVENOUS | Status: AC | PRN
  Administered 2022-02-10: 500 [IU]

## 2022-02-10 MED ORDER — SODIUM CHLORIDE 0.9% FLUSH: 10.0000 mL | INTRAVENOUS | Status: DC | PRN

## 2022-02-10 MED ORDER — CHLORHEXIDINE GLUCONATE CLOTH 2 % EX PADS
6.0000 | MEDICATED_PAD | Freq: Every day | CUTANEOUS | Status: DC
  Administered 2022-02-10: 6 via TOPICAL

## 2022-02-10 NOTE — Progress Notes (Signed)
RN went over DC instructions with the patient and she stated Understanding. IV has been removed, waiting for IV team to deaccess her port so she can DC.

## 2022-02-10 NOTE — Progress Notes (Signed)
Patient ID: [name], female   DOB: Feb 19, 1945, 77 y.o.   MRN: [id number]   Doing well Plan discharge home today

## 2022-02-10 NOTE — Discharge Instructions (Signed)
[facility] [phone]  MASTECTOMY: POST OP INSTRUCTIONS  Always review your discharge instruction sheet given to you by the facility where your surgery was performed. IF YOU HAVE DISABILITY OR FAMILY LEAVE FORMS, YOU MUST BRING THEM TO THE OFFICE FOR PROCESSING.   DO NOT GIVE THEM TO YOUR DOCTOR. A prescription for pain medication may be given to you upon discharge.  Take your pain medication as prescribed, if needed.  If narcotic pain medicine is not needed, then you may take acetaminophen (Tylenol) or ibuprofen (Advil) as needed. Take your usually prescribed medications unless otherwise directed. If you need a refill on your pain medication, please contact your pharmacy.  They will contact our office to request authorization.  Prescriptions will not be filled after 5pm or on week-ends. You should follow a light diet the first few days after arrival home, such as soup and crackers, etc.  Resume your normal diet the day after surgery. Most patients will experience some swelling and bruising on the chest and underarm.  Ice packs will help.  Swelling and bruising can take several days to resolve.  It is common to experience some constipation if taking pain medication after surgery.  Increasing fluid intake and taking a stool softener (such as Colace) will usually help or prevent this problem from occurring.  A mild laxative (Milk of Magnesia or Miralax) should be taken according to package instructions if there are no bowel movements after 48 hours. Unless discharge instructions indicate otherwise, leave your bandage dry and in place until your next appointment in 3-5 days.  You may take a limited sponge bath.  No tube baths or showers until the drains are removed.  You may have steri-strips (small skin tapes) in place directly over the incision.  These strips should be left on the skin for 7-10 days.  If your surgeon used skin glue on the incision, you may shower in 24 hours.   The glue will flake off over the next 2-3 weeks.  Any sutures or staples will be removed at the office during your follow-up visit. DRAINS:  If you have drains in place, it is important to keep a list of the amount of drainage produced each day in your drains.  Before leaving the hospital, you should be instructed on drain care.  Call our office if you have any questions about your drains. ACTIVITIES:  You may resume regular (light) daily activities beginning the next day--such as daily self-care, walking, climbing stairs--gradually increasing activities as tolerated.  You may have sexual intercourse when it is comfortable.  Refrain from any heavy lifting or straining until approved by your doctor. You may drive when you are no longer taking prescription pain medication, you can comfortably wear a seatbelt, and you can safely maneuver your car and apply brakes. RETURN TO WORK:  __________________________________________________________ Dennis Bast should see your doctor in the office for a follow-up appointment approximately 3-5 days after your surgery.  Your doctor's nurse will typically make your follow-up appointment when she calls you with your pathology report.  Expect your pathology report 2-3 business days after your surgery.  You may call to check if you do not hear from [place] after three days.   OTHER INSTRUCTIONS: RECORD DRAIN OUTPUT ICE PACK, TYLENOL, AND IBUPROFEN ALSO FOR PAIN YOU MAY SHOWER STARTING TOMORROW ______________________________________________________________________________________________ ____________________________________________________________________________________________ WHEN TO CALL YOUR DOCTOR: Fever over 101.0 Nausea and/or vomiting Extreme swelling or bruising Continued bleeding from incision. Increased pain, redness, or drainage from the incision. The  clinic staff is available to answer your questions during regular business hours.  Please don't hesitate to call and ask  to speak to one of the nurses for clinical concerns.  If you have a medical emergency, go to the nearest emergency room or call 911.  A surgeon from [facility] is always on call at the hospital. [address], [place], [place], [place]  [address] ? [address], [place], [place]   [address] [phone] ? [phone] ? FAX [phone] Web site: [email or web address]

## 2022-02-10 NOTE — Care Management Obs Status (Signed)
[facility] NOTIFICATION   Patient Details  Name: [name] MRN: [id number] Date of Birth: 10/14/1945   Medicare Observation Status Notification Given:  Yes    Carles Collet, RN 02/10/2022, 7:48 AM

## 2022-02-10 NOTE — Progress Notes (Signed)
Husband needed a note for work paged Dr. Ninfa Linden he stated he was not in the hospital anymore and that he would have to pick up the note from the office. Told patients husband and he stated understanding would go and grab the note from there RN also spoke with [employer] staff at Dr. Trevor Mace office to notify them he was coming for the note.

## 2022-02-10 NOTE — Discharge Summary (Signed)
Physician Discharge Summary  Patient ID: [name] MRN: [id number] DOB/AGE: 09-22-1945 77 y.o.  Admit date: 02/09/2022 Discharge date: 02/10/2022  Admission Diagnoses:  Discharge Diagnoses:  Principal Problem:   Breast cancer [facility]) Active Problems:   S/P bilateral mastectomy   Discharged Condition: good  Hospital Course: Uneventful post op recover.  Discharged home POD#1 doing well  Consults: None  Significant Diagnostic Studies:   Treatments: surgery: bilateral total mastectomy with bilateral sentinel node biopsy, port a cath insertion  Discharge Exam: Blood pressure (!) 151/52, pulse (!) 59, temperature 97.9 F (36.6 C), temperature source Oral, resp. rate 18, height '5\' 3"'$  (1.6 m), weight 83.9 kg, SpO2 95 %. General appearance: alert, cooperative, and no distress Resp: clear to auscultation bilaterally Cardio: regular rate and rhythm, S1, S2 normal, no murmur, click, rub or gallop Incision/Wound:skin flaps viable, drains serosang  Disposition: Discharge disposition: 01-Home or Self Care        Allergies as of 02/10/2022       Reactions   Codeine Nausea Only, Rash        Medication List     TAKE these medications    albuterol 108 (90 Base) MCG/ACT inhaler Commonly known as: VENTOLIN HFA Inhale 1 puff into the lungs every 6 (six) hours as needed for wheezing or shortness of breath.   amLODipine 10 MG tablet Commonly known as: NORVASC Take 10 mg by mouth daily.   aspirin EC 81 MG tablet Take 81 mg by mouth daily.   Breo Ellipta 100-25 MCG/ACT Aepb Generic drug: fluticasone furoate-vilanterol Inhale 1 puff into the lungs daily.   irbesartan-hydrochlorothiazide 300-12.5 MG tablet Commonly known as: AVALIDE Take 1 tablet by mouth daily.   levothyroxine 50 MCG tablet Commonly known as: SYNTHROID Take 50 mcg by mouth daily before breakfast.   metoprolol tartrate 50 MG tablet Commonly known as: LOPRESSOR Take 50 mg by mouth 2 (two) times  daily.   montelukast 10 MG tablet Commonly known as: SINGULAIR Take 10 mg by mouth at bedtime.   PRESCRIPTION MEDICATION Inject 1 Units as directed once a week. Allergy shot   rosuvastatin 5 MG tablet Commonly known as: CRESTOR Take 5 mg by mouth daily.   traMADol 50 MG tablet Commonly known as: ULTRAM Take 1 tablet (50 mg total) by mouth every 6 (six) hours as needed for moderate pain or severe pain.   Vitamin D 50 MCG (2000 UT) Caps Take 4,000 Units by mouth daily.        Follow-up Information     Coralie Keens, MD. Call on 03/02/2022.   Specialty: General Surgery Why: For wound re-check call the office for the time Contact information: [place] [address] [phone]                 Signed: Coralie Keens 02/10/2022, 7:36 AM

## 2022-02-11 NOTE — Addendum Note (Signed)
Addended by: [name] on: 02/11/2022 05:58 PM   Modules accepted: Orders

## 2022-02-12 ENCOUNTER — Telehealth: Payer: Self-pay | Admitting: Neurology

## 2022-02-12 DIAGNOSIS — C50911 Malignant neoplasm of unspecified site of right female breast: Secondary | ICD-10-CM | POA: Diagnosis not present

## 2022-02-12 DIAGNOSIS — C50912 Malignant neoplasm of unspecified site of left female breast: Secondary | ICD-10-CM | POA: Diagnosis not present

## 2022-02-12 NOTE — Telephone Encounter (Signed)
-----  Message from Darleen Crocker, RN sent at 02/12/2022  9:01 AM EST -----  ----- Message ----- From: [name], MD Sent: 02/11/2022   5:58 PM EST To: Gna-Pod 4 Results  Patient referred by Dr. Dagmar Hait, seen by me on 10/01/21, diagnostic PSG on 02/03/22.    Please call and notify the patient that the recent sleep study showed mild obstructive sleep apnea with at times severe desaturations, as low at 79%. I recommend treatment in the form of autoPAP, which means, that we don't have to bring her back for a second sleep study with CPAP, but will let him try an autoPAP machine at home, through a DME company (of her choice, or as per insurance requirement). The DME representative will educate her on how to use the machine, how to put the mask on, etc. I have placed an order in the chart. Please send referral, talk to patient, send report to referring MD. We will need a FU in sleep clinic for 10 weeks post-PAP set up, please arrange that with me or one of our NPs. Thanks,   [name], MD, PhD [facility])

## 2022-02-12 NOTE — Telephone Encounter (Signed)
I called pt. I advised pt that Dr. Rexene Alberts reviewed their sleep study results and found that pt has sleep apnea. Dr. Rexene Alberts recommends that pt starts auto CPAP. I reviewed PAP compliance expectations with the pt. Pt is agreeable to starting a CPAP. I advised pt that an order will be sent to a DME, [facility], and [facility] will call the pt within about one week after they file with the pt's insurance. [facility] will show the pt how to use the machine, fit for masks, and troubleshoot the CPAP if needed. A follow up appt was made for insurance purposes with Judson Roch NP on 04/21/22 at 10:15 am. Pt verbalized understanding to arrive 15 minutes early and bring their CPAP. Pt verbalized understanding of results. Pt had no questions at this time but was encouraged to call back if questions arise. I have sent the order to [facility] and have received confirmation that they have received the order.

## 2022-02-19 ENCOUNTER — Encounter: Payer: Self-pay | Admitting: *Deleted

## 2022-02-25 DIAGNOSIS — I10 Essential (primary) hypertension: Secondary | ICD-10-CM | POA: Diagnosis not present

## 2022-02-25 DIAGNOSIS — G4733 Obstructive sleep apnea (adult) (pediatric): Secondary | ICD-10-CM | POA: Diagnosis not present

## 2022-02-26 ENCOUNTER — Ambulatory Visit: Payer: Medicare HMO | Admitting: Hematology and Oncology

## 2022-02-26 ENCOUNTER — Inpatient Hospital Stay: Payer: Medicare HMO | Attending: Hematology and Oncology | Admitting: Hematology and Oncology

## 2022-02-26 ENCOUNTER — Inpatient Hospital Stay: Payer: Medicare HMO

## 2022-02-26 ENCOUNTER — Inpatient Hospital Stay: Payer: Medicare HMO | Admitting: Pharmacist

## 2022-02-26 ENCOUNTER — Encounter: Payer: Self-pay | Admitting: Hematology and Oncology

## 2022-02-26 VITALS — BP 157/75 | HR 68 | Temp 97.7°F | Resp 16 | Ht 63.0 in | Wt 188.1 lb

## 2022-02-26 DIAGNOSIS — Z9013 Acquired absence of bilateral breasts and nipples: Secondary | ICD-10-CM | POA: Diagnosis not present

## 2022-02-26 DIAGNOSIS — I10 Essential (primary) hypertension: Secondary | ICD-10-CM | POA: Diagnosis not present

## 2022-02-26 DIAGNOSIS — Z905 Acquired absence of kidney: Secondary | ICD-10-CM | POA: Insufficient documentation

## 2022-02-26 DIAGNOSIS — Z79899 Other long term (current) drug therapy: Secondary | ICD-10-CM | POA: Diagnosis not present

## 2022-02-26 DIAGNOSIS — Z7982 Long term (current) use of aspirin: Secondary | ICD-10-CM | POA: Insufficient documentation

## 2022-02-26 DIAGNOSIS — C50411 Malignant neoplasm of upper-outer quadrant of right female breast: Secondary | ICD-10-CM | POA: Diagnosis not present

## 2022-02-26 DIAGNOSIS — Z170 Estrogen receptor positive status [ER+]: Secondary | ICD-10-CM | POA: Insufficient documentation

## 2022-02-26 DIAGNOSIS — C50912 Malignant neoplasm of unspecified site of left female breast: Secondary | ICD-10-CM | POA: Diagnosis not present

## 2022-02-26 DIAGNOSIS — C50911 Malignant neoplasm of unspecified site of right female breast: Secondary | ICD-10-CM

## 2022-02-26 NOTE — Assessment & Plan Note (Signed)
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   She is now status post bilateral mastectomy and is here to review final pathology.  On the right side she has an invasive lobular carcinoma measuring 1.5 cm grade 2, ER/PR positive HER2 negative.  On the left side she has 2 foci for breast cancer, she has a 3 mm IDC and a 6 mm ILC along with DCIS.  The 3 mm focus of IDC is ER/PR and HER2 positive.  We do not have prognostics on the [place].  There is also 1 lymph node with isolated tumor cells.  She is recovering well as expected.  At this time we have discussed that the foci of tumor are small.  We will try to run the prognostics on the invasive lobular carcinoma as well as isolated tumor cells in the lymph node.  If she has HER2 amplified tumor cells in the lymph node, I may consider Taxol Herceptin or at least Herceptin alone for adjuvant therapy.  We will also consider Oncotype testing on the right side to see if she should can be considered for adjuvant chemotherapy once we have more information from the HER2 test results. She understands that antiestrogen therapy will be strongly recommended for 5 to 10 years and given the lobular phenotype.  She may not receive radiation given bilateral mastectomy and tumor size under 5 cm and no obvious microscopic or microscopic meds except for isolated tumor cells.  At this time we are leaning towards aromatase inhibitors for antiestrogen therapy.  I will also order a baseline bone density for screening for osteoporosis.  I will call her next Wednesday with updates and discuss about adjuvant treatment recommendations. Thank you for consulting [place] the care of this patient.  Please do not hesitate to contact us with any additional questions or concerns.

## 2022-02-26 NOTE — Progress Notes (Signed)
[facility] NOTE  Patient Care Team: Prince Solian, MD as PCP - General (Internal Medicine) Rockwell Germany, RN as Oncology Nurse Navigator Mauro Kaufmann, RN as Oncology Nurse Navigator Coralie Keens, MD as Consulting Physician (General Surgery) Benay Pike, MD as Consulting Physician (Hematology and Oncology) Gery Pray, MD as Consulting Physician (Radiation Oncology)  CHIEF COMPLAINTS/PURPOSE OF CONSULTATION:  Newly diagnosed breast cancer  HISTORY OF PRESENTING ILLNESS:   [name] 77 y.o. female is here because of recent diagnosis of right breast cancer  I reviewed her records extensively and collaborated the history with the patient.  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 This is a highly suspicious finding for which ultrasound-guided biopsy is recommended. Adjacent/nearly contiguous complex cystic and solid mass within the RIGHT breast at the 9:30 o'clock axis, 3 cm from the nipple, measuring 1.3 cm. This is a suspicious finding for which ultrasound-guided biopsy is recommended. Highly suspicious calcifications within the inner LEFT breast, with a segmental distribution, measuring 5.7 cm extent. Recommend 2 site stereotactic biopsy to include the more posterior calcifications and the more anterior/retroareolar calcifications.    12/30/2021 Pathology Results   Left breast needle core biopsy from post extent microcalcs showed IDC, overall grade 3.  Prognostics showed ER 100% positive, strong staining intensity, PR 100% positive, strong staining intensity, Ki 67 of 20%. 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  Second area in the right breast needle core biopsy coil clip also showed intermediate grade invasive mammary carcinoma.  Prognostics showed ER 100% positive strong staining PR 15% positive strong staining Ki-67 of 10% and HER2 negative by FISH.  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  She is here today with her husband. Since her last visit here, she had bilateral mastectomy. She has indwelling drains, will be removed Monday by Dr Ninfa Linden. She otherwise has no complaints today Rest of the pertinent 10 point ROS reviewed and neg.  MEDICAL HISTORY:  Past Medical History:  Diagnosis Date   Asthma    Deafness in left ear    WEARS HEARING AID LEFT EAR   GERD (gastroesophageal reflux disease)    Hypertension    Pneumonia    in past x 2    SURGICAL HISTORY: Past Surgical History:  Procedure Laterality Date   ABDOMINAL HYSTERECTOMY     BREAST BIOPSY Left 12/30/2021   MM LT BREAST BX W LOC DEV 1ST LESION IMAGE BX SPEC STEREO GUIDE 12/30/2021 [facility]   BREAST BIOPSY Right 01/02/2022   [place] RT BREAST BX W LOC DEV 1ST LESION IMG BX SPEC US GUIDE 01/02/2022 [facility]   BREAST BIOPSY Right 01/02/2022   [place] RT BREAST  BX W LOC DEV EA ADD LESION IMG BX SPEC US GUIDE 01/02/2022 [facility]   BREAST BIOPSY Left 12/30/2021   MM LT BREAST BX W LOC DEV EA AD LESION IMG BX SPEC STEREO GUIDE 12/30/2021 [facility]   CHOLECYSTECTOMY     EYE SURGERY  2016   bilateral cataract  surgery   NEPHRECTOMY Left 02/04/2015   Procedure: OPEN RADICAL NEPHRECTOMY;  Surgeon: Raynelle Bring, MD;  Location: [facility];  Service: Urology;  Laterality: Left;   PORTACATH PLACEMENT Left 02/09/2022   Procedure: INSERTION PORT-A-CATH;  Surgeon: Coralie Keens, MD;  Location: [place];  Service: General;  Laterality: Left;   SENTINEL NODE BIOPSY Bilateral 02/09/2022   Procedure: BILATERAL SENTINEL NODE BIOPSY;  Surgeon: Coralie Keens, MD;  Location: [place];  Service: General;  Laterality: Bilateral;   TOTAL MASTECTOMY Bilateral 02/09/2022   Procedure: BILATERAL TOTAL MASTECTOMY;  Surgeon: Coralie Keens, MD;  Location: [place];  Service: General;  Laterality: Bilateral;   TUBAL LIGATION      SOCIAL HISTORY: Social History   Socioeconomic History   Marital status: Married    Spouse name: Not on file   Number of children: Not on file   Years of education: Not on file   Highest education level: Not on file  Occupational History   Not on file  Tobacco Use   Smoking status: Never   Smokeless tobacco: Not on file  Substance and Sexual Activity   Alcohol use: No   Drug use: No   Sexual activity: Not on file  Other Topics Concern   Not on file  Social History Narrative   Not on file   Social Determinants of Health   Financial Resource Strain: Low Risk  (01/30/2022)   Overall Financial Resource Strain (CARDIA)    Difficulty of Paying Living Expenses: Not very hard  Food Insecurity: No Food Insecurity (01/30/2022)   Hunger Vital Sign    Worried About Running Out of Food in the Last Year: Never true    Ran Out of Food in the Last Year: Never true  Transportation Needs: Not on file  Physical Activity: Not on file  Stress: Not on file  Social Connections: Not on file  Intimate Partner Violence: Not on file    FAMILY HISTORY: Family History  Problem Relation Age of Onset   Breast cancer Mother    Breast cancer Sister        in her 10s   Sleep apnea Brother     ALLERGIES:   is allergic to codeine.  MEDICATIONS:  Current Outpatient Medications  Medication Sig Dispense Refill   albuterol (PROVENTIL HFA;VENTOLIN HFA) 108 (90 Base) MCG/ACT inhaler Inhale 1 puff into the lungs every 6 (six) hours as needed for wheezing or shortness of breath.     amLODipine (NORVASC) 10 MG tablet Take 10 mg by mouth daily.     aspirin EC 81 MG tablet Take 81 mg by mouth daily.     Cholecalciferol (VITAMIN D) 50 MCG (2000 UT) CAPS Take 4,000 Units by mouth daily.     fluticasone furoate-vilanterol (BREO ELLIPTA) 100-25 MCG/ACT AEPB Inhale 1 puff into the lungs daily.     irbesartan-hydrochlorothiazide (AVALIDE) 300-12.5 MG tablet Take 1 tablet by mouth daily.      levothyroxine (SYNTHROID) 50 MCG tablet Take 50 mcg by mouth daily before breakfast.     metoprolol (LOPRESSOR) 50 MG tablet Take 50 mg by mouth 2 (two) times daily.      montelukast (SINGULAIR)  10 MG tablet Take 10 mg by mouth at bedtime.      PRESCRIPTION MEDICATION Inject 1 Units as directed once a week. Allergy shot     rosuvastatin (CRESTOR) 5 MG tablet Take 5 mg by mouth daily.     traMADol (ULTRAM) 50 MG tablet Take 1 tablet (50 mg total) by mouth every 6 (six) hours as needed for moderate pain or severe pain. 20 tablet 0   No current facility-administered medications for this visit.    REVIEW OF SYSTEMS:   Constitutional: Denies fevers, chills or abnormal night sweats Eyes: Denies blurriness of vision, double vision or watery eyes Ears, nose, mouth, throat, and face: Denies mucositis or sore throat Respiratory: Denies cough, dyspnea or wheezes Cardiovascular: Denies palpitation, chest discomfort or lower extremity swelling Gastrointestinal:  Denies nausea, heartburn or change in bowel habits Skin: Denies abnormal skin rashes Lymphatics: Denies new lymphadenopathy or easy bruising Neurological:Denies numbness, tingling or new weaknesses Behavioral/Psych: Mood is stable, no new changes  Breast: Denies any  palpable lumps or discharge All other systems were reviewed with the patient and are negative.  PHYSICAL EXAMINATION: ECOG PERFORMANCE STATUS: 0 - Asymptomatic  Vitals:   02/26/22 1359  BP: (!) 157/75  Pulse: 68  Resp: 16  Temp: 97.7 F (36.5 C)  SpO2: 98%    Filed Weights   02/26/22 1359  Weight: 188 lb 1.6 oz (85.3 kg)    GENERAL:alert, no distress and comfortable Rest of the PE deferred in lieu of counseling.  LABORATORY DATA:  I have reviewed the data as listed Lab Results  Component Value Date   WBC 15.7 (H) 02/10/2022   HGB 13.0 02/10/2022   HCT 41.3 02/10/2022   MCV 83.3 02/10/2022   PLT 235 02/10/2022   Lab Results  Component Value Date   NA 137 02/10/2022   K 3.8 02/10/2022   CL 104 02/10/2022   CO2 25 02/10/2022    RADIOGRAPHIC STUDIES: I have personally reviewed the radiological reports and agreed with the findings in the report.  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   She is now status post bilateral mastectomy and is here to review final pathology.  On the right side she has an invasive lobular carcinoma measuring 1.5 cm grade 2, ER/PR positive HER2 negative.  On the left side she has 2 foci for breast cancer, she has a 3 mm IDC and a 6 mm ILC along with DCIS.  The 3 mm focus of IDC is ER/PR and HER2 positive.  We do not have prognostics on the [place].  There is also 1 lymph node with isolated tumor cells.  She is recovering well as expected.  At this time we have discussed that the foci of tumor are small.  We will try to run the prognostics on the invasive lobular carcinoma as well as isolated tumor cells in the lymph node.  If she has HER2 amplified tumor cells in the lymph node, I may consider Taxol  Herceptin or at least Herceptin alone for adjuvant therapy.  We will also consider Oncotype testing on the right side to see if she should can be considered for adjuvant chemotherapy once we have more information from the HER2 test results. She understands that antiestrogen therapy will be strongly recommended for 5 to 10  years and given the lobular phenotype.  She may not receive radiation given bilateral mastectomy and tumor size under 5 cm and no obvious microscopic or microscopic meds except for isolated tumor cells.  At this time we are leaning towards aromatase inhibitors for antiestrogen therapy.  I will also order a baseline bone density for screening for osteoporosis.  I will call her next Wednesday with updates and discuss about adjuvant treatment recommendations. Thank you for consulting [place] the care of this patient.  Please do not hesitate to contact us with any additional questions or concerns.      Total time spent: 40 min including H, Physical exam, review of records, counseling and coordination of care. All questions were answered. The patient knows to call the clinic with any problems, questions or concerns.    Benay Pike, MD 02/26/22

## 2022-02-27 ENCOUNTER — Telehealth: Payer: Self-pay | Admitting: Hematology and Oncology

## 2022-02-27 ENCOUNTER — Encounter: Payer: Self-pay | Admitting: *Deleted

## 2022-02-27 NOTE — Telephone Encounter (Signed)
Spoke with patient confirming upcoming appointment 

## 2022-03-04 ENCOUNTER — Telehealth: Payer: Self-pay | Admitting: Hematology and Oncology

## 2022-03-04 ENCOUNTER — Inpatient Hospital Stay (HOSPITAL_BASED_OUTPATIENT_CLINIC_OR_DEPARTMENT_OTHER): Payer: Medicare HMO | Admitting: Hematology and Oncology

## 2022-03-04 ENCOUNTER — Encounter: Payer: Self-pay | Admitting: *Deleted

## 2022-03-04 DIAGNOSIS — C50911 Malignant neoplasm of unspecified site of right female breast: Secondary | ICD-10-CM | POA: Diagnosis not present

## 2022-03-04 DIAGNOSIS — C50912 Malignant neoplasm of unspecified site of left female breast: Secondary | ICD-10-CM | POA: Diagnosis not present

## 2022-03-04 DIAGNOSIS — Z170 Estrogen receptor positive status [ER+]: Secondary | ICD-10-CM

## 2022-03-04 NOTE — Assessment & Plan Note (Signed)
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   This lesion measured 3 mm. ITC noted in the left axillary LN. Unfortunately progs cannot be done on ITC. We discussed about considering herceptin alone. I don't think there is definitive role for chemotherapy. Also we will have to run progs on left ILC.

## 2022-03-04 NOTE — Assessment & Plan Note (Addendum)
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   She has bilateral breast cancer.  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  We will try to run Oncotype on the specimen.  On the left side she has 2 histologies both invasive lobular and invasive ductal.  The invasive ductal which measured about 3 mm is HER2 amplified.   There are also isolated tumor cells in the left axillary lymph node.  We have attempted prognostics on the isolated tumor cells but unfortunately given the scarcity of tumor specimen, this was not possible according to the pathologist.  On the invasive lobular specimen in the left breast cancer we do not have prognostics, these were ordered a few days ago but are unavailable at this time.  We will check back on the status of this.  At this time if Oncotype does not warrant any additional chemotherapy, we will proceed with antiestrogen and Herceptin only.  She is agreeable with this plan.  She will return to clinic in about 2 to 2-1/2 weeks to review the Oncotype results

## 2022-03-04 NOTE — Progress Notes (Signed)
[facility] NOTE  Patient Care Team: Prince Solian, MD as PCP - General (Internal Medicine) Rockwell Germany, RN as Oncology Nurse Navigator Mauro Kaufmann, RN as Oncology Nurse Navigator Coralie Keens, MD as Consulting Physician (General Surgery) Benay Pike, MD as Consulting Physician (Hematology and Oncology) Gery Pray, MD as Consulting Physician (Radiation Oncology)  CHIEF COMPLAINTS/PURPOSE OF CONSULTATION:  Newly diagnosed breast cancer  HISTORY OF PRESENTING ILLNESS:   Erabella B [place] 77 y.o. female is here because of recent diagnosis of right breast cancer  I reviewed her records extensively and collaborated the history with the patient.  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 This is a highly suspicious finding for which ultrasound-guided biopsy is recommended. Adjacent/nearly contiguous complex cystic and solid mass within the RIGHT breast at the 9:30 o'clock axis, 3 cm from the nipple, measuring 1.3 cm. This is a suspicious finding for which ultrasound-guided biopsy is recommended. Highly suspicious calcifications within the inner LEFT breast, with a segmental distribution, measuring 5.7 cm extent. Recommend 2 site stereotactic biopsy to include the more posterior calcifications and the more anterior/retroareolar calcifications.    12/30/2021 Pathology Results   Left breast needle core biopsy from post extent microcalcs showed IDC, overall grade 3.  Prognostics showed ER 100% positive, strong staining intensity, PR 100% positive, strong staining intensity, Ki 67 of 20%. 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  Second area in the right breast needle core biopsy coil clip also showed intermediate grade invasive mammary carcinoma.  Prognostics showed ER 100% positive strong staining PR 15% positive strong staining Ki-67 of 10% and HER2 negative by FISH.  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  She is here for follow up.   MEDICAL HISTORY:  Past Medical History:  Diagnosis Date   Asthma    Deafness in left ear    WEARS HEARING AID LEFT EAR   GERD (gastroesophageal reflux disease)    Hypertension    Pneumonia    in past x 2    SURGICAL HISTORY: Past Surgical History:  Procedure Laterality Date   ABDOMINAL HYSTERECTOMY     BREAST BIOPSY Left 12/30/2021   MM LT BREAST BX W LOC DEV 1ST LESION IMAGE BX SPEC STEREO GUIDE 12/30/2021 [facility]   BREAST BIOPSY Right 01/02/2022   [place] RT BREAST BX W LOC DEV 1ST LESION IMG BX SPEC US GUIDE 01/02/2022 [facility]   BREAST BIOPSY Right 01/02/2022   [place] RT BREAST BX W LOC DEV EA ADD LESION IMG BX SPEC US GUIDE 01/02/2022 [facility]   BREAST BIOPSY Left 12/30/2021   MM LT BREAST BX W LOC DEV EA AD LESION IMG BX SPEC  STEREO GUIDE 12/30/2021 [facility]   CHOLECYSTECTOMY     EYE SURGERY  2016   bilateral cataract surgery   NEPHRECTOMY Left 02/04/2015   Procedure: OPEN RADICAL NEPHRECTOMY;  Surgeon: Raynelle Bring, MD;  Location: [facility];  Service: Urology;  Laterality: Left;   PORTACATH PLACEMENT Left 02/09/2022   Procedure:  INSERTION PORT-A-CATH;  Surgeon: Coralie Keens, MD;  Location: [place];  Service: General;  Laterality: Left;   SENTINEL NODE BIOPSY Bilateral 02/09/2022   Procedure: BILATERAL SENTINEL NODE BIOPSY;  Surgeon: Coralie Keens, MD;  Location: [place];  Service: General;  Laterality: Bilateral;   TOTAL MASTECTOMY Bilateral 02/09/2022   Procedure: BILATERAL TOTAL MASTECTOMY;  Surgeon: Coralie Keens, MD;  Location: [place];  Service: General;  Laterality: Bilateral;   TUBAL LIGATION      SOCIAL HISTORY: Social History   Socioeconomic History   Marital status: Married    Spouse name: Not on file   Number of children: Not on file   Years of education: Not on file   Highest education level: Not on file  Occupational History   Not on file  Tobacco Use   Smoking status: Never   Smokeless tobacco: Not on file  Substance and Sexual Activity   Alcohol use: No   Drug use: No   Sexual activity: Not on file  Other Topics Concern   Not on file  Social History Narrative   Not on file   Social Determinants of Health   Financial Resource Strain: Low Risk  (01/30/2022)   Overall Financial Resource Strain (CARDIA)    Difficulty of Paying Living Expenses: Not very hard  Food Insecurity: No Food Insecurity (01/30/2022)   Hunger Vital Sign    Worried About Running Out of Food in the Last Year: Never true    Ran Out of Food in the Last Year: Never true  Transportation Needs: Not on file  Physical Activity: Not on file  Stress: Not on file  Social Connections: Not on file  Intimate Partner Violence: Not on file    FAMILY HISTORY: Family History  Problem Relation Age of Onset   Breast cancer Mother    Breast cancer Sister        in her 16s   Sleep apnea Brother     ALLERGIES:  is allergic to codeine.  MEDICATIONS:  Current Outpatient Medications  Medication Sig Dispense Refill   albuterol (PROVENTIL HFA;VENTOLIN HFA) 108 (90 Base) MCG/ACT inhaler Inhale 1 puff into the lungs every 6  (six) hours as needed for wheezing or shortness of breath.     amLODipine (NORVASC) 10 MG tablet Take 10 mg by mouth daily.     aspirin EC 81 MG tablet Take 81 mg by mouth daily.     Cholecalciferol (VITAMIN D) 50 MCG (2000 UT) CAPS Take 4,000 Units by mouth daily.     fluticasone furoate-vilanterol (BREO ELLIPTA) 100-25 MCG/ACT AEPB Inhale 1 puff into the lungs daily.     irbesartan-hydrochlorothiazide (AVALIDE) 300-12.5 MG tablet Take 1 tablet by mouth daily.      levothyroxine (SYNTHROID) 50 MCG tablet Take 50 mcg by mouth daily before breakfast.     metoprolol (LOPRESSOR) 50 MG tablet Take 50 mg by mouth 2 (two) times daily.      montelukast (SINGULAIR) 10 MG tablet Take 10 mg by mouth at bedtime.      PRESCRIPTION MEDICATION Inject 1 Units as directed once a week. Allergy shot     rosuvastatin (CRESTOR) 5 MG tablet  Take 5 mg by mouth daily.     traMADol (ULTRAM) 50 MG tablet Take 1 tablet (50 mg total) by mouth every 6 (six) hours as needed for moderate pain or severe pain. 20 tablet 0   No current facility-administered medications for this visit.    REVIEW OF SYSTEMS:   Constitutional: Denies fevers, chills or abnormal night sweats Eyes: Denies blurriness of vision, double vision or watery eyes Ears, nose, mouth, throat, and face: Denies mucositis or sore throat Respiratory: Denies cough, dyspnea or wheezes Cardiovascular: Denies palpitation, chest discomfort or lower extremity swelling Gastrointestinal:  Denies nausea, heartburn or change in bowel habits Skin: Denies abnormal skin rashes Lymphatics: Denies new lymphadenopathy or easy bruising Neurological:Denies numbness, tingling or new weaknesses Behavioral/Psych: Mood is stable, no new changes  Breast: Denies any palpable lumps or discharge All other systems were reviewed with the patient and are negative.  PHYSICAL EXAMINATION: ECOG PERFORMANCE STATUS: 0 - Asymptomatic  There were no vitals filed for this visit.   There  were no vitals filed for this visit.   GENERAL:alert, no distress and comfortable Rest of the PE deferred in lieu of counseling.  LABORATORY DATA:  I have reviewed the data as listed Lab Results  Component Value Date   WBC 15.7 (H) 02/10/2022   HGB 13.0 02/10/2022   HCT 41.3 02/10/2022   MCV 83.3 02/10/2022   PLT 235 02/10/2022   Lab Results  Component Value Date   NA 137 02/10/2022   K 3.8 02/10/2022   CL 104 02/10/2022   CO2 25 02/10/2022    RADIOGRAPHIC STUDIES: I have personally reviewed the radiological reports and agreed with the findings in the report.  ASSESSMENT AND PLAN:  Invasive ductal carcinoma of left breast (HCC) Left breast biopsy showed invasive ductal carcinoma, high-grade in the anterior calcifications, ER/PR and HER2 amplified.   This lesion measured 3 mm. ITC noted in the left axillary LN. Unfortunately progs cannot be done on ITC. We discussed about considering herceptin alone. I don't think there is definitive role for chemotherapy. Also we will have to run progs on left ILC.  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   She has bilateral breast cancer.  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  We will try to run Oncotype on the specimen.  On the left side she has 2 histologies both invasive lobular and invasive ductal.  The invasive ductal which measured about 3 mm is HER2 amplified.   There are also isolated tumor cells in the left axillary lymph node.  We have attempted prognostics on the isolated tumor cells but unfortunately given the scarcity of tumor specimen, this was not possible according to the pathologist.  On the invasive lobular specimen  in the left breast cancer we do not have prognostics, these were ordered a few days ago but are unavailable at this time.  We will check back on the status of this.  At this time if Oncotype does not warrant any additional chemotherapy, we will proceed with antiestrogen and Herceptin only.  She is agreeable with this plan.  She will return to clinic in about 2 to 2-1/2 weeks to review the Oncotype results   I connected with  Lyndy B Burt Knack  on 03/04/22 by a telephone application and verified that I am speaking with the correct person using two identifiers.   I discussed the limitations of evaluation and management by telemedicine. The patient expressed understanding and agreed to proceed.  Total time spent: 14 min including History, review of records, counseling and coordination of care. All questions were answered. The patient knows to call the clinic with any problems, questions or concerns.    Benay Pike, MD 03/04/22

## 2022-03-04 NOTE — Telephone Encounter (Signed)
Spoke with patient confirming upcoming appointments  

## 2022-03-05 LAB — SURGICAL PATHOLOGY

## 2022-03-06 ENCOUNTER — Telehealth: Payer: Self-pay | Admitting: *Deleted

## 2022-03-06 ENCOUNTER — Encounter: Payer: Self-pay | Admitting: *Deleted

## 2022-03-06 NOTE — Telephone Encounter (Signed)
Ordered oncotype per Dr. Chryl Heck on right breast 1.5cm tumor. Requisition sent to pathology and exact sciences

## 2022-03-10 ENCOUNTER — Inpatient Hospital Stay: Payer: Medicare HMO

## 2022-03-10 ENCOUNTER — Inpatient Hospital Stay (HOSPITAL_BASED_OUTPATIENT_CLINIC_OR_DEPARTMENT_OTHER): Payer: Medicare HMO | Admitting: Genetic Counselor

## 2022-03-10 DIAGNOSIS — Z800 Family history of malignant neoplasm of digestive organs: Secondary | ICD-10-CM

## 2022-03-10 DIAGNOSIS — Z803 Family history of malignant neoplasm of breast: Secondary | ICD-10-CM

## 2022-03-10 DIAGNOSIS — Z170 Estrogen receptor positive status [ER+]: Secondary | ICD-10-CM | POA: Diagnosis not present

## 2022-03-10 DIAGNOSIS — C50911 Malignant neoplasm of unspecified site of right female breast: Secondary | ICD-10-CM | POA: Diagnosis not present

## 2022-03-10 DIAGNOSIS — C50912 Malignant neoplasm of unspecified site of left female breast: Secondary | ICD-10-CM

## 2022-03-10 NOTE — Therapy (Signed)
OUTPATIENT PHYSICAL THERAPY BREAST CANCER POST OP FOLLOW UP   Patient Name: [name] MRN: [id number] DOB:[birth year], 77 y.o., female Today's Date: 03/11/2022  END OF SESSION:  PT End of Session - 03/11/22 1316     Visit Number 2    Number of Visits 2    Date for PT Re-Evaluation 03/13/22    PT Start Time 1000    PT Stop Time 1036    PT Time Calculation (min) 36 min    Activity Tolerance Patient tolerated treatment well    Behavior During Therapy WFL for tasks assessed/performed             Past Medical History:  Diagnosis Date   Asthma    Deafness in left ear    WEARS HEARING AID LEFT EAR   GERD (gastroesophageal reflux disease)    Hypertension    Pneumonia    in past x 2   Past Surgical History:  Procedure Laterality Date   ABDOMINAL HYSTERECTOMY     BREAST BIOPSY Left 12/30/2021   MM LT BREAST BX W LOC DEV 1ST LESION IMAGE BX SPEC STEREO GUIDE 12/30/2021 [facility]   BREAST BIOPSY Right 01/02/2022   [place] RT BREAST BX W LOC DEV 1ST LESION IMG BX SPEC US GUIDE 01/02/2022 [facility]   BREAST BIOPSY Right 01/02/2022   [place] RT BREAST BX W LOC DEV EA ADD LESION IMG BX SPEC US GUIDE 01/02/2022 [facility]   BREAST BIOPSY Left 12/30/2021   MM LT BREAST BX W LOC DEV EA AD LESION IMG BX SPEC STEREO GUIDE 12/30/2021 [facility]   CHOLECYSTECTOMY     EYE SURGERY  2016   bilateral cataract surgery   NEPHRECTOMY Left 02/04/2015   Procedure: OPEN RADICAL NEPHRECTOMY;  Surgeon: Raynelle Bring, MD;  [facility];  Service: Urology;  Laterality: Left;   PORTACATH PLACEMENT Left 02/09/2022   Procedure: INSERTION PORT-A-CATH;  Surgeon: Coralie Keens, MD;  Location: [place];  Service: General;  Laterality: Left;   SENTINEL NODE BIOPSY Bilateral 02/09/2022   Procedure: BILATERAL SENTINEL NODE BIOPSY;  Surgeon: Coralie Keens, MD;  Location: [place];  Service: General;  Laterality: Bilateral;   TOTAL MASTECTOMY Bilateral 02/09/2022   Procedure:  BILATERAL TOTAL MASTECTOMY;  Surgeon: Coralie Keens, MD;  Location: [place];  Service: General;  Laterality: Bilateral;   TUBAL LIGATION     Patient Active Problem List   Diagnosis Date Noted   Breast cancer ([place]) 02/09/2022   S/P bilateral mastectomy 02/09/2022   Invasive ductal carcinoma of left breast ([place]) 01/21/2022   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 Steva Ready  REFERRING PROVIDER: Dr. 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  I have had no pain at all.    PERTINENT HISTORY:  bil mastectomy and SLNB without reconstruction on 02/09/22 Rt cancer: ILC ER/PR positive, HER2 neg, no axillary nodes examined Lt cancer: ILC and IDC ER/PR positive, HER2 pos, 1/5 lymph nodes positive.  Isolated tumor cells in the Left axillary node.  Waiting on oncotype for chemo recommendation but I think I don't  have do do either chemo or radiation.   PATIENT GOALS:  Reassess how my recovery is going related to arm function, pain, and swelling.  PAIN:  Are you having pain? No  PRECAUTIONS: Recent Surgery, left UE Lymphedema risk  ACTIVITY LEVEL / LEISURE: I am just not lifting a lot of heavy stuff.  My husband is not letting me do much.    OBJECTIVE:   PATIENT SURVEYS:  QUICK DASH: 20% from 4.55%  OBSERVATIONS: Small amount of glue still present.  Not wearing  compression or wearing bra.   POSTURE:  Rounded shoulders forward head   LYMPHEDEMA ASSESSMENT:  UPPER EXTREMITY AROM/PROM:   A/PROM RIGHT   eval   03/11/22  Shoulder extension 60 60  Shoulder flexion 155 155  Shoulder abduction 160 170  Shoulder internal rotation     Shoulder external rotation 80 80                          (Blank rows = not tested)   A/PROM LEFT   eval 03/11/22  Shoulder extension 60 60  Shoulder flexion 150 140 - tight in axilla   Shoulder abduction 160 160  Shoulder internal rotation     Shoulder external rotation 85 85                          (Blank rows = not tested)   CERVICAL AROM: All within normal limits:    LYMPHEDEMA ASSESSMENTS:    LANDMARK RIGHT   eval   15cm proximal 37   10 cm proximal to olecranon process 37.5   Olecranon process 31.5   15cm proximal 30.5   10 cm proximal to ulnar styloid process 27.5   Just proximal to ulnar styloid process 18.5   Across hand at thumb web space 20   At base of 2nd digit 6.8   (Blank rows = not tested)     LANDMARK LEFT   eval     36.5   10 cm proximal to olecranon process 37.3   Olecranon process 32     29.5   10 cm proximal to ulnar styloid process 27   Just proximal to ulnar styloid process 18.7   Across hand at thumb web space 20   At base of 2nd digit 6.8   (Blank rows = not tested)   R: 309m on baseline L: [id number] baseline    PATIENT EDUCATION:  Education details: post op per below Person educated: Patient Education method: Explanation, Verbal cues, and Handouts Education comprehension: verbalized understanding  HOME EXERCISE PROGRAM: Reviewed previously given post op HEP.   ASSESSMENT:  CLINICAL IMPRESSION: Pt is doing very well with almost full return to AROM bilaterally and no pain.  She will let usKoreanow if she has any trouble with swelling but has no further needs due to no SOZO.    Pt will benefit from skilled therapeutic intervention to improve on the  following deficits:  Decreased knowledge of precautions, impaired UE functional use, pain, decreased ROM, postural dysfunction.   PT treatment/interventions: ADL/Self care home management, Therapeutic exercises, self care, patient education, re-eval.   GOALS: Goals reviewed with patient? Yes  LONG TERM GOALS:  (STG=LTG)  GOALS Name Target Date  Goal status  1 Pt will demonstrate she has regained full shoulder ROM and function post operatively compared to baselines.  Baseline: 03/11/22 MET                    PLAN:  PT FREQUENCY/DURATION: Discharge  PLAN FOR NEXT SESSION: discharge    [facility]  [address], [address]  [place] [address]  [phone]  After Breast Cancer Class It is recommended you attend the ABC class to be educated on lymphedema risk reduction. This class is free of charge and lasts for 1 hour. It is a 1-time class. You will need to download the Webex app either on your phone or computer. We will send you a link the night before or the morning of the class. You should be able to click on that link to join the class. This is not a confidential class. You don't have to turn your camera on, but other participants may be able to see your email address.  Scar massage You can begin gentle scar massage to you incision sites. Gently place one hand on the incision and move the skin (without sliding on the skin) in various directions. Do this for a few minutes and then you can gently massage either coconut oil or vitamin E cream into the scars.  Compression garment You should continue wearing your compression bra until you feel like you no longer have swelling.  Home exercise Program Continue doing the exercises you were given until you feel like you can do them without feeling any tightness at the end.   Walking Program Studies show that 30 minutes of walking per day (fast enough to elevate your heart rate) can significantly reduce the  risk of a cancer recurrence. If you can't walk due to other medical reasons, we encourage you to find another activity you could do (like a stationary bike or water exercise).  Posture After breast cancer surgery, people frequently sit with rounded shoulders posture because it puts their incisions on slack and feels better. If you sit like this and scar tissue forms in that position, you can become very tight and have pain sitting or standing with good posture. Try to be aware of your posture and sit and stand up tall to heal properly.  Stark Bray, PT 03/11/2022, 1:17 PM   PHYSICAL THERAPY DISCHARGE SUMMARY  Visits from Start of Care: 2  Current functional level related to goals / functional outcomes: No PT needs at this time   Remaining deficits: Lymphedema risk    Education / Equipment: HEP  Plan: Patient agrees to discharge.  Patient is being discharged due to meeting the stated rehab goals.     Shan Levans, PT

## 2022-03-11 ENCOUNTER — Encounter: Payer: Self-pay | Admitting: Genetic Counselor

## 2022-03-11 ENCOUNTER — Encounter: Payer: Self-pay | Admitting: Rehabilitation

## 2022-03-11 ENCOUNTER — Ambulatory Visit: Payer: Medicare HMO | Attending: Surgery | Admitting: Rehabilitation

## 2022-03-11 DIAGNOSIS — C50912 Malignant neoplasm of unspecified site of left female breast: Secondary | ICD-10-CM | POA: Diagnosis not present

## 2022-03-11 DIAGNOSIS — Z9189 Other specified personal risk factors, not elsewhere classified: Secondary | ICD-10-CM | POA: Diagnosis not present

## 2022-03-11 DIAGNOSIS — C50911 Malignant neoplasm of unspecified site of right female breast: Secondary | ICD-10-CM | POA: Insufficient documentation

## 2022-03-11 DIAGNOSIS — R293 Abnormal posture: Secondary | ICD-10-CM | POA: Diagnosis not present

## 2022-03-11 DIAGNOSIS — Z483 Aftercare following surgery for neoplasm: Secondary | ICD-10-CM | POA: Insufficient documentation

## 2022-03-11 NOTE — Progress Notes (Signed)
REFERRING PROVIDER: Benay Pike, MD  PRIMARY PROVIDER:  Prince Solian, MD  PRIMARY REASON FOR VISIT:  1. Invasive ductal carcinoma of left breast ([place])   2. 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 Family history of breast cancer   4. Family history of colon cancer    HISTORY OF PRESENT ILLNESS:   Ms. Haseman, a 77 y.o. female, was seen for a [facility] cancer genetics consultation at the request of Dr. Chryl Heck due to a personal and family history of cancer.  Ms. Pennywell presents to clinic today to discuss the possibility of a hereditary predisposition to cancer, to discuss genetic testing, and to further clarify her future cancer risks, as well as potential cancer risks for family members.   In December 2023, at the age of 22, Ms. Blickenstaff was diagnosed with bilateral breast cancer. She was diagnosed with invasive lobular carcinoma of the right breast and invasive ductal carcinoma of the left breast.   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 This is a highly suspicious finding for which ultrasound-guided biopsy is recommended. Adjacent/nearly contiguous complex cystic and solid mass within the RIGHT breast at the 9:30 o'clock axis, 3 cm from the nipple, measuring 1.3 cm. This is a suspicious finding for which ultrasound-guided biopsy is recommended. Highly suspicious calcifications within the inner LEFT breast, with a segmental distribution, measuring 5.7 cm extent. Recommend 2 site stereotactic biopsy to include the more posterior calcifications and the more anterior/retroareolar calcifications.    12/30/2021 Pathology Results   Left breast needle core biopsy from post extent microcalcs showed IDC, overall grade 3.   Prognostics showed ER 100% positive, strong staining intensity, PR 100% positive, strong staining intensity, Ki 67 of 20%. 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  Second area in the right breast needle core biopsy coil clip also showed intermediate grade invasive mammary carcinoma.  Prognostics showed ER 100% positive strong staining PR 15% positive strong staining Ki-67 of 10% and HER2 negative by FISH.  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  Ovaries intact: no.  Uterus intact: no.  Menopausal status: postmenopausal.  HRT use: 0 years. Colonoscopy: yes;  5 year intervals . Mammogram within the last year: yes. Any excessive radiation exposure in the past: no  Past Medical History:  Diagnosis Date   Asthma    Deafness in left ear    WEARS HEARING AID LEFT EAR   GERD (gastroesophageal reflux disease)    Hypertension    Pneumonia    in past x 2    Past Surgical History:  Procedure Laterality Date   ABDOMINAL HYSTERECTOMY     BREAST BIOPSY Left 12/30/2021   MM LT BREAST BX W LOC DEV  1ST LESION IMAGE BX SPEC STEREO GUIDE 12/30/2021 [facility]   BREAST BIOPSY Right 01/02/2022   [place] RT BREAST BX W LOC DEV 1ST LESION IMG BX SPEC US GUIDE 01/02/2022 [facility]   BREAST BIOPSY Right 01/02/2022   [place] RT BREAST BX W LOC DEV EA ADD LESION IMG BX SPEC US GUIDE 01/02/2022 [facility]   BREAST BIOPSY Left 12/30/2021   MM LT BREAST BX  W LOC DEV EA AD LESION IMG BX SPEC STEREO GUIDE 12/30/2021 [facility]   CHOLECYSTECTOMY     EYE SURGERY  2016   bilateral cataract surgery   NEPHRECTOMY Left 02/04/2015   Procedure: OPEN RADICAL NEPHRECTOMY;  Surgeon: Raynelle Bring, MD;  [facility];  Service: Urology;  Laterality: Left;   PORTACATH PLACEMENT Left 02/09/2022   Procedure: INSERTION PORT-A-CATH;  Surgeon: Coralie Keens, MD;  Location: [place];  Service: General;  Laterality: Left;   SENTINEL NODE BIOPSY Bilateral 02/09/2022   Procedure: BILATERAL SENTINEL NODE BIOPSY;  Surgeon: Coralie Keens, MD;  Location: [place];  Service: General;  Laterality: Bilateral;   TOTAL MASTECTOMY Bilateral 02/09/2022   Procedure: BILATERAL TOTAL MASTECTOMY;  Surgeon: Coralie Keens, MD;  Location: [place];  Service: General;  Laterality: Bilateral;   TUBAL LIGATION      Social History   Socioeconomic History   Marital status: Married    Spouse name: Not on file   Number of children: Not on file   Years of education: Not on file   Highest education level: Not on file  Occupational History   Not on file  Tobacco Use   Smoking status: Never   Smokeless tobacco: Not on file  Substance and Sexual Activity   Alcohol use: No   Drug use: No   Sexual activity: Not on file  Other Topics Concern   Not on file  Social History Narrative   Not on file   Social Determinants of Health   Financial Resource Strain: Low Risk  (01/30/2022)   Overall Financial Resource Strain (CARDIA)    Difficulty of Paying Living Expenses: Not very hard  Food Insecurity: No Food Insecurity (01/30/2022)   Hunger Vital Sign    Worried About Running Out of Food in the Last Year: Never true    Ran Out of Food in the Last Year: Never true  Transportation Needs: Not on file  Physical Activity: Not on file  Stress: Not on file  Social Connections: Not on file     FAMILY HISTORY:  We obtained a detailed, 4-generation family history.  Significant  diagnoses are listed below: Family History  Problem Relation Age of Onset   Breast cancer Mother 106 - 86   Colon cancer Mother 66   Breast cancer Sister 36 - 17   Colon cancer Sister 67 - 66   Sleep apnea Brother    Cancer Brother        unknown type, possibly stomach cancer   Thyroid cancer Daughter 76      Ms. Hund's daughter was diagnosed with thyroid cancer at age 26. Ms. Kilcullen has two sisters. One sister was diagnosed with colon cancer in her mid/late 13s and her second sister was diagnosed with breast cancer in her 42s, both are deceased. Ms. Guerino has three brothers and one was diagnosed with an unknown type of cancer (possibly stomach cancer) at an unknown age. Her mother was diagnosed with breast cancer in her late 46s and colon cancer at age 64, she died at age  42. Ms. Reczek is unaware of previous family history of genetic testing for hereditary cancer risks. There is no reported Ashkenazi Jewish ancestry.   GENETIC COUNSELING ASSESSMENT: Ms. Florencio is a 77 y.o. female with a personal and family history of cancer which is somewhat suggestive of a hereditary predisposition to cancer given her personal history of bilateral breast cancer. We, therefore, discussed and recommended the following at today's visit.   DISCUSSION: We discussed that 5 - 10% of cancer is hereditary, with most cases of breast cancer associated with BRCA1/2.  There are other genes that can be associated with hereditary breast cancer syndromes.  We discussed that testing is beneficial for several reasons including knowing how to follow individuals after completing their treatment, identifying whether potential treatment options would be beneficial, and understanding if other family members could be at risk for cancer and allowing them to undergo genetic testing.   We reviewed the characteristics, features and inheritance patterns of hereditary cancer syndromes. We also discussed genetic testing, including the  appropriate family members to test, the process of testing, insurance coverage and turn-around-time for results. We discussed the implications of a negative, positive, carrier and/or variant of uncertain significant result. We recommended Ms. Slagter pursue genetic testing for a panel that includes genes associated with breast, colon, and thyroid cancer.   Based on Ms. Intrieri's personal and family history of cancer, she meets medical criteria for genetic testing. Despite that she meets criteria, she may still have an out of pocket cost. We discussed that if her out of pocket cost for testing is over $100, the laboratory will call and confirm whether she wants to proceed with testing.  If the out of pocket cost of testing is less than $100 she will be billed by the genetic testing laboratory.   PLAN:  Despite our recommendation, Ms. Frisque did not wish to pursue genetic testing at today's visit. She is concerned her children will be worried about the testing and their stress may outweigh the benefits of testing. She plans to discuss the genetic testing with her children first and then determine if she would like to proceed.  Ms. Warneke questions were answered to her satisfaction today. Our contact information was provided should additional questions or concerns arise. Thank you for the referral and allowing [place] to share in the care of your patient.   Lucille Passy, MS, [facility] Genetic Counselor [email or web address] (P) [phone]  The patient was seen for a total of 35 minutes in face-to-face genetic counseling.  The patient brought her husband.  Drs. Lindi Adie and/or Burr Medico were available to discuss this case as needed.   _______________________________________________________________________ For Office Staff:  Number of people involved in session: 2 Was an Intern/ student involved with case: no

## 2022-03-12 DIAGNOSIS — Z170 Estrogen receptor positive status [ER+]: Secondary | ICD-10-CM | POA: Diagnosis not present

## 2022-03-12 DIAGNOSIS — C50911 Malignant neoplasm of unspecified site of right female breast: Secondary | ICD-10-CM | POA: Diagnosis not present

## 2022-03-16 ENCOUNTER — Encounter (HOSPITAL_COMMUNITY): Payer: Self-pay

## 2022-03-17 ENCOUNTER — Telehealth: Payer: Self-pay | Admitting: *Deleted

## 2022-03-17 ENCOUNTER — Inpatient Hospital Stay: Payer: Medicare HMO | Attending: Hematology and Oncology | Admitting: Hematology and Oncology

## 2022-03-17 DIAGNOSIS — Z79811 Long term (current) use of aromatase inhibitors: Secondary | ICD-10-CM | POA: Insufficient documentation

## 2022-03-17 DIAGNOSIS — C50911 Malignant neoplasm of unspecified site of right female breast: Secondary | ICD-10-CM | POA: Diagnosis not present

## 2022-03-17 DIAGNOSIS — Z170 Estrogen receptor positive status [ER+]: Secondary | ICD-10-CM | POA: Insufficient documentation

## 2022-03-17 DIAGNOSIS — C50912 Malignant neoplasm of unspecified site of left female breast: Secondary | ICD-10-CM | POA: Diagnosis not present

## 2022-03-17 DIAGNOSIS — C50411 Malignant neoplasm of upper-outer quadrant of right female breast: Secondary | ICD-10-CM | POA: Insufficient documentation

## 2022-03-17 DIAGNOSIS — Z79899 Other long term (current) drug therapy: Secondary | ICD-10-CM | POA: Insufficient documentation

## 2022-03-17 MED ORDER — ANASTROZOLE 1 MG PO TABS: 1.0000 mg | ORAL_TABLET | Freq: Every day | ORAL | 3 refills | Status: DC

## 2022-03-17 NOTE — Telephone Encounter (Signed)
Received oncotype score of 10. Physician team notified.

## 2022-03-17 NOTE — Progress Notes (Signed)
START ON PATHWAY REGIMEN - Breast     A cycle is every 28 days:     Anastrozole   **Always confirm dose/schedule in your pharmacy ordering system**  Patient Characteristics: Postoperative without Neoadjuvant Therapy (Pathologic Staging), Invasive Disease, Adjuvant Therapy, HER2 Positive, ER Positive, Node Negative, pT1a, pN0/N77m, Hormonal Therapy Indicated, Postmenopausal Therapeutic Status: Postoperative without Neoadjuvant Therapy (Pathologic Staging) AJCC Grade: G2 AJCC N Category: pN0(i+) AJCC M Category: cM0 ER Status: Positive (+) AJCC 8 Stage Grouping: IA HER2 Status: Positive (+) Oncotype Dx Recurrence Score: Not Appropriate AJCC T Category: pT1a PR Status: Positive (+) Intervention Indicated: Hormonal Therapy Menopausal Status: Postmenopausal Intent of Therapy: Curative Intent, Discussed with Patient

## 2022-03-17 NOTE — Progress Notes (Unsigned)
[place] NOTE  Patient Care Team: Prince Solian, MD as PCP - General (Internal Medicine) Rockwell Germany, RN as Oncology Nurse Navigator Mauro Kaufmann, RN as Oncology Nurse Navigator Coralie Keens, MD as Consulting Physician (General Surgery) Benay Pike, MD as Consulting Physician (Hematology and Oncology) Gery Pray, MD as Consulting Physician (Radiation Oncology)  CHIEF COMPLAINTS/PURPOSE OF CONSULTATION:  Newly diagnosed breast cancer  HISTORY OF PRESENTING ILLNESS:   [name] 77 y.o. female is here because of recent diagnosis of right breast cancer  I reviewed her records extensively and collaborated the history with the patient.  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 This is a highly suspicious finding for which ultrasound-guided biopsy is recommended. Adjacent/nearly contiguous complex cystic and solid mass within the RIGHT breast at the 9:30 o'clock axis, 3 cm from the nipple, measuring 1.3 cm. This is a suspicious finding for which ultrasound-guided biopsy is recommended. Highly suspicious calcifications within the inner LEFT breast, with a segmental distribution, measuring 5.7 cm extent. Recommend 2 site stereotactic biopsy to include the more posterior calcifications and the more anterior/retroareolar calcifications.    12/30/2021 Pathology Results   Left breast needle core biopsy from post extent microcalcs showed IDC, overall grade 3.  Prognostics showed ER 100% positive, strong staining intensity, PR 100% positive, strong staining intensity, Ki 67 of 20%. 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  Second area in the right breast needle core biopsy coil clip also showed intermediate grade invasive mammary carcinoma.  Prognostics showed ER 100% positive strong staining PR 15% positive strong staining Ki-67 of 10% and HER2 negative by FISH.  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  She is here for telephone follow up to review oncotype results.  MEDICAL HISTORY:  Past Medical History:  Diagnosis Date   Asthma    Deafness in left ear    WEARS HEARING AID LEFT EAR   GERD (gastroesophageal reflux disease)    Hypertension    Pneumonia    in past x 2    SURGICAL HISTORY: Past Surgical History:  Procedure Laterality Date   ABDOMINAL HYSTERECTOMY     BREAST BIOPSY Left 12/30/2021   MM LT BREAST BX W  LOC DEV 1ST LESION IMAGE BX SPEC STEREO GUIDE 12/30/2021 [facility]   BREAST BIOPSY Right 01/02/2022   [place] RT BREAST BX W LOC DEV 1ST LESION IMG BX SPEC US GUIDE 01/02/2022 [facility]   BREAST BIOPSY Right 01/02/2022   [place] RT BREAST BX W LOC DEV EA ADD LESION IMG BX SPEC US GUIDE  01/02/2022 [facility]   BREAST BIOPSY Left 12/30/2021   MM LT BREAST BX W LOC DEV EA AD LESION IMG BX SPEC STEREO GUIDE 12/30/2021 [facility]   CHOLECYSTECTOMY     EYE SURGERY  2016   bilateral cataract surgery   NEPHRECTOMY Left 02/04/2015   Procedure: OPEN RADICAL NEPHRECTOMY;  Surgeon: Raynelle Bring, MD;  [facility];  Service: Urology;  Laterality: Left;   PORTACATH PLACEMENT Left 02/09/2022   Procedure: INSERTION PORT-A-CATH;  Surgeon: Coralie Keens, MD;  Location: [place];  Service: General;  Laterality: Left;   SENTINEL NODE BIOPSY Bilateral 02/09/2022   Procedure: BILATERAL SENTINEL NODE BIOPSY;  Surgeon: Coralie Keens, MD;  Location: [place];  Service: General;  Laterality: Bilateral;   TOTAL MASTECTOMY Bilateral 02/09/2022   Procedure: BILATERAL TOTAL MASTECTOMY;  Surgeon: Coralie Keens, MD;  Location: [place];  Service: General;  Laterality: Bilateral;   TUBAL LIGATION      SOCIAL HISTORY: Social History   Socioeconomic History   Marital status: Married    Spouse name: Not on file   Number of children: Not on file   Years of education: Not on file   Highest education level: Not on file  Occupational History   Not on file  Tobacco Use   Smoking status: Never   Smokeless tobacco: Not on file  Substance and Sexual Activity   Alcohol use: No   Drug use: No   Sexual activity: Not on file  Other Topics Concern   Not on file  Social History Narrative   Not on file   Social Determinants of Health   Financial Resource Strain: Low Risk  (01/30/2022)   Overall Financial Resource Strain (CARDIA)    Difficulty of Paying Living Expenses: Not very hard  Food Insecurity: No Food Insecurity (01/30/2022)   Hunger Vital Sign    Worried About Running Out of Food in the Last Year: Never true    Ran Out of Food in the Last Year: Never true  Transportation Needs: Not on file  Physical Activity: Not on file  Stress: Not on file  Social Connections: Not on  file  Intimate Partner Violence: Not on file    FAMILY HISTORY: Family History  Problem Relation Age of Onset   Breast cancer Mother 58 - 34   Colon cancer Mother 64   Breast cancer Sister 63 - 22   Colon cancer Sister 58 - 23   Sleep apnea Brother    Cancer Brother        unknown type, possibly stomach cancer   Thyroid cancer Daughter 83    ALLERGIES:  is allergic to codeine.  MEDICATIONS:  Current Outpatient Medications  Medication Sig Dispense Refill   anastrozole (ARIMIDEX) 1 MG tablet Take 1 tablet (1 mg total) by mouth daily. 90 tablet 3   albuterol (PROVENTIL HFA;VENTOLIN HFA) 108 (90 Base) MCG/ACT inhaler Inhale 1 puff into the lungs every 6 (six) hours as needed for wheezing or shortness of breath.     amLODipine (NORVASC) 10 MG tablet Take 10 mg by mouth daily.     aspirin EC 81 MG tablet Take 81  mg by mouth daily.     Cholecalciferol (VITAMIN D) 50 MCG (2000 UT) CAPS Take 4,000 Units by mouth daily.     fluticasone furoate-vilanterol (BREO ELLIPTA) 100-25 MCG/ACT AEPB Inhale 1 puff into the lungs daily.     irbesartan-hydrochlorothiazide (AVALIDE) 300-12.5 MG tablet Take 1 tablet by mouth daily.      levothyroxine (SYNTHROID) 50 MCG tablet Take 50 mcg by mouth daily before breakfast.     metoprolol (LOPRESSOR) 50 MG tablet Take 50 mg by mouth 2 (two) times daily.      montelukast (SINGULAIR) 10 MG tablet Take 10 mg by mouth at bedtime.      PRESCRIPTION MEDICATION Inject 1 Units as directed once a week. Allergy shot     rosuvastatin (CRESTOR) 5 MG tablet Take 5 mg by mouth daily.     traMADol (ULTRAM) 50 MG tablet Take 1 tablet (50 mg total) by mouth every 6 (six) hours as needed for moderate pain or severe pain. 20 tablet 0   No current facility-administered medications for this visit.    REVIEW OF SYSTEMS:   Constitutional: Denies fevers, chills or abnormal night sweats Eyes: Denies blurriness of vision, double vision or watery eyes Ears, nose, mouth, throat,  and face: Denies mucositis or sore throat Respiratory: Denies cough, dyspnea or wheezes Cardiovascular: Denies palpitation, chest discomfort or lower extremity swelling Gastrointestinal:  Denies nausea, heartburn or change in bowel habits Skin: Denies abnormal skin rashes Lymphatics: Denies new lymphadenopathy or easy bruising Neurological:Denies numbness, tingling or new weaknesses Behavioral/Psych: Mood is stable, no new changes  Breast: Denies any palpable lumps or discharge All other systems were reviewed with the patient and are negative.  PHYSICAL EXAMINATION: ECOG PERFORMANCE STATUS: 0 - Asymptomatic  PE deferred, telephone visit.  LABORATORY DATA:  I have reviewed the data as listed Lab Results  Component Value Date   WBC 15.7 (H) 02/10/2022   HGB 13.0 02/10/2022   HCT 41.3 02/10/2022   MCV 83.3 02/10/2022   PLT 235 02/10/2022   Lab Results  Component Value Date   NA 137 02/10/2022   K 3.8 02/10/2022   CL 104 02/10/2022   CO2 25 02/10/2022    RADIOGRAPHIC STUDIES: I have personally reviewed the radiological reports and agreed with the findings in the report.  ASSESSMENT AND PLAN:  Invasive ductal carcinoma of left breast (HCC) Left breast biopsy showed invasive ductal carcinoma, high-grade in the anterior calcifications, ER/PR and HER2 amplified.   This lesion measured 3 mm. ITC noted in the left axillary LN. Unfortunately progs cannot be done on ITC. We discussed about considering herceptin alone. I don't think there is definitive role for chemotherapy. Left ILC mastectomy showed ER, PR positive, Her 2 neg, Ki 67 5% She is agreeable to proceeding with herceptin alone for adjuvant therapy.  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    She has bilateral breast cancer.  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   Oncotype of 10, no role for adjuvant chemotherapy. We discussed about proceeding with anti estrogen therapy She will continue anastrozole, once again discussed mechanism of action, adverse effects of aromatase inhibitors including but not limited to hot flashes, vaginal dryness, bone density loss etc. All questions answered to the  best of my knowledge.   I connected with  [name] on 03/18/22 by a telephone application and verified that I am speaking with the correct person using two identifiers.   I discussed the limitations of evaluation and management by telemedicine. The patient expressed understanding and agreed to proceed.  Total time spent: 15 min including History, review of records, counseling and coordination of care. All questions were answered. The patient knows to call the clinic with any problems, questions or concerns.    Benay Pike, MD 03/18/22

## 2022-03-18 ENCOUNTER — Encounter: Payer: Self-pay | Admitting: Hematology and Oncology

## 2022-03-18 ENCOUNTER — Other Ambulatory Visit: Payer: Self-pay

## 2022-03-18 NOTE — Assessment & Plan Note (Signed)
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   This lesion measured 3 mm. ITC noted in the left axillary LN. Unfortunately progs cannot be done on ITC. We discussed about considering herceptin alone. I don't think there is definitive role for chemotherapy. Left ILC mastectomy showed ER, PR positive, Her 2 neg, Ki 67 5% She is agreeable to proceeding with herceptin alone for adjuvant therapy.

## 2022-03-18 NOTE — Assessment & Plan Note (Signed)
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   She has bilateral breast cancer.  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   Oncotype of 10, no role for adjuvant chemotherapy. We discussed about proceeding with anti estrogen therapy She will continue anastrozole, once again discussed mechanism of action, adverse effects of aromatase inhibitors including but not limited to hot flashes, vaginal dryness, bone density loss etc. All questions answered to the best of my knowledge.

## 2022-03-24 ENCOUNTER — Inpatient Hospital Stay: Payer: Medicare HMO

## 2022-03-24 ENCOUNTER — Other Ambulatory Visit: Payer: Self-pay | Admitting: *Deleted

## 2022-03-24 ENCOUNTER — Telehealth: Payer: Self-pay | Admitting: Hematology and Oncology

## 2022-03-24 DIAGNOSIS — Z79899 Other long term (current) drug therapy: Secondary | ICD-10-CM | POA: Diagnosis not present

## 2022-03-24 DIAGNOSIS — Z79811 Long term (current) use of aromatase inhibitors: Secondary | ICD-10-CM | POA: Diagnosis not present

## 2022-03-24 DIAGNOSIS — C50912 Malignant neoplasm of unspecified site of left female breast: Secondary | ICD-10-CM | POA: Diagnosis present

## 2022-03-24 DIAGNOSIS — Z170 Estrogen receptor positive status [ER+]: Secondary | ICD-10-CM | POA: Diagnosis not present

## 2022-03-24 DIAGNOSIS — C50911 Malignant neoplasm of unspecified site of right female breast: Secondary | ICD-10-CM

## 2022-03-24 DIAGNOSIS — C50411 Malignant neoplasm of upper-outer quadrant of right female breast: Secondary | ICD-10-CM | POA: Diagnosis present

## 2022-03-24 LAB — CMP (CANCER CENTER ONLY)
ALT: 19 U/L (ref 0–44)
AST: 19 U/L (ref 15–41)
Albumin: 4 g/dL (ref 3.5–5.0)
Alkaline Phosphatase: 103 U/L (ref 38–126)
Anion gap: 6 (ref 5–15)
BUN: 16 mg/dL (ref 8–23)
CO2: 27 mmol/L (ref 22–32)
Calcium: 9.3 mg/dL (ref 8.9–10.3)
Chloride: 106 mmol/L (ref 98–111)
Creatinine: 0.94 mg/dL (ref 0.44–1.00)
GFR, Estimated: >60 mL/min (ref >60)
Glucose, Bld: 91 mg/dL (ref 70–99)
Potassium: 4.1 mmol/L (ref 3.5–5.1)
Sodium: 139 mmol/L (ref 135–145)
Total Bilirubin: 0.4 mg/dL (ref 0.3–1.2)
Total Protein: 7.8 g/dL (ref 6.5–8.1)

## 2022-03-24 LAB — CBC WITH DIFFERENTIAL (CANCER CENTER ONLY)
Abs Immature Granulocytes: 0.03 10*3/uL (ref 0.00–0.07)
Basophils Absolute: 0.1 10*3/uL (ref 0.0–0.1)
Basophils Relative: 1 %
Eosinophils Absolute: 0.3 10*3/uL (ref 0.0–0.5)
Eosinophils Relative: 3 %
HCT: 43.6 % (ref 36.0–46.0)
Hemoglobin: 13.9 g/dL (ref 12.0–15.0)
Immature Granulocytes: 0 %
Lymphocytes Relative: 41 %
Lymphs Abs: 3.7 10*3/uL (ref 0.7–4.0)
MCH: 26 pg (ref 26.0–34.0)
MCHC: 31.9 g/dL (ref 30.0–36.0)
MCV: 81.6 fL (ref 80.0–100.0)
Monocytes Absolute: 1 10*3/uL (ref 0.1–1.0)
Monocytes Relative: 12 %
Neutro Abs: 4 10*3/uL (ref 1.7–7.7)
Neutrophils Relative %: 43 %
Platelet Count: 256 10*3/uL (ref 150–400)
RBC: 5.34 MIL/uL — ABNORMAL HIGH (ref 3.87–5.11)
RDW: 14.2 % (ref 11.5–15.5)
WBC Count: 9.1 10*3/uL (ref 4.0–10.5)
nRBC: 0 % (ref 0.0–0.2)

## 2022-03-24 NOTE — Telephone Encounter (Signed)
Left patient a vm regarding appointment change  

## 2022-03-26 ENCOUNTER — Encounter: Payer: Self-pay | Admitting: *Deleted

## 2022-03-26 DIAGNOSIS — G4733 Obstructive sleep apnea (adult) (pediatric): Secondary | ICD-10-CM | POA: Diagnosis not present

## 2022-03-26 DIAGNOSIS — I10 Essential (primary) hypertension: Secondary | ICD-10-CM | POA: Diagnosis not present

## 2022-03-27 ENCOUNTER — Telehealth: Payer: Self-pay | Admitting: *Deleted

## 2022-03-27 ENCOUNTER — Other Ambulatory Visit: Payer: Self-pay | Admitting: *Deleted

## 2022-03-27 ENCOUNTER — Inpatient Hospital Stay: Payer: Medicare HMO

## 2022-03-27 VITALS — BP 172/73 | HR 67 | Temp 97.9°F | Resp 14 | Wt 188.5 lb

## 2022-03-27 DIAGNOSIS — C50411 Malignant neoplasm of upper-outer quadrant of right female breast: Secondary | ICD-10-CM | POA: Diagnosis not present

## 2022-03-27 DIAGNOSIS — C50912 Malignant neoplasm of unspecified site of left female breast: Secondary | ICD-10-CM

## 2022-03-27 DIAGNOSIS — Z170 Estrogen receptor positive status [ER+]: Secondary | ICD-10-CM | POA: Diagnosis not present

## 2022-03-27 DIAGNOSIS — C50911 Malignant neoplasm of unspecified site of right female breast: Secondary | ICD-10-CM

## 2022-03-27 DIAGNOSIS — Z79899 Other long term (current) drug therapy: Secondary | ICD-10-CM | POA: Diagnosis not present

## 2022-03-27 DIAGNOSIS — I10 Essential (primary) hypertension: Secondary | ICD-10-CM | POA: Diagnosis not present

## 2022-03-27 DIAGNOSIS — G4733 Obstructive sleep apnea (adult) (pediatric): Secondary | ICD-10-CM | POA: Diagnosis not present

## 2022-03-27 DIAGNOSIS — Z79811 Long term (current) use of aromatase inhibitors: Secondary | ICD-10-CM | POA: Diagnosis not present

## 2022-03-27 MED ORDER — TRASTUZUMAB-ANNS CHEMO 150 MG IV SOLR
8.0000 mg/kg | Freq: Once | INTRAVENOUS | Status: AC
  Administered 2022-03-27: 672 mg via INTRAVENOUS
  Filled 2022-03-27: qty 32

## 2022-03-27 MED ORDER — DIPHENHYDRAMINE HCL 25 MG PO CAPS
50.0000 mg | ORAL_CAPSULE | Freq: Once | ORAL | Status: AC
  Administered 2022-03-27: 50 mg via ORAL
  Filled 2022-03-27: qty 2

## 2022-03-27 MED ORDER — HEPARIN SOD (PORK) LOCK FLUSH 100 UNIT/ML IV SOLN
500.0000 [IU] | Freq: Once | INTRAVENOUS | Status: AC | PRN
  Administered 2022-03-27: 500 [IU]

## 2022-03-27 MED ORDER — ACETAMINOPHEN 325 MG PO TABS
650.0000 mg | ORAL_TABLET | Freq: Once | ORAL | Status: AC
  Administered 2022-03-27: 650 mg via ORAL
  Filled 2022-03-27: qty 2

## 2022-03-27 MED ORDER — SODIUM CHLORIDE 0.9 % IV SOLN: Freq: Once | INTRAVENOUS | Status: AC

## 2022-03-27 MED ORDER — SODIUM CHLORIDE 0.9% FLUSH
10.0000 mL | INTRAVENOUS | Status: DC | PRN
  Administered 2022-03-27: 10 mL

## 2022-03-27 NOTE — Patient Instructions (Signed)
[facility]  Discharge Instructions: Thank you for choosing [facility] to provide your oncology and hematology care.   If you have a lab appointment with the Cancer Center, please go directly to the Cancer Center and check in at the registration area.   Wear comfortable clothing and clothing appropriate for easy access to any Portacath or PICC line.   We strive to give you quality time with your provider. You may need to reschedule your appointment if you arrive late (15 or more minutes).  Arriving late affects you and other patients whose appointments are after yours.  Also, if you miss three or more appointments without notifying the office, you may be dismissed from the clinic at the provider's discretion.      For prescription refill requests, have your pharmacy contact our office and allow 72 hours for refills to be completed.    Today you received the following chemotherapy and/or immunotherapy agents: trastuzumab-anns      To help prevent nausea and vomiting after your treatment, we encourage you to take your nausea medication as directed.  BELOW ARE SYMPTOMS THAT SHOULD BE REPORTED IMMEDIATELY: *FEVER GREATER THAN 100.4 F (38 C) OR HIGHER *CHILLS OR SWEATING *NAUSEA AND VOMITING THAT IS NOT CONTROLLED WITH YOUR NAUSEA MEDICATION *UNUSUAL SHORTNESS OF BREATH *UNUSUAL BRUISING OR BLEEDING *URINARY PROBLEMS (pain or burning when urinating, or frequent urination) *BOWEL PROBLEMS (unusual diarrhea, constipation, pain near the anus) TENDERNESS IN MOUTH AND THROAT WITH OR WITHOUT PRESENCE OF ULCERS (sore throat, sores in mouth, or a toothache) UNUSUAL RASH, SWELLING OR PAIN  UNUSUAL VAGINAL DISCHARGE OR ITCHING   Items with * indicate a potential emergency and should be followed up as soon as possible or go to the Emergency Department if any problems should occur.  Please show the CHEMOTHERAPY ALERT CARD or IMMUNOTHERAPY ALERT CARD  at check-in to the Emergency Department and triage nurse.  Should you have questions after your visit or need to cancel or reschedule your appointment, please contact [facility]  Dept: [phone]  and follow the prompts.  Office hours are 8:00 a.m. to 4:30 p.m. Monday - Friday. Please note that voicemails left after 4:00 p.m. may not be returned until the following business day.  We are closed weekends and major holidays. You have access to a nurse at all times for urgent questions. Please call the main number to the clinic Dept: [phone] and follow the prompts.   For any non-urgent questions, you may also contact your provider using MyChart. We now offer e-Visits for anyone 18 and older to request care online for non-urgent symptoms. For details visit [email or web address].   Also download the MyChart app! Go to the app store, search "MyChart", open the app, select [facility], and log in with your MyChart username and password.   

## 2022-03-27 NOTE — Telephone Encounter (Signed)
See other entry for this date 

## 2022-03-27 NOTE — Progress Notes (Signed)
Patient tolerated trastuzumab infusion well, without issue. Monitored after infusion for 30 minutes and discharged with no complaints.

## 2022-03-27 NOTE — Telephone Encounter (Signed)
This RN provided education on trastuzamab with review of ECHO performed in January.  Reviewed with pt how medication is administrated including 1st does infused at slow rate with increasing post vitals due to potential reaction.  Informed pt above is done with first dose only and then subsequent infusions are given at a flat rate.  Questions answered per above medication.  Printed information given including the names of the biosimulars.  Printed information and pamphlets given per cancer center services and resources as well immunotherapy emergency cards given to both pt and her husband with explanation of how to use.  No further questions- pt understands when she goes to the infusion room today she will be asked to sign a consent form.

## 2022-03-30 ENCOUNTER — Telehealth: Payer: Self-pay | Admitting: *Deleted

## 2022-03-30 NOTE — Telephone Encounter (Signed)
Called pt to see how she did with her recent treatment.  She reports doing well & denies any symptoms other than being cold when she got home.  She didn't take her temp but didn't feel like she had a fever.  Instructed that if it happens again to just check her temp.  She knows how to reach [place] if needed & knows her appts.

## 2022-03-30 NOTE — Telephone Encounter (Signed)
-----   Message from Clyda Hurdle, RN sent at 03/27/2022  4:42 PM EDT ----- Regarding: 1st Time Kanjinti-Dr Iruku 1st Time Kanjinti Dr Iruku's patient. No issues during treatment.

## 2022-04-06 DIAGNOSIS — C50911 Malignant neoplasm of unspecified site of right female breast: Secondary | ICD-10-CM | POA: Diagnosis not present

## 2022-04-06 DIAGNOSIS — C50912 Malignant neoplasm of unspecified site of left female breast: Secondary | ICD-10-CM | POA: Diagnosis not present

## 2022-04-08 DIAGNOSIS — E039 Hypothyroidism, unspecified: Secondary | ICD-10-CM | POA: Diagnosis not present

## 2022-04-08 DIAGNOSIS — Z1212 Encounter for screening for malignant neoplasm of rectum: Secondary | ICD-10-CM | POA: Diagnosis not present

## 2022-04-08 DIAGNOSIS — E559 Vitamin D deficiency, unspecified: Secondary | ICD-10-CM | POA: Diagnosis not present

## 2022-04-08 DIAGNOSIS — E785 Hyperlipidemia, unspecified: Secondary | ICD-10-CM | POA: Diagnosis not present

## 2022-04-08 DIAGNOSIS — I10 Essential (primary) hypertension: Secondary | ICD-10-CM | POA: Diagnosis not present

## 2022-04-09 DIAGNOSIS — C50911 Malignant neoplasm of unspecified site of right female breast: Secondary | ICD-10-CM | POA: Diagnosis not present

## 2022-04-09 DIAGNOSIS — C50912 Malignant neoplasm of unspecified site of left female breast: Secondary | ICD-10-CM | POA: Diagnosis not present

## 2022-04-14 ENCOUNTER — Inpatient Hospital Stay: Payer: Medicare HMO

## 2022-04-14 ENCOUNTER — Inpatient Hospital Stay: Payer: Medicare HMO | Admitting: Hematology and Oncology

## 2022-04-14 DIAGNOSIS — Z1212 Encounter for screening for malignant neoplasm of rectum: Secondary | ICD-10-CM | POA: Diagnosis not present

## 2022-04-14 DIAGNOSIS — C50911 Malignant neoplasm of unspecified site of right female breast: Secondary | ICD-10-CM | POA: Diagnosis not present

## 2022-04-14 DIAGNOSIS — C50912 Malignant neoplasm of unspecified site of left female breast: Secondary | ICD-10-CM | POA: Diagnosis not present

## 2022-04-15 ENCOUNTER — Other Ambulatory Visit: Payer: Self-pay

## 2022-04-15 DIAGNOSIS — J45909 Unspecified asthma, uncomplicated: Secondary | ICD-10-CM | POA: Diagnosis not present

## 2022-04-15 DIAGNOSIS — R82998 Other abnormal findings in urine: Secondary | ICD-10-CM | POA: Diagnosis not present

## 2022-04-15 DIAGNOSIS — Z0000 Encounter for general adult medical examination without abnormal findings: Secondary | ICD-10-CM | POA: Diagnosis not present

## 2022-04-15 DIAGNOSIS — R413 Other amnesia: Secondary | ICD-10-CM | POA: Diagnosis not present

## 2022-04-15 DIAGNOSIS — C50912 Malignant neoplasm of unspecified site of left female breast: Secondary | ICD-10-CM

## 2022-04-15 DIAGNOSIS — Z1331 Encounter for screening for depression: Secondary | ICD-10-CM | POA: Diagnosis not present

## 2022-04-15 DIAGNOSIS — E669 Obesity, unspecified: Secondary | ICD-10-CM | POA: Diagnosis not present

## 2022-04-15 DIAGNOSIS — I10 Essential (primary) hypertension: Secondary | ICD-10-CM | POA: Diagnosis not present

## 2022-04-15 DIAGNOSIS — Z1339 Encounter for screening examination for other mental health and behavioral disorders: Secondary | ICD-10-CM | POA: Diagnosis not present

## 2022-04-15 DIAGNOSIS — E559 Vitamin D deficiency, unspecified: Secondary | ICD-10-CM | POA: Diagnosis not present

## 2022-04-15 DIAGNOSIS — R5383 Other fatigue: Secondary | ICD-10-CM | POA: Diagnosis not present

## 2022-04-15 DIAGNOSIS — E785 Hyperlipidemia, unspecified: Secondary | ICD-10-CM | POA: Diagnosis not present

## 2022-04-15 DIAGNOSIS — E039 Hypothyroidism, unspecified: Secondary | ICD-10-CM | POA: Diagnosis not present

## 2022-04-15 DIAGNOSIS — M1990 Unspecified osteoarthritis, unspecified site: Secondary | ICD-10-CM | POA: Diagnosis not present

## 2022-04-17 ENCOUNTER — Inpatient Hospital Stay (HOSPITAL_BASED_OUTPATIENT_CLINIC_OR_DEPARTMENT_OTHER): Payer: Medicare HMO | Admitting: Hematology and Oncology

## 2022-04-17 ENCOUNTER — Inpatient Hospital Stay: Payer: Medicare HMO | Attending: Hematology and Oncology

## 2022-04-17 ENCOUNTER — Inpatient Hospital Stay: Payer: Medicare HMO

## 2022-04-17 VITALS — BP 157/70 | HR 65 | Temp 97.8°F | Resp 16 | Ht 63.0 in | Wt 189.6 lb

## 2022-04-17 VITALS — BP 137/56 | HR 55 | Temp 97.8°F

## 2022-04-17 DIAGNOSIS — Z5112 Encounter for antineoplastic immunotherapy: Secondary | ICD-10-CM | POA: Diagnosis present

## 2022-04-17 DIAGNOSIS — Z170 Estrogen receptor positive status [ER+]: Secondary | ICD-10-CM | POA: Diagnosis not present

## 2022-04-17 DIAGNOSIS — C50911 Malignant neoplasm of unspecified site of right female breast: Secondary | ICD-10-CM | POA: Diagnosis not present

## 2022-04-17 DIAGNOSIS — Z9013 Acquired absence of bilateral breasts and nipples: Secondary | ICD-10-CM | POA: Diagnosis not present

## 2022-04-17 DIAGNOSIS — C50411 Malignant neoplasm of upper-outer quadrant of right female breast: Secondary | ICD-10-CM | POA: Insufficient documentation

## 2022-04-17 DIAGNOSIS — Z95828 Presence of other vascular implants and grafts: Secondary | ICD-10-CM | POA: Insufficient documentation

## 2022-04-17 DIAGNOSIS — C50912 Malignant neoplasm of unspecified site of left female breast: Secondary | ICD-10-CM

## 2022-04-17 DIAGNOSIS — Z79811 Long term (current) use of aromatase inhibitors: Secondary | ICD-10-CM | POA: Diagnosis not present

## 2022-04-17 DIAGNOSIS — Z79899 Other long term (current) drug therapy: Secondary | ICD-10-CM | POA: Insufficient documentation

## 2022-04-17 HISTORY — DX: Presence of other vascular implants and grafts: Z95.828

## 2022-04-17 LAB — CMP (CANCER CENTER ONLY)
ALT: 30 U/L (ref 0–44)
AST: 27 U/L (ref 15–41)
Albumin: 3.8 g/dL (ref 3.5–5.0)
Alkaline Phosphatase: 98 U/L (ref 38–126)
Anion gap: 8 (ref 5–15)
BUN: 16 mg/dL (ref 8–23)
CO2: 28 mmol/L (ref 22–32)
Calcium: 9 mg/dL (ref 8.9–10.3)
Chloride: 101 mmol/L (ref 98–111)
Creatinine: 0.77 mg/dL (ref 0.44–1.00)
GFR, Estimated: >60 mL/min
Glucose, Bld: 114 mg/dL — ABNORMAL HIGH (ref 70–99)
Potassium: 3.5 mmol/L (ref 3.5–5.1)
Sodium: 137 mmol/L (ref 135–145)
Total Bilirubin: 0.6 mg/dL (ref 0.3–1.2)
Total Protein: 7.8 g/dL (ref 6.5–8.1)

## 2022-04-17 LAB — CBC WITH DIFFERENTIAL (CANCER CENTER ONLY)
Abs Immature Granulocytes: 0.02 10*3/uL (ref 0.00–0.07)
Basophils Absolute: 0.1 10*3/uL (ref 0.0–0.1)
Basophils Relative: 1 %
Eosinophils Absolute: 0.3 10*3/uL (ref 0.0–0.5)
Eosinophils Relative: 3 %
HCT: 42.2 % (ref 36.0–46.0)
Hemoglobin: 13.2 g/dL (ref 12.0–15.0)
Immature Granulocytes: 0 %
Lymphocytes Relative: 36 %
Lymphs Abs: 2.9 10*3/uL (ref 0.7–4.0)
MCH: 25.6 pg — ABNORMAL LOW (ref 26.0–34.0)
MCHC: 31.3 g/dL (ref 30.0–36.0)
MCV: 81.9 fL (ref 80.0–100.0)
Monocytes Absolute: 0.8 10*3/uL (ref 0.1–1.0)
Monocytes Relative: 9 %
Neutro Abs: 4.1 10*3/uL (ref 1.7–7.7)
Neutrophils Relative %: 51 %
Platelet Count: 261 10*3/uL (ref 150–400)
RBC: 5.15 MIL/uL — ABNORMAL HIGH (ref 3.87–5.11)
RDW: 14.3 % (ref 11.5–15.5)
WBC Count: 8 10*3/uL (ref 4.0–10.5)
nRBC: 0 % (ref 0.0–0.2)

## 2022-04-17 MED ORDER — TRASTUZUMAB-ANNS CHEMO 150 MG IV SOLR
6.0000 mg/kg | Freq: Once | INTRAVENOUS | Status: AC
  Administered 2022-04-17: 504 mg via INTRAVENOUS
  Filled 2022-04-17: qty 24

## 2022-04-17 MED ORDER — ACETAMINOPHEN 325 MG PO TABS
650.0000 mg | ORAL_TABLET | Freq: Once | ORAL | Status: AC
  Administered 2022-04-17: 650 mg via ORAL
  Filled 2022-04-17: qty 2

## 2022-04-17 MED ORDER — SODIUM CHLORIDE 0.9% FLUSH
10.0000 mL | Freq: Once | INTRAVENOUS | Status: AC
  Administered 2022-04-17: 10 mL

## 2022-04-17 MED ORDER — SODIUM CHLORIDE 0.9% FLUSH
10.0000 mL | INTRAVENOUS | Status: DC | PRN
  Administered 2022-04-17: 10 mL

## 2022-04-17 MED ORDER — SODIUM CHLORIDE 0.9 % IV SOLN: Freq: Once | INTRAVENOUS | Status: AC

## 2022-04-17 MED ORDER — HEPARIN SOD (PORK) LOCK FLUSH 100 UNIT/ML IV SOLN
500.0000 [IU] | Freq: Once | INTRAVENOUS | Status: AC | PRN
  Administered 2022-04-17: 500 [IU]

## 2022-04-17 MED ORDER — DIPHENHYDRAMINE HCL 25 MG PO CAPS
50.0000 mg | ORAL_CAPSULE | Freq: Once | ORAL | Status: AC
  Administered 2022-04-17: 50 mg via ORAL
  Filled 2022-04-17: qty 2

## 2022-04-17 NOTE — Progress Notes (Signed)
Cancer Center CONSULT NOTE  Patient Care Team: [name], MD as PCP - General (Internal Medicine) Donnelly Angelica, RN as Oncology Nurse Navigator Pershing Proud, RN as Oncology Nurse Navigator Abigail Miyamoto, MD as Consulting Physician (General Surgery) Rachel Moulds, MD as Consulting Physician (Hematology and Oncology) Antony Blackbird, MD as Consulting Physician (Radiation Oncology)  CHIEF COMPLAINTS/PURPOSE OF CONSULTATION:  Newly diagnosed breast cancer  HISTORY OF PRESENTING ILLNESS:   [name] 77 y.o. female is here because of recent diagnosis of right breast cancer  I reviewed her records extensively and collaborated the history with the patient.  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 This is a highly suspicious finding for which ultrasound-guided biopsy is recommended. Adjacent/nearly contiguous complex cystic and solid mass within the RIGHT breast at the 9:30 o'clock axis, 3 cm from the nipple, measuring 1.3 cm. This is a suspicious finding for which ultrasound-guided biopsy is recommended. Highly suspicious calcifications within the inner LEFT breast, with a segmental distribution, measuring 5.7 cm extent. Recommend 2 site stereotactic biopsy to include the more posterior calcifications and the more anterior/retroareolar calcifications.    12/30/2021 Pathology Results   Left breast needle core biopsy from post extent microcalcs showed IDC, overall grade 3.  Prognostics showed ER 100% positive, strong staining intensity, PR 100% positive, strong staining intensity, Ki 67 of 20%. 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  Second area in the right breast needle core biopsy coil clip also showed intermediate grade invasive mammary carcinoma.  Prognostics showed ER 100% positive strong staining PR 15% positive strong staining Ki-67 of 10% and HER2 negative by FISH.  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  She is here for follow up. She is tolerating anastrozole well. No adverse effects reported.  She is also doing really well with the Herceptin.  She reports no adverse effects at all.  During the first infusion she felt really cold but this quickly resolved.  Rest of the pertinent 10 point ROS reviewed and negative  MEDICAL HISTORY:  Past Medical History:  Diagnosis Date   Asthma    Deafness in left ear    WEARS HEARING AID LEFT EAR   GERD (gastroesophageal reflux disease)  Hypertension    Pneumonia    in past x 2    SURGICAL HISTORY: Past Surgical History:  Procedure Laterality Date   ABDOMINAL HYSTERECTOMY     BREAST BIOPSY Left 12/30/2021   MM LT BREAST BX W LOC DEV 1ST LESION IMAGE BX SPEC STEREO GUIDE 12/30/2021 [facility]  MAMMOGRAPHY   BREAST BIOPSY Right 01/02/2022   [place] RT BREAST BX W LOC DEV 1ST LESION IMG BX SPEC US GUIDE 01/02/2022 [facility] MAMMOGRAPHY   BREAST BIOPSY Right 01/02/2022   [place] RT BREAST BX W LOC DEV EA ADD LESION IMG BX SPEC US GUIDE 01/02/2022 [facility] MAMMOGRAPHY   BREAST BIOPSY Left 12/30/2021   MM LT BREAST BX W LOC DEV EA AD LESION IMG BX SPEC STEREO GUIDE 12/30/2021 [facility] MAMMOGRAPHY   CHOLECYSTECTOMY     EYE SURGERY  2016   bilateral cataract surgery   NEPHRECTOMY Left 02/04/2015   Procedure: OPEN RADICAL NEPHRECTOMY;  Surgeon: Heloise Purpura, MD;  Location: [facility];  Service: Urology;  Laterality: Left;   PORTACATH PLACEMENT Left 02/09/2022   Procedure: INSERTION PORT-A-CATH;  Surgeon: Abigail Miyamoto, MD;  Location: [facility] OR;  Service: General;  Laterality: Left;   SENTINEL NODE BIOPSY Bilateral 02/09/2022   Procedure: BILATERAL SENTINEL NODE BIOPSY;  Surgeon: Abigail Miyamoto, MD;  Location: [facility] OR;  Service: General;  Laterality: Bilateral;   TOTAL MASTECTOMY Bilateral 02/09/2022   Procedure: BILATERAL TOTAL MASTECTOMY;  Surgeon: Abigail Miyamoto, MD;  Location: [facility] OR;  Service: General;  Laterality: Bilateral;   TUBAL LIGATION      SOCIAL HISTORY: Social History   Socioeconomic History   Marital status: Married    Spouse name: Not on file   Number of children: Not on file   Years of education: Not on file   Highest education level: Not on file  Occupational History   Not on file  Tobacco Use   Smoking status: Never   Smokeless tobacco: Not on file  Substance and Sexual Activity   Alcohol use: No   Drug use: No   Sexual activity: Not on file  Other Topics Concern   Not on file  Social History Narrative   Not on file   Social Determinants of Health   Financial Resource Strain: Low Risk  (01/30/2022)   Overall Financial Resource Strain (CARDIA)    Difficulty of Paying Living Expenses: Not very hard  Food Insecurity: No Food Insecurity (01/30/2022)   Hunger Vital Sign     Worried About Running Out of Food in the Last Year: Never true    Ran Out of Food in the Last Year: Never true  Transportation Needs: Not on file  Physical Activity: Not on file  Stress: Not on file  Social Connections: Not on file  Intimate Partner Violence: Not on file    FAMILY HISTORY: Family History  Problem Relation Age of Onset   Breast cancer Mother 63 - 48   Colon cancer Mother 81   Breast cancer Sister 51 - 9   Colon cancer Sister 43 - 51   Sleep apnea Brother    Cancer Brother        unknown type, possibly stomach cancer   Thyroid cancer Daughter 4    ALLERGIES:  is allergic to codeine.  MEDICATIONS:  Current Outpatient Medications  Medication Sig Dispense Refill   albuterol (PROVENTIL HFA;VENTOLIN HFA) 108 (90 Base) MCG/ACT inhaler Inhale 1 puff into the lungs every 6 (six) hours as needed for wheezing or shortness of breath.  amLODipine (NORVASC) 10 MG tablet Take 10 mg by mouth daily.     anastrozole (ARIMIDEX) 1 MG tablet Take 1 tablet (1 mg total) by mouth daily. 90 tablet 3   aspirin EC 81 MG tablet Take 81 mg by mouth daily.     Cholecalciferol (VITAMIN D) 50 MCG (2000 UT) CAPS Take 4,000 Units by mouth daily.     fluticasone furoate-vilanterol (BREO ELLIPTA) 100-25 MCG/ACT AEPB Inhale 1 puff into the lungs daily.     irbesartan-hydrochlorothiazide (AVALIDE) 300-12.5 MG tablet Take 1 tablet by mouth daily.      levothyroxine (SYNTHROID) 50 MCG tablet Take 50 mcg by mouth daily before breakfast.     metoprolol (LOPRESSOR) 50 MG tablet Take 50 mg by mouth 2 (two) times daily.      montelukast (SINGULAIR) 10 MG tablet Take 10 mg by mouth at bedtime.      PRESCRIPTION MEDICATION Inject 1 Units as directed once a week. Allergy shot     rosuvastatin (CRESTOR) 5 MG tablet Take 5 mg by mouth daily.     traMADol (ULTRAM) 50 MG tablet Take 1 tablet (50 mg total) by mouth every 6 (six) hours as needed for moderate pain or severe pain. 20 tablet 0   No current  facility-administered medications for this visit.   Facility-Administered Medications Ordered in Other Visits  Medication Dose Route Frequency Provider Last Rate Last Admin   heparin lock flush 100 unit/mL  500 Units Intracatheter Once PRN Shwanda Soltis, MD       sodium chloride flush (NS) 0.9 % injection 10 mL  10 mL Intracatheter PRN Josejulian Tarango, MD       trastuzumab-anns (KANJINTI) 504 mg in sodium chloride 0.9 % 250 mL chemo infusion  6 mg/kg (Order-Specific) Intravenous Once Jenay Morici, Burnice Logan, MD        REVIEW OF SYSTEMS:   Constitutional: Denies fevers, chills or abnormal night sweats Eyes: Denies blurriness of vision, double vision or watery eyes Ears, nose, mouth, throat, and face: Denies mucositis or sore throat Respiratory: Denies cough, dyspnea or wheezes Cardiovascular: Denies palpitation, chest discomfort or lower extremity swelling Gastrointestinal:  Denies nausea, heartburn or change in bowel habits Skin: Denies abnormal skin rashes Lymphatics: Denies new lymphadenopathy or easy bruising Neurological:Denies numbness, tingling or new weaknesses Behavioral/Psych: Mood is stable, no new changes  Breast: Denies any palpable lumps or discharge All other systems were reviewed with the patient and are negative.  PHYSICAL EXAMINATION: ECOG PERFORMANCE STATUS: 0 - Asymptomatic  Physical Exam Constitutional:      Appearance: Normal appearance.  Cardiovascular:     Rate and Rhythm: Normal rate and regular rhythm.     Pulses: Normal pulses.     Heart sounds: Normal heart sounds.  Pulmonary:     Effort: Pulmonary effort is normal.     Breath sounds: Normal breath sounds.  Abdominal:     General: Abdomen is flat.     Palpations: Abdomen is soft.  Musculoskeletal:     Cervical back: Normal range of motion and neck supple. No rigidity.  Lymphadenopathy:     Cervical: No cervical adenopathy.  Skin:    General: Skin is warm and dry.  Neurological:     General: No focal  deficit present.     Mental Status: She is alert.  Psychiatric:        Mood and Affect: Mood normal.      LABORATORY DATA:  I have reviewed the data as listed Lab Results  Component Value  Date   WBC 8.0 04/17/2022   HGB 13.2 04/17/2022   HCT 42.2 04/17/2022   MCV 81.9 04/17/2022   PLT 261 04/17/2022   Lab Results  Component Value Date   NA 137 04/17/2022   K 3.5 04/17/2022   CL 101 04/17/2022   CO2 28 04/17/2022    RADIOGRAPHIC STUDIES: I have personally reviewed the radiological reports and agreed with the findings in the report.  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   She has bilateral breast cancer.  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   Oncotype of 10, no role for adjuvant chemotherapy. She is tolerating anastrozole extremely well.  She will continue anastrozole.  No concerns on review of systems or physical exam.   Invasive ductal carcinoma of left breast (HCC) Left breast biopsy showed invasive ductal carcinoma, high-grade in the anterior calcifications, ER/PR and HER2 amplified.   This lesion measured 3 mm. ITC noted in the left axillary LN. Unfortunately progs cannot be done on ITC. We discussed about considering herceptin alone.  She is tolerating Herceptin extremely well.  She will continue with repeat echocardiogram due end of April, this has been ordered.  No adverse effects reported.  No concerns on physical exam.  She will return to clinic every 6 weeks for follow-up, every 3 weeks for infusion.  Total time spent: 30 min including History, review of records, counseling and coordination of care. All questions were answered.  The patient knows to call the clinic with any problems, questions or concerns.    Rachel MouldsPraveena Aragorn Recker, MD 04/17/22

## 2022-04-17 NOTE — Assessment & Plan Note (Signed)
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   She has bilateral breast cancer.  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   Oncotype of 10, no role for adjuvant chemotherapy. She is tolerating anastrozole extremely well.  She will continue anastrozole.  No concerns on review of systems or physical exam.

## 2022-04-17 NOTE — Assessment & Plan Note (Signed)
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   This lesion measured 3 mm. ITC noted in the left axillary LN. Unfortunately progs cannot be done on ITC. We discussed about considering herceptin alone.  She is tolerating Herceptin extremely well.  She will continue with repeat echocardiogram due end of April, this has been ordered.  No adverse effects reported.  No concerns on physical exam.  She will return to clinic every 6 weeks for follow-up, every 3 weeks for infusion.

## 2022-04-17 NOTE — Patient Instructions (Signed)
[facility]  Discharge Instructions: Thank you for choosing [facility] to provide your oncology and hematology care.   If you have a lab appointment with the Cancer Center, please go directly to the Cancer Center and check in at the registration area.   Wear comfortable clothing and clothing appropriate for easy access to any Portacath or PICC line.   We strive to give you quality time with your provider. You may need to reschedule your appointment if you arrive late (15 or more minutes).  Arriving late affects you and other patients whose appointments are after yours.  Also, if you miss three or more appointments without notifying the office, you may be dismissed from the clinic at the provider's discretion.      For prescription refill requests, have your pharmacy contact our office and allow 72 hours for refills to be completed.    Today you received the following chemotherapy and/or immunotherapy agents: Kanjinti.      To help prevent nausea and vomiting after your treatment, we encourage you to take your nausea medication as directed.  BELOW ARE SYMPTOMS THAT SHOULD BE REPORTED IMMEDIATELY: *FEVER GREATER THAN 100.4 F (38 C) OR HIGHER *CHILLS OR SWEATING *NAUSEA AND VOMITING THAT IS NOT CONTROLLED WITH YOUR NAUSEA MEDICATION *UNUSUAL SHORTNESS OF BREATH *UNUSUAL BRUISING OR BLEEDING *URINARY PROBLEMS (pain or burning when urinating, or frequent urination) *BOWEL PROBLEMS (unusual diarrhea, constipation, pain near the anus) TENDERNESS IN MOUTH AND THROAT WITH OR WITHOUT PRESENCE OF ULCERS (sore throat, sores in mouth, or a toothache) UNUSUAL RASH, SWELLING OR PAIN  UNUSUAL VAGINAL DISCHARGE OR ITCHING   Items with * indicate a potential emergency and should be followed up as soon as possible or go to the Emergency Department if any problems should occur.  Please show the CHEMOTHERAPY ALERT CARD or IMMUNOTHERAPY ALERT CARD at  check-in to the Emergency Department and triage nurse.  Should you have questions after your visit or need to cancel or reschedule your appointment, please contact [facility]  Dept: [phone]  and follow the prompts.  Office hours are 8:00 a.m. to 4:30 p.m. Monday - Friday. Please note that voicemails left after 4:00 p.m. may not be returned until the following business day.  We are closed weekends and major holidays. You have access to a nurse at all times for urgent questions. Please call the main number to the clinic Dept: [phone] and follow the prompts.   For any non-urgent questions, you may also contact your provider using MyChart. We now offer e-Visits for anyone 18 and older to request care online for non-urgent symptoms. For details visit [email or web address].   Also download the MyChart app! Go to the app store, search "MyChart", open the app, select [place], and log in with your MyChart username and password.   

## 2022-04-18 ENCOUNTER — Other Ambulatory Visit: Payer: Self-pay

## 2022-04-21 ENCOUNTER — Ambulatory Visit: Payer: Medicare HMO | Admitting: Neurology

## 2022-04-21 ENCOUNTER — Encounter: Payer: Self-pay | Admitting: Neurology

## 2022-04-21 ENCOUNTER — Other Ambulatory Visit: Payer: Self-pay

## 2022-04-21 VITALS — BP 160/66 | HR 66 | Ht 60.75 in | Wt 190.5 lb

## 2022-04-21 DIAGNOSIS — G4733 Obstructive sleep apnea (adult) (pediatric): Secondary | ICD-10-CM | POA: Diagnosis not present

## 2022-04-21 DIAGNOSIS — G4719 Other hypersomnia: Secondary | ICD-10-CM | POA: Diagnosis not present

## 2022-04-21 NOTE — Progress Notes (Addendum)
Patient: [name] Date of Birth: [birth year]  Reason for Visit: Follow up History from: Patient Primary Neurologist: [name]  ASSESSMENT AND PLAN 77 y.o. year old female   1.  OSA on CPAP  She has superb compliance.  I am going to increase her pressure range from 5-12 to 5-15.  Her current AHI is 8.8.  She has noted improved sleep quality.  Continues with daytime fatigue, potentially related to undergoing treatment for breast cancer.  I will pull a CPAP download in 4 weeks to review the data.  May consider ONO since oxygen desaturations were seen on PSG.  Follow-up in 6 months or sooner if needed.  Addendum 04/18/22: Received note from PCP that her Fitbit was reporting poor sleep habits and questions hypoxia?  Will go ahead and order ONO today  HISTORY OF PRESENT ILLNESS: Today 04/21/22 Here today for initial CPAP visit.  Presented with snoring and daytime somnolence.  Had PSG 02/03/2022 showing mild OSA with at times severe desaturations as low as 79%.  Recommended AutoPap. Is on immunotherapy for breast cancer. Wearing full face mask. Hard to adjust to having mask on her face. Has never even liked wearing her glasses. She does feel like her sleep is better. Not much change to her energy level. In January had double mastectomy, still fatigued from that. Is a mouth breather. Has not felt that she needs more air. Doesn't recall every waking up feeling need more air.  ESS 8  Review of CPAP data 03/21/2022-04/18/2428/30 days at 100%, 100% greater than 4-hour usage.  Average usage 8 hours 7 minutes.  Minimum pressure 5, maximum pressure 12 cm water.  Pressure 95th percentile 11.9, maximum 12.0.  Leak 6.2, AHI 8.8.  HISTORY  10/01/21 Dr. Frances Shea: I saw your patient, [name], kind request in the sleep clinic today for initial consultation of her sleep disorder, in particular, concern for underlying obstructive sleep apnea.  The patient is unaccompanied today.  As you know, [name] is a  77 year old right-handed woman with an underlying medical history of left ear hearing loss, hypertension, reflux disease, asthma, allergies, degenerative joint disease and degenerative disc disease, low back pain, history of chest pain, and obesity, who reports snoring and excessive daytime somnolence.  I reviewed your office note from 09/18/2021.  Her Epworth sleepiness score is 11 out of 24, fatigue severity score is 44 out of 63.  She lives with her husband.  She has 2 grown children, 2 grandchildren and 2 step great-grandchildren.  She is a non-smoker and drinks caffeine in the form of coffee, about 2 cups/day and occasional tea, no alcohol currently.  She does watch TV in her bedroom until her husband turns it off.  She is generally in bed by 830 and watches TV for at least an hour or so.  Rise time is around 7 but she often wakes up between 3 and 5 AM and then goes to the sofa to not disturb her husband.  They have a dog in the household, the dog sleeps on a his bed on the floor.  She is a retired Geneticist, molecular.  Her weight has been more or less stable.  Her brother has sleep apnea.  She has nocturia about once or twice per average night and has woken up occasionally with a headache which she attributes to sinus pressure.  She does have allergies and gets allergy shots once a week.  She has asthma and uses Breo inhaler and she also takes  montelukast.  She has a rescue inhaler as well.  Occasionally she will take an over-the-counter PM type medication at night.   REVIEW OF SYSTEMS: Out of a complete 14 system review of symptoms, the patient complains only of the following symptoms, and all other reviewed systems are negative.  See HPI  ALLERGIES: Allergies  Allergen Reactions   Codeine Nausea Only and Rash    HOME MEDICATIONS: Outpatient Medications Prior to Visit  Medication Sig Dispense Refill   albuterol (PROVENTIL HFA;VENTOLIN HFA) 108 (90 Base) MCG/ACT inhaler Inhale 1 puff into the lungs every  6 (six) hours as needed for wheezing or shortness of breath.     amLODipine (NORVASC) 10 MG tablet Take 10 mg by mouth daily.     anastrozole (ARIMIDEX) 1 MG tablet Take 1 tablet (1 mg total) by mouth daily. 90 tablet 3   aspirin EC 81 MG tablet Take 81 mg by mouth daily.     Cholecalciferol (VITAMIN D) 50 MCG (2000 UT) CAPS Take 4,000 Units by mouth daily.     fluticasone furoate-vilanterol (BREO ELLIPTA) 100-25 MCG/ACT AEPB Inhale 1 puff into the lungs daily.     irbesartan-hydrochlorothiazide (AVALIDE) 300-12.5 MG tablet Take 1 tablet by mouth daily.      levothyroxine (SYNTHROID) 50 MCG tablet Take 50 mcg by mouth daily before breakfast.     metoprolol (LOPRESSOR) 50 MG tablet Take 50 mg by mouth 2 (two) times daily.      montelukast (SINGULAIR) 10 MG tablet Take 10 mg by mouth at bedtime.      PRESCRIPTION MEDICATION Inject 1 Units as directed once a week. Allergy shot     rosuvastatin (CRESTOR) 5 MG tablet Take 5 mg by mouth daily.     traMADol (ULTRAM) 50 MG tablet Take 1 tablet (50 mg total) by mouth every 6 (six) hours as needed for moderate pain or severe pain. 20 tablet 0   No facility-administered medications prior to visit.    PAST MEDICAL HISTORY: Past Medical History:  Diagnosis Date   Asthma    Deafness in left ear    WEARS HEARING AID LEFT EAR   GERD (gastroesophageal reflux disease)    Hypertension    Pneumonia    in past x 2    PAST SURGICAL HISTORY: Past Surgical History:  Procedure Laterality Date   ABDOMINAL HYSTERECTOMY     BREAST BIOPSY Left 12/30/2021   MM LT BREAST BX W LOC DEV 1ST LESION IMAGE BX SPEC STEREO GUIDE 12/30/2021 [facility]   BREAST BIOPSY Right 01/02/2022   [place] RT BREAST BX W LOC DEV 1ST LESION IMG BX SPEC US GUIDE 01/02/2022 [facility]   BREAST BIOPSY Right 01/02/2022   [place] RT BREAST BX W LOC DEV EA ADD LESION IMG BX SPEC US GUIDE 01/02/2022 [facility]   BREAST BIOPSY Left 12/30/2021   MM LT BREAST BX W LOC DEV EA  AD LESION IMG BX SPEC STEREO GUIDE 12/30/2021 [facility]   CHOLECYSTECTOMY     EYE SURGERY  2016   bilateral cataract surgery   NEPHRECTOMY Left 02/04/2015   Procedure: OPEN RADICAL NEPHRECTOMY;  Surgeon: Heloise Purpura, MD;  Location: [facility];  Service: Urology;  Laterality: Left;   PORTACATH PLACEMENT Left 02/09/2022   Procedure: INSERTION PORT-A-CATH;  Surgeon: Abigail Miyamoto, MD;  Location[facility];  Service: General;  Laterality: Left;   SENTINEL NODE BIOPSY Bilateral 02/09/2022   Procedure: BILATERAL SENTINEL NODE BIOPSY;  Surgeon: Abigail Miyamoto, MD;  Location: [facility] OR;  Service: General;  Laterality: Bilateral;   TOTAL MASTECTOMY Bilateral 02/09/2022   Procedure: BILATERAL TOTAL MASTECTOMY;  Surgeon: Abigail Miyamoto, MD;  Location: [facility] OR;  Service: General;  Laterality: Bilateral;   TUBAL LIGATION      FAMILY HISTORY: Family History  Problem Relation Age of Onset   Breast cancer Mother 78 - 58   Colon cancer Mother 48   Breast cancer Sister 54 - 48   Colon cancer Sister 37 - 69   Sleep apnea Brother    Cancer Brother        unknown type, possibly stomach cancer   Thyroid cancer Daughter 78    SOCIAL HISTORY: Social History   Socioeconomic History   Marital status: Married    Spouse name: Not on file   Number of children: 2   Years of education: Not on file   Highest education level: Not on file  Occupational History   Not on file  Tobacco Use   Smoking status: Never   Smokeless tobacco: Not on file  Substance and Sexual Activity   Alcohol use: No   Drug use: No   Sexual activity: Not on file  Other Topics Concern   Not on file  Social History Narrative   Not on file   Social Determinants of Health   Financial Resource Strain: Low Risk  (01/30/2022)   Overall Financial Resource Strain (CARDIA)    Difficulty of Paying Living Expenses: Not very hard  Food Insecurity: No Food Insecurity (01/30/2022)   Hunger Vital Sign    Worried About Running Out of  Food in the Last Year: Never true    Ran Out of Food in the Last Year: Never true  Transportation Needs: Not on file  Physical Activity: Not on file  Stress: Not on file  Social Connections: Not on file  Intimate Partner Violence: Not on file   PHYSICAL EXAM  Vitals:   04/21/22 0958 04/21/22 1002  BP: (!) 185/74 (!) 160/66  Pulse: 72 66  Weight: 190 lb 8 oz (86.4 kg)   Height: 5' 0.75" (1.543 m)    Body mass index is 36.29 kg/m.  Generalized: Well developed, in no acute distress  Neurological examination  Mentation: Alert oriented to time, place, history taking. Follows all commands speech and language fluent Cranial nerve II-XII: Pupils were equal round reactive to light. Extraocular movements were full, visual field were full on confrontational test. Facial sensation and strength were normal.  Head turning and shoulder shrug  were normal and symmetric. Motor: The motor testing reveals 5 over 5 strength of all 4 extremities. Good symmetric motor tone is noted throughout.  Sensory: Sensory testing is intact to soft touch on all 4 extremities. No evidence of extinction is noted.  Coordination: Cerebellar testing reveals good finger-nose-finger bilaterally Gait and station: Gait is normal. Reflexes: Deep tendon reflexes are symmetric and normal bilaterally.   DIAGNOSTIC DATA (LABS, IMAGING, TESTING) - I reviewed patient records, labs, notes, testing and imaging myself where available.  Lab Results  Component Value Date   WBC 8.0 04/17/2022   HGB 13.2 04/17/2022   HCT 42.2 04/17/2022   MCV 81.9 04/17/2022   PLT 261 04/17/2022      Component Value Date/Time   NA 137 04/17/2022 0846   K 3.5 04/17/2022 0846   CL 101 04/17/2022 0846   CO2 28 04/17/2022 0846   GLUCOSE 114 (H) 04/17/2022 0846   BUN 16 04/17/2022 0846   CREATININE 0.77 04/17/2022 0846  CALCIUM 9.0 04/17/2022 0846   PROT 7.8 04/17/2022 0846   ALBUMIN 3.8 04/17/2022 0846   AST 27 04/17/2022 0846   ALT 30  04/17/2022 0846   ALKPHOS 98 04/17/2022 0846   BILITOT 0.6 04/17/2022 0846   GFRNONAA >60 04/17/2022 0846   GFRAA >60 02/07/2015 0439   No results found for: "CHOL", "HDL", "LDLCALC", "LDLDIRECT", "TRIG", "CHOLHDL" No results found for: "HGBA1C" No results found for: "VITAMINB12" No results found for: "TSH"  Margie EgeSarah Lincon Sahlin, AGNP-C, DNP 04/21/2022, 11:12 AM [facility] [address], [address] [place], [place] [address] [phone]

## 2022-04-21 NOTE — Addendum Note (Signed)
Addended by: Glean Salvo on: 04/21/2022 02:04 PM   Modules accepted: Orders

## 2022-04-21 NOTE — Patient Instructions (Signed)
Continue nightly CPAP use I will increase your pressure, will follow up in 1 month with data

## 2022-04-22 ENCOUNTER — Other Ambulatory Visit: Payer: Self-pay

## 2022-04-23 ENCOUNTER — Encounter: Payer: Self-pay | Admitting: *Deleted

## 2022-04-26 DIAGNOSIS — I10 Essential (primary) hypertension: Secondary | ICD-10-CM | POA: Diagnosis not present

## 2022-04-26 DIAGNOSIS — G4733 Obstructive sleep apnea (adult) (pediatric): Secondary | ICD-10-CM | POA: Diagnosis not present

## 2022-05-04 ENCOUNTER — Ambulatory Visit (HOSPITAL_COMMUNITY)
Admission: RE | Admit: 2022-05-04 | Discharge: 2022-05-04 | Disposition: A | Discharge status: Home / Self Care | Payer: Medicare HMO | Source: Ambulatory Visit | Attending: Hematology and Oncology | Admitting: Hematology and Oncology

## 2022-05-04 DIAGNOSIS — Z0189 Encounter for other specified special examinations: Secondary | ICD-10-CM

## 2022-05-04 DIAGNOSIS — I10 Essential (primary) hypertension: Secondary | ICD-10-CM | POA: Insufficient documentation

## 2022-05-04 DIAGNOSIS — C50912 Malignant neoplasm of unspecified site of left female breast: Secondary | ICD-10-CM | POA: Insufficient documentation

## 2022-05-04 DIAGNOSIS — C50911 Malignant neoplasm of unspecified site of right female breast: Secondary | ICD-10-CM | POA: Insufficient documentation

## 2022-05-04 LAB — ECHOCARDIOGRAM COMPLETE
Area-P 1/2: 4.77 cm2
MV M vel: 3.41 m/s
MV Peak grad: 46.5 mmHg
MV VTI: 3.76 cm2
S' Lateral: 2.7 cm

## 2022-05-04 MED ORDER — PERFLUTREN LIPID MICROSPHERE
1.0000 mL | INTRAVENOUS | Status: AC | PRN
  Administered 2022-05-04: 2 mL via INTRAVENOUS

## 2022-05-04 NOTE — Progress Notes (Signed)
  Echocardiogram 2D Echocardiogram has been performed.  [name] 05/04/2022, 10:05 AM

## 2022-05-05 ENCOUNTER — Encounter: Payer: Self-pay | Admitting: Adult Health

## 2022-05-05 ENCOUNTER — Inpatient Hospital Stay: Payer: Medicare HMO | Admitting: Adult Health

## 2022-05-05 ENCOUNTER — Encounter: Payer: Self-pay | Admitting: Hematology and Oncology

## 2022-05-05 ENCOUNTER — Inpatient Hospital Stay: Payer: Medicare HMO

## 2022-05-05 VITALS — BP 152/59 | HR 56 | Temp 97.9°F | Resp 18

## 2022-05-05 VITALS — BP 153/56 | HR 62 | Temp 97.7°F | Resp 18 | Ht 60.75 in | Wt 190.2 lb

## 2022-05-05 DIAGNOSIS — C50912 Malignant neoplasm of unspecified site of left female breast: Secondary | ICD-10-CM

## 2022-05-05 DIAGNOSIS — C50911 Malignant neoplasm of unspecified site of right female breast: Secondary | ICD-10-CM | POA: Diagnosis not present

## 2022-05-05 DIAGNOSIS — Z170 Estrogen receptor positive status [ER+]: Secondary | ICD-10-CM | POA: Diagnosis not present

## 2022-05-05 DIAGNOSIS — Z9013 Acquired absence of bilateral breasts and nipples: Secondary | ICD-10-CM | POA: Diagnosis not present

## 2022-05-05 DIAGNOSIS — Z79899 Other long term (current) drug therapy: Secondary | ICD-10-CM | POA: Diagnosis not present

## 2022-05-05 DIAGNOSIS — Z5112 Encounter for antineoplastic immunotherapy: Secondary | ICD-10-CM | POA: Diagnosis not present

## 2022-05-05 DIAGNOSIS — Z79811 Long term (current) use of aromatase inhibitors: Secondary | ICD-10-CM | POA: Diagnosis not present

## 2022-05-05 DIAGNOSIS — C50411 Malignant neoplasm of upper-outer quadrant of right female breast: Secondary | ICD-10-CM | POA: Diagnosis not present

## 2022-05-05 MED ORDER — SODIUM CHLORIDE 0.9% FLUSH
10.0000 mL | INTRAVENOUS | Status: DC | PRN
  Administered 2022-05-05: 10 mL

## 2022-05-05 MED ORDER — DIPHENHYDRAMINE HCL 25 MG PO CAPS
50.0000 mg | ORAL_CAPSULE | Freq: Once | ORAL | Status: AC
  Administered 2022-05-05: 50 mg via ORAL
  Filled 2022-05-05: qty 2

## 2022-05-05 MED ORDER — SODIUM CHLORIDE 0.9 % IV SOLN: Freq: Once | INTRAVENOUS | Status: AC

## 2022-05-05 MED ORDER — HEPARIN SOD (PORK) LOCK FLUSH 100 UNIT/ML IV SOLN
500.0000 [IU] | Freq: Once | INTRAVENOUS | Status: AC | PRN
  Administered 2022-05-05: 500 [IU]

## 2022-05-05 MED ORDER — TRASTUZUMAB-ANNS CHEMO 150 MG IV SOLR
6.0000 mg/kg | Freq: Once | INTRAVENOUS | Status: AC
  Administered 2022-05-05: 504 mg via INTRAVENOUS
  Filled 2022-05-05: qty 24

## 2022-05-05 MED ORDER — ACETAMINOPHEN 325 MG PO TABS
650.0000 mg | ORAL_TABLET | Freq: Once | ORAL | Status: AC
  Administered 2022-05-05: 650 mg via ORAL
  Filled 2022-05-05: qty 2

## 2022-05-05 NOTE — Patient Instructions (Signed)
[facility]  Discharge Instructions: Thank you for choosing [facility] to provide your oncology and hematology care.   If you have a lab appointment with the Cancer Center, please go directly to the Cancer Center and check in at the registration area.   Wear comfortable clothing and clothing appropriate for easy access to any Portacath or PICC line.   We strive to give you quality time with your provider. You may need to reschedule your appointment if you arrive late (15 or more minutes).  Arriving late affects you and other patients whose appointments are after yours.  Also, if you miss three or more appointments without notifying the office, you may be dismissed from the clinic at the provider's discretion.      For prescription refill requests, have your pharmacy contact our office and allow 72 hours for refills to be completed.    Today you received the following chemotherapy and/or immunotherapy agents: trastuzumab       To help prevent nausea and vomiting after your treatment, we encourage you to take your nausea medication as directed.  BELOW ARE SYMPTOMS THAT SHOULD BE REPORTED IMMEDIATELY: *FEVER GREATER THAN 100.4 F (38 C) OR HIGHER *CHILLS OR SWEATING *NAUSEA AND VOMITING THAT IS NOT CONTROLLED WITH YOUR NAUSEA MEDICATION *UNUSUAL SHORTNESS OF BREATH *UNUSUAL BRUISING OR BLEEDING *URINARY PROBLEMS (pain or burning when urinating, or frequent urination) *BOWEL PROBLEMS (unusual diarrhea, constipation, pain near the anus) TENDERNESS IN MOUTH AND THROAT WITH OR WITHOUT PRESENCE OF ULCERS (sore throat, sores in mouth, or a toothache) UNUSUAL RASH, SWELLING OR PAIN  UNUSUAL VAGINAL DISCHARGE OR ITCHING   Items with * indicate a potential emergency and should be followed up as soon as possible or go to the Emergency Department if any problems should occur.  Please show the CHEMOTHERAPY ALERT CARD or IMMUNOTHERAPY ALERT CARD at  check-in to the Emergency Department and triage nurse.  Should you have questions after your visit or need to cancel or reschedule your appointment, please contact [facility]  Dept: [phone]  and follow the prompts.  Office hours are 8:00 a.m. to 4:30 p.m. Monday - Friday. Please note that voicemails left after 4:00 p.m. may not be returned until the following business day.  We are closed weekends and major holidays. You have access to a nurse at all times for urgent questions. Please call the main number to the clinic Dept: [phone] and follow the prompts.   For any non-urgent questions, you may also contact your provider using MyChart. We now offer e-Visits for anyone 18 and older to request care online for non-urgent symptoms. For details visit [email or web address].   Also download the MyChart app! Go to the app store, search "MyChart", open the app, select [facility], and log in with your MyChart username and password.   

## 2022-05-05 NOTE — Progress Notes (Unsigned)
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 This is a highly suspicious finding for which ultrasound-guided biopsy is recommended. Adjacent/nearly contiguous complex cystic and solid mass within the RIGHT breast at the 9:30 o'clock axis, 3 cm from the nipple, measuring 1.3 cm. This is a suspicious finding for which ultrasound-guided biopsy is recommended. Highly suspicious calcifications within the inner LEFT breast, with a segmental distribution, measuring 5.7 cm extent. Recommend 2 site stereotactic biopsy to include the more posterior calcifications and the more anterior/retroareolar calcifications.    12/30/2021 Pathology Results   Left breast needle core biopsy from post extent microcalcs showed IDC, overall grade 3.  Prognostics showed ER 100% positive, strong staining intensity, PR 100% positive, strong staining intensity, Ki 67 of 20%. 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  Second area in the right breast needle core biopsy coil clip also showed intermediate grade invasive mammary carcinoma.  Prognostics showed ER 100% positive strong staining PR 15% positive strong staining Ki-67 of 10% and HER2 negative by FISH.  Coil clip prognostic showed ER 95% positive strong staining PR 95% positive strong staining Ki-67 of 30% and HER2 negative by [facility]   02/09/2022 Surgery   Bilateral mastectomies Right breast: ILC, intermediate grade, 1.5cm, LCIS, DCIS, 1LN identifed in breast tissue and negative, T1c, N0   02/09/2022 Cancer Staging   Staging form: Breast, AJCC 8th Edition - Pathologic stage from 02/09/2022: Stage IA (pT1c, pN0, cM0, G2, ER+, PR+, HER2-) - Signed by Wendy Socks, NP on 05/05/2022 Stage prefix: Initial diagnosis Histologic grading system: 3 grade system   03/27/2022 -  Chemotherapy   Patient is on Treatment Plan : BREAST MAINTENANCE Trastuzumab IV (6) or SQ (600) D1 q21d x 13 cycles     03/2022 -  Anti-estrogen oral therapy   Anastrozole   Invasive ductal carcinoma of left breast  02/09/2022 Surgery   Bilateral Mastectomies: Left breast mastectomy: IDC, g2, 0.3cm, DCIS, ILC intermediate grade, 0.6cm, LCIS, 1SLN + for ITC, 4 SLN negative for malignancy   02/09/2022 Cancer Staging   Staging form: Breast, AJCC 8th Edition - Pathologic stage from 02/09/2022: Stage IA (pT1b, pN0(i+), cM0, G2, ER+, PR+, HER2+) - Signed by Wendy Socks, NP on 05/05/2022 Histologic grading system: 3 grade system   03/27/2022 -  Chemotherapy   Patient  is on Treatment Plan : BREAST MAINTENANCE Trastuzumab IV (6) or SQ (600) D1 q21d x 13 cycles     03/2022 -  Anti-estrogen oral therapy   Anastrozole     CURRENT  THERAPY: Herceptin and anastrozole  INTERVAL HISTORY: [name] 77 y.o. female returns for follow-up of her bilateral breast cancer she is currently on treatment with Herceptin every 3 weeks.  Her most recent echocardiogram occurred May 04, 2022 demonstrating a left ventricular ejection fraction of 60 to 65%.  She is taking anastrozole daily with good tolerance.  She normally undergoes bone density testing with her PCP but says it has been several years since having this.    She is experiencing mild hot flashes, arthralgias and fatigue but these are manageable for her.    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  MEDICAL HISTORY: Past Medical History:  Diagnosis Date   Asthma    Deafness in left ear    WEARS HEARING AID LEFT EAR   GERD (gastroesophageal reflux disease)    Hypertension    Pneumonia    in past x 2    SURGICAL HISTORY: Past Surgical History:  Procedure Laterality Date   ABDOMINAL HYSTERECTOMY     BREAST BIOPSY Left 12/30/2021   MM LT BREAST BX W LOC DEV 1ST LESION IMAGE BX SPEC STEREO GUIDE 12/30/2021 [facility]   BREAST BIOPSY Right 01/02/2022   [place] RT BREAST BX W LOC DEV 1ST LESION IMG BX SPEC US GUIDE 01/02/2022 [facility]   BREAST BIOPSY Right 01/02/2022   [place] RT BREAST BX W LOC DEV EA ADD LESION IMG BX SPEC US GUIDE 01/02/2022 [facility]   BREAST BIOPSY Left 12/30/2021   MM LT BREAST BX W LOC DEV EA AD LESION IMG BX SPEC STEREO GUIDE 12/30/2021 [facility]   CHOLECYSTECTOMY     EYE SURGERY  2016   bilateral cataract surgery   NEPHRECTOMY Left 02/04/2015   Procedure: OPEN RADICAL NEPHRECTOMY;   Surgeon: Wendy Purpura, MD;  Location: [facility];  Service: Urology;  Laterality: Left;   PORTACATH PLACEMENT Left 02/09/2022   Procedure: INSERTION PORT-A-CATH;  Surgeon: Wendy Miyamoto, MD;  Location: [facility];  Service: General;  Laterality: Left;   SENTINEL NODE BIOPSY Bilateral 02/09/2022   Procedure: BILATERAL SENTINEL NODE BIOPSY;  Surgeon: Wendy Miyamoto, MD;  Location: [facility];  Service: General;  Laterality: Bilateral;   TOTAL MASTECTOMY Bilateral 02/09/2022   Procedure: BILATERAL TOTAL MASTECTOMY;  Surgeon: Wendy Miyamoto, MD;  Location: [facility];  Service: General;  Laterality: Bilateral;   TUBAL LIGATION      SOCIAL HISTORY: Social History   Socioeconomic History   Marital status: Married    Spouse name: Not on file   Number of children: 2   Years of education: Not on file   Highest education level: Not on file  Occupational History   Not on file  Tobacco Use   Smoking status: Never   Smokeless tobacco: Not on file  Substance and Sexual Activity   Alcohol use: No   Drug use: No   Sexual activity: Not on file  Other Topics Concern   Not on  file  Social History Narrative   Not on file   Social Determinants of Health   Financial Resource Strain: Low Risk  (01/30/2022)   Overall Financial Resource Strain (CARDIA)    Difficulty of Paying Living Expenses: Not very hard  Food Insecurity: No Food Insecurity (01/30/2022)   Hunger Vital Sign    Worried About Running Out of Food in the Last Year: Never true    Ran Out of Food in the Last Year: Never true  Transportation Needs: Not on file  Physical Activity: Not on file  Stress: Not on file  Social Connections: Not on file  Intimate Partner Violence: Not on file    FAMILY HISTORY: Family History  Problem Relation Age of Onset   Breast cancer Mother 70 - 31   Colon cancer Mother 28   Breast cancer Sister 30 - 62   Colon cancer Sister 7 - 92   Sleep apnea Brother    Cancer Brother        unknown type, possibly  stomach cancer   Thyroid cancer Daughter 71    Review of Systems  Constitutional:  Positive for fatigue. Negative for appetite change, chills, fever and unexpected weight change.  HENT:   Negative for hearing loss, lump/mass and trouble swallowing.   Eyes:  Negative for eye problems and icterus.  Respiratory:  Negative for chest tightness, cough and shortness of breath.   Cardiovascular:  Negative for chest pain, leg swelling and palpitations.  Gastrointestinal:  Negative for abdominal distention, abdominal pain, constipation, diarrhea, nausea and vomiting.  Endocrine: Positive for hot flashes.  Genitourinary:  Negative for difficulty urinating.   Musculoskeletal:  Positive for arthralgias.  Skin:  Negative for itching and rash.  Neurological:  Negative for dizziness, extremity weakness, headaches and numbness.  Hematological:  Negative for adenopathy. Does not bruise/bleed easily.  Psychiatric/Behavioral:  Negative for depression. The patient is not nervous/anxious.       PHYSICAL EXAMINATION    Vitals:   05/05/22 1212  BP: (!) 153/56  Pulse: 62  Resp: 18  Temp: 97.7 F (36.5 C)  SpO2: 97%    Physical Exam Constitutional:      General: She is not in acute distress.    Appearance: Normal appearance. She is not toxic-appearing.  HENT:     Head: Normocephalic and atraumatic.  Eyes:     General: No scleral icterus. Cardiovascular:     Rate and Rhythm: Normal rate and regular rhythm.     Pulses: Normal pulses.     Heart sounds: Normal heart sounds.  Pulmonary:     Effort: Pulmonary effort is normal.     Breath sounds: Normal breath sounds.  Abdominal:     General: Abdomen is flat. Bowel sounds are normal. There is no distension.     Palpations: Abdomen is soft.     Tenderness: There is no abdominal tenderness.  Musculoskeletal:        General: No swelling.     Cervical back: Neck supple.  Lymphadenopathy:     Cervical: No cervical adenopathy.  Skin:    General:  Skin is warm and dry.     Findings: No rash.  Neurological:     General: No focal deficit present.     Mental Status: She is alert.  Psychiatric:        Mood and Affect: Mood normal.        Behavior: Behavior normal.     LABORATORY DATA: None today  ASSESSMENT and THERAPY  PLAN:   Bilateral breast cancer [name] is a 77 year old woman with bilateral breast cancer, stage IA, ER/PR positive bilaterally and HER2+ on the left.  She is here for f/u and evaluation while receiving adjuvant treatment with Herceptin and Anastrozole.  Treatment Plan:  Bilateral mastectomies and sentinel node biopsy Adjuvant Herceptin x 1 year Adjuvant antiestrogen therapy 5-7 years  Herceptin Toxicities: At risk for heart failure: Echo every 3 months, most recently 05/04/2022 with normal EF  Anastrozole Toxicities:  Fatigue/hot flashes/arthralgias: manageable and tolerable for patient will monitor Bone health: DEXA recommended to be completed with her PCP as she has had this previously with their office.    [name] will continue on her current treatment.  She has no clinical signs of breast cancer recurrence and is tolerating it well.  RTC in 3 weeks for f/u and her next Herceptin.    All questions were answered. The patient knows to call the clinic with any problems, questions or concerns. We can certainly see the patient much sooner if necessary.  Total encounter time:30 minutes*in face-to-face visit time, chart review, lab review, care coordination, order entry, and documentation of the encounter time.   Lillard Anes, NP 05/06/22 12:49 PM Medical Oncology and Hematology [facility] [address] [place], [place] [address] Tel. [phone]    Fax. [phone]  *Total Encounter Time as defined by the Centers for Medicare and Medicaid Services includes, in addition to the face-to-face time of a patient visit (documented in the note above) non-face-to-face time: obtaining and  reviewing outside history, ordering and reviewing medications, tests or procedures, care coordination (communications with other health care professionals or caregivers) and documentation in the medical record.

## 2022-05-06 ENCOUNTER — Encounter: Payer: Self-pay | Admitting: Hematology and Oncology

## 2022-05-06 NOTE — Assessment & Plan Note (Addendum)
[name] is a 77 year old woman with bilateral breast cancer, stage IA, ER/PR positive bilaterally and HER2+ on the left.  She is here for f/u and evaluation while receiving adjuvant treatment with Herceptin and Anastrozole.  Treatment Plan:  Bilateral mastectomies and sentinel node biopsy Adjuvant Herceptin x 1 year Adjuvant antiestrogen therapy 5-7 years  Herceptin Toxicities: At risk for heart failure: Echo every 3 months, most recently 05/04/2022 with normal EF  Anastrozole Toxicities:  Fatigue/hot flashes/arthralgias: manageable and tolerable for patient will monitor Bone health: DEXA recommended to be completed with her PCP as she has had this previously with their office.    Shiann will continue on her current treatment.  She has no clinical signs of breast cancer recurrence and is tolerating it well.  RTC in 3 weeks for f/u and her next Herceptin.

## 2022-05-12 ENCOUNTER — Other Ambulatory Visit: Payer: Self-pay | Admitting: *Deleted

## 2022-05-13 ENCOUNTER — Encounter: Payer: Self-pay | Admitting: Hematology and Oncology

## 2022-05-25 ENCOUNTER — Encounter: Payer: Self-pay | Admitting: Neurology

## 2022-05-25 DIAGNOSIS — G4719 Other hypersomnia: Secondary | ICD-10-CM

## 2022-05-26 ENCOUNTER — Encounter: Payer: Self-pay | Admitting: *Deleted

## 2022-05-26 ENCOUNTER — Telehealth: Payer: Self-pay

## 2022-05-26 ENCOUNTER — Other Ambulatory Visit: Payer: Self-pay | Admitting: *Deleted

## 2022-05-26 ENCOUNTER — Other Ambulatory Visit: Payer: Self-pay

## 2022-05-26 ENCOUNTER — Ambulatory Visit: Payer: Medicare HMO | Admitting: Physician Assistant

## 2022-05-26 ENCOUNTER — Encounter: Payer: Self-pay | Admitting: Adult Health

## 2022-05-26 ENCOUNTER — Inpatient Hospital Stay: Payer: Medicare HMO | Attending: Hematology and Oncology

## 2022-05-26 ENCOUNTER — Inpatient Hospital Stay (HOSPITAL_BASED_OUTPATIENT_CLINIC_OR_DEPARTMENT_OTHER): Payer: Medicare HMO | Admitting: Adult Health

## 2022-05-26 VITALS — BP 143/59 | HR 55 | Resp 18

## 2022-05-26 DIAGNOSIS — C50912 Malignant neoplasm of unspecified site of left female breast: Secondary | ICD-10-CM

## 2022-05-26 DIAGNOSIS — Z170 Estrogen receptor positive status [ER+]: Secondary | ICD-10-CM | POA: Diagnosis not present

## 2022-05-26 DIAGNOSIS — C50911 Malignant neoplasm of unspecified site of right female breast: Secondary | ICD-10-CM

## 2022-05-26 DIAGNOSIS — C50411 Malignant neoplasm of upper-outer quadrant of right female breast: Secondary | ICD-10-CM | POA: Diagnosis present

## 2022-05-26 DIAGNOSIS — Z5112 Encounter for antineoplastic immunotherapy: Secondary | ICD-10-CM | POA: Diagnosis present

## 2022-05-26 DIAGNOSIS — I10 Essential (primary) hypertension: Secondary | ICD-10-CM | POA: Diagnosis not present

## 2022-05-26 DIAGNOSIS — Z95828 Presence of other vascular implants and grafts: Secondary | ICD-10-CM

## 2022-05-26 DIAGNOSIS — Z9013 Acquired absence of bilateral breasts and nipples: Secondary | ICD-10-CM | POA: Insufficient documentation

## 2022-05-26 DIAGNOSIS — Z79811 Long term (current) use of aromatase inhibitors: Secondary | ICD-10-CM | POA: Insufficient documentation

## 2022-05-26 DIAGNOSIS — G4733 Obstructive sleep apnea (adult) (pediatric): Secondary | ICD-10-CM | POA: Diagnosis not present

## 2022-05-26 MED ORDER — LIDOCAINE-PRILOCAINE 2.5-2.5 % EX KIT: PACK | CUTANEOUS | 0 refills | Status: AC | PRN

## 2022-05-26 MED ORDER — HEPARIN SOD (PORK) LOCK FLUSH 100 UNIT/ML IV SOLN
500.0000 [IU] | Freq: Once | INTRAVENOUS | Status: AC | PRN
  Administered 2022-05-26: 500 [IU]

## 2022-05-26 MED ORDER — LIDOCAINE-PRILOCAINE 2.5-2.5 % EX CREA: TOPICAL_CREAM | CUTANEOUS | Status: DC | PRN

## 2022-05-26 MED ORDER — DIPHENHYDRAMINE HCL 25 MG PO CAPS
50.0000 mg | ORAL_CAPSULE | Freq: Once | ORAL | Status: AC
  Administered 2022-05-26: 50 mg via ORAL
  Filled 2022-05-26: qty 2

## 2022-05-26 MED ORDER — TRASTUZUMAB-ANNS CHEMO 150 MG IV SOLR
6.0000 mg/kg | Freq: Once | INTRAVENOUS | Status: AC
  Administered 2022-05-26: 504 mg via INTRAVENOUS
  Filled 2022-05-26: qty 24

## 2022-05-26 MED ORDER — SODIUM CHLORIDE 0.9% FLUSH
10.0000 mL | INTRAVENOUS | Status: DC | PRN
  Administered 2022-05-26: 10 mL

## 2022-05-26 MED ORDER — ACETAMINOPHEN 325 MG PO TABS
650.0000 mg | ORAL_TABLET | Freq: Once | ORAL | Status: AC
  Administered 2022-05-26: 650 mg via ORAL
  Filled 2022-05-26: qty 2

## 2022-05-26 MED ORDER — SODIUM CHLORIDE 0.9 % IV SOLN: Freq: Once | INTRAVENOUS | Status: AC

## 2022-05-26 NOTE — Addendum Note (Signed)
Addended by: Merlene Laughter B on: 05/26/2022 01:43 PM   Modules accepted: Orders

## 2022-05-26 NOTE — Progress Notes (Signed)
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 This is a highly suspicious finding for which ultrasound-guided biopsy is recommended. Adjacent/nearly contiguous complex cystic and solid mass within the RIGHT breast at the 9:30 o'clock axis, 3 cm from the nipple, measuring 1.3 cm. This is a suspicious finding for which ultrasound-guided biopsy is recommended. Highly suspicious calcifications within the inner LEFT breast, with a segmental distribution, measuring 5.7 cm extent. Recommend 2 site stereotactic biopsy to include the more posterior calcifications and the more anterior/retroareolar calcifications.    12/30/2021 Pathology Results   Left breast needle core biopsy from post extent microcalcs showed IDC, overall grade 3.  Prognostics showed ER 100% positive, strong staining intensity, PR 100% positive, strong staining intensity, Ki 67 of 20%. 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  Second area in the right breast needle core biopsy coil clip also showed intermediate grade invasive mammary carcinoma.  Prognostics showed ER 100% positive strong staining PR 15% positive strong staining Ki-67 of 10% and HER2 negative by FISH.  Coil clip prognostic showed ER 95% positive strong staining PR 95% positive strong staining Ki-67 of 30% and HER2 negative by [facility]   02/09/2022 Surgery   Bilateral mastectomies Right breast: ILC, intermediate grade, 1.5cm, LCIS, DCIS, 1LN identifed in breast tissue and negative, T1c, N0   02/09/2022 Cancer Staging   Staging form: Breast, AJCC 8th Edition - Pathologic stage from 02/09/2022: Stage IA (pT1c, pN0, cM0, G2, ER+, PR+, HER2-) - Signed by Loa Socks, NP on 05/05/2022 Stage prefix: Initial diagnosis Histologic grading system: 3 grade system   03/27/2022 -  Chemotherapy   Patient is on Treatment Plan : BREAST MAINTENANCE Trastuzumab IV (6) or SQ (600) D1 q21d x 13 cycles     03/2022 -  Anti-estrogen oral therapy   Anastrozole   Invasive ductal carcinoma of left breast (HCC)  02/09/2022 Surgery   Bilateral Mastectomies: Left breast mastectomy: IDC, g2, 0.3cm, DCIS, ILC intermediate grade, 0.6cm, LCIS, 1SLN + for ITC, 4 SLN negative for malignancy   02/09/2022 Cancer Staging   Staging form: Breast, AJCC 8th Edition - Pathologic stage from 02/09/2022: Stage IA (pT1b, pN0(i+), cM0, G2, ER+, PR+, HER2+) - Signed by Loa Socks, NP on 05/05/2022 Histologic grading system: 3 grade system   03/27/2022 -  Chemotherapy   Patient is on Treatment Plan : BREAST MAINTENANCE Trastuzumab IV (6) or SQ (600) D1 q21d x 13 cycles     03/2022 -  Anti-estrogen oral therapy   Anastrozole      CURRENT THERAPY: Anastrozole/Herceptin  INTERVAL HISTORY: [name] 77 y.o. female returns for follow-Shea of her breast cancer prior to receiving Herceptin.  She is tolerating it well.  She continues on anastrozole daily and has mild hot flashes but otherwise no complaints.  Her most recent echo occurred on 05/04/2022 and demonstrated a LVEF of 60-65%.     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  MEDICAL HISTORY: Past Medical History:  Diagnosis Date   Asthma    Deafness in left ear    WEARS HEARING AID LEFT EAR   GERD (gastroesophageal reflux disease)    Hypertension    Pneumonia    in past x 2    SURGICAL HISTORY: Past Surgical History:  Procedure Laterality Date   ABDOMINAL HYSTERECTOMY     BREAST BIOPSY Left 12/30/2021   MM LT BREAST BX W LOC DEV 1ST LESION IMAGE BX SPEC STEREO GUIDE 12/30/2021 [facility]   BREAST BIOPSY Right 01/02/2022   [place] RT BREAST BX W LOC DEV 1ST LESION IMG BX SPEC US GUIDE 01/02/2022 [facility]   BREAST BIOPSY Right 01/02/2022   [place] RT BREAST BX W LOC DEV EA ADD LESION IMG BX SPEC US GUIDE 01/02/2022 [facility]   BREAST BIOPSY Left 12/30/2021   MM LT BREAST BX W LOC DEV EA AD LESION IMG BX SPEC STEREO GUIDE 12/30/2021 [facility]   CHOLECYSTECTOMY     EYE SURGERY  2016   bilateral cataract surgery   NEPHRECTOMY Left 02/04/2015   Procedure: OPEN RADICAL NEPHRECTOMY;  Surgeon: Heloise Purpura, MD;  Location: [facility];  Service: Urology;  Laterality: Left;   PORTACATH PLACEMENT Left 02/09/2022   Procedure: INSERTION PORT-A-CATH;  Surgeon: Abigail Miyamoto, MD;  Location: [facility]  OR;  Service: General;  Laterality: Left;   SENTINEL NODE BIOPSY Bilateral 02/09/2022   Procedure: BILATERAL SENTINEL NODE BIOPSY;  Surgeon: Abigail Miyamoto, MD;  Location: [facility] OR;  Service: General;  Laterality: Bilateral;   TOTAL MASTECTOMY Bilateral 02/09/2022   Procedure: BILATERAL TOTAL MASTECTOMY;  Surgeon: Abigail Miyamoto, MD;  Location: [facility] OR;  Service: General;  Laterality: Bilateral;   TUBAL LIGATION      SOCIAL HISTORY: Social History   Socioeconomic History   Marital status: Married    Spouse name: Not on file   Number of children: 2   Years of education: Not on file   Highest education level: Not on file  Occupational History   Not on file  Tobacco Use   Smoking status: Never   Smokeless tobacco: Not on file  Substance and Sexual Activity   Alcohol use: No   Drug use: No   Sexual activity: Not on file  Other Topics Concern   Not on file  Social History Narrative   Not on file   Social Determinants of Health   Financial Resource Strain: Low Risk  (01/30/2022)   Overall Financial Resource Strain (CARDIA)  Difficulty of Paying Living Expenses: Not very hard  Food Insecurity: No Food Insecurity (01/30/2022)   Hunger Vital Sign    Worried About Running Out of Food in the Last Year: Never true    Ran Out of Food in the Last Year: Never true  Transportation Needs: Not on file  Physical Activity: Not on file  Stress: Not on file  Social Connections: Not on file  Intimate Partner Violence: Not on file    FAMILY HISTORY: Family History  Problem Relation Age of Onset   Breast cancer Mother 64 - 65   Colon cancer Mother 67   Breast cancer Sister 60 - 21   Colon cancer Sister 58 - 22   Sleep apnea Brother    Cancer Brother        unknown type, possibly stomach cancer   Thyroid cancer Daughter 46    Review of Systems  Constitutional:  Negative for appetite change, chills, fatigue, fever and unexpected weight change.  HENT:   Negative for hearing loss,  lump/mass and trouble swallowing.   Eyes:  Negative for eye problems and icterus.  Respiratory:  Negative for chest tightness, cough and shortness of breath.   Cardiovascular:  Negative for chest pain, leg swelling and palpitations.  Gastrointestinal:  Negative for abdominal distention, abdominal pain, constipation, diarrhea, nausea and vomiting.  Endocrine: Positive for hot flashes.  Genitourinary:  Negative for difficulty urinating.   Musculoskeletal:  Negative for arthralgias.  Skin:  Negative for itching and rash.  Neurological:  Negative for dizziness, extremity weakness, headaches and numbness.  Hematological:  Negative for adenopathy. Does not bruise/bleed easily.  Psychiatric/Behavioral:  Negative for depression. The patient is not nervous/anxious.       PHYSICAL EXAMINATION   Onc Performance Status - 05/26/22 1325       ECOG Perf Status   ECOG Perf Status Fully active, able to carry on all pre-disease performance without restriction      KPS SCALE   KPS % SCORE Able to carry on normal activity, minor s/s of disease             Vitals:   05/26/22 1311  BP: (!) 156/67  Pulse: 67  Resp: 15  Temp: 97.7 F (36.5 C)  SpO2: 96%    Physical Exam Constitutional:      General: She is not in acute distress.    Appearance: Normal appearance. She is not toxic-appearing.  HENT:     Head: Normocephalic and atraumatic.  Eyes:     General: No scleral icterus. Cardiovascular:     Rate and Rhythm: Normal rate and regular rhythm.     Pulses: Normal pulses.     Heart sounds: Normal heart sounds.  Pulmonary:     Effort: Pulmonary effort is normal.     Breath sounds: Normal breath sounds.  Abdominal:     General: Abdomen is flat. Bowel sounds are normal. There is no distension.     Palpations: Abdomen is soft.     Tenderness: There is no abdominal tenderness.  Musculoskeletal:        General: No swelling.     Cervical back: Neck supple.  Lymphadenopathy:      Cervical: No cervical adenopathy.  Skin:    General: Skin is warm and dry.     Findings: No rash.  Neurological:     General: No focal deficit present.     Mental Status: She is alert.  Psychiatric:  Mood and Affect: Mood normal.        Behavior: Behavior normal.     LABORATORY DATA:  None today    ASSESSMENT and THERAPY PLAN:   Invasive ductal carcinoma of left breast (HCC) [name] is a 77 year old woman with bilateral breast cancer on the right ER/PR positive and on the left ER/PR and HER2 positive.  She is status post bilateral mastectomies continues on anastrozole daily, and receives Herceptin every 3 weeks.  Bilateral breast cancer: She is tolerating treatment with Herceptin and anastrozole well.  She has no clinical signs of breast cancer progression today.  She will continue her current treatment. Bone health: Her bone density testing is scheduled in July. At risk for heart failure: Her most recent echocardiogram completed in April was normal.  Next echo is due in July.  [name] will return in 3 weeks for follow-Shea and her next infusion.  All questions were answered. The patient knows to call the clinic with any problems, questions or concerns. We can certainly see the patient much sooner if necessary.  Total encounter time:20 minutes*in face-to-face visit time, chart review, lab review, care coordination, order entry, and documentation of the encounter time.  Lillard Anes, NP 05/26/22 1:58 PM Medical Oncology and Hematology [facility] [address] [place], [place] [address] Tel. [phone]    Fax. [phone]  *Total Encounter Time as defined by the Centers for Medicare and Medicaid Services includes, in addition to the face-to-face time of a patient visit (documented in the note above) non-face-to-face time: obtaining and reviewing outside history, ordering and reviewing medications, tests or procedures, care coordination (communications  with other health care professionals or caregivers) and documentation in the medical record.

## 2022-05-26 NOTE — Patient Instructions (Signed)
[facility]  Discharge Instructions: Thank you for choosing [facility] to provide your oncology and hematology care.   If you have a lab appointment with the Cancer Center, please go directly to the Cancer Center and check in at the registration area.   Wear comfortable clothing and clothing appropriate for easy access to any Portacath or PICC line.   We strive to give you quality time with your provider. You may need to reschedule your appointment if you arrive late (15 or more minutes).  Arriving late affects you and other patients whose appointments are after yours.  Also, if you miss three or more appointments without notifying the office, you may be dismissed from the clinic at the provider's discretion.      For prescription refill requests, have your pharmacy contact our office and allow 72 hours for refills to be completed.    Today you received the following chemotherapy and/or immunotherapy agents: trastuzumab       To help prevent nausea and vomiting after your treatment, we encourage you to take your nausea medication as directed.  BELOW ARE SYMPTOMS THAT SHOULD BE REPORTED IMMEDIATELY: *FEVER GREATER THAN 100.4 F (38 C) OR HIGHER *CHILLS OR SWEATING *NAUSEA AND VOMITING THAT IS NOT CONTROLLED WITH YOUR NAUSEA MEDICATION *UNUSUAL SHORTNESS OF BREATH *UNUSUAL BRUISING OR BLEEDING *URINARY PROBLEMS (pain or burning when urinating, or frequent urination) *BOWEL PROBLEMS (unusual diarrhea, constipation, pain near the anus) TENDERNESS IN MOUTH AND THROAT WITH OR WITHOUT PRESENCE OF ULCERS (sore throat, sores in mouth, or a toothache) UNUSUAL RASH, SWELLING OR PAIN  UNUSUAL VAGINAL DISCHARGE OR ITCHING   Items with * indicate a potential emergency and should be followed up as soon as possible or go to the Emergency Department if any problems should occur.  Please show the CHEMOTHERAPY ALERT CARD or IMMUNOTHERAPY ALERT CARD at  check-in to the Emergency Department and triage nurse.  Should you have questions after your visit or need to cancel or reschedule your appointment, please contact [facility]  Dept: [phone]  and follow the prompts.  Office hours are 8:00 a.m. to 4:30 p.m. Monday - Friday. Please note that voicemails left after 4:00 p.m. may not be returned until the following business day.  We are closed weekends and major holidays. You have access to a nurse at all times for urgent questions. Please call the main number to the clinic Dept: [phone] and follow the prompts.   For any non-urgent questions, you may also contact your provider using MyChart. We now offer e-Visits for anyone 18 and older to request care online for non-urgent symptoms. For details visit [email or web address].   Also download the MyChart app! Go to the app store, search "MyChart", open the app, select [place], and log in with your MyChart username and password.   

## 2022-05-26 NOTE — Telephone Encounter (Signed)
Community msg sent to [facility] for new ono order

## 2022-05-26 NOTE — Assessment & Plan Note (Signed)
[name] is a 77 year old woman with bilateral breast cancer on the right ER/PR positive and on the left ER/PR and HER2 positive.  She is status post bilateral mastectomies continues on anastrozole daily, and receives Herceptin every 3 weeks.  Bilateral breast cancer: She is tolerating treatment with Herceptin and anastrozole well.  She has no clinical signs of breast cancer progression today.  She will continue her current treatment. Bone health: Her bone density testing is scheduled in July. At risk for heart failure: Her most recent echocardiogram completed in April was normal.  Next echo is due in July.  [name] will return in 3 weeks for follow-up and her next infusion.

## 2022-05-28 ENCOUNTER — Telehealth: Payer: Self-pay | Admitting: Adult Health

## 2022-05-28 NOTE — Telephone Encounter (Signed)
Scheduled appointments per WQ. Left voicemail. 

## 2022-06-16 ENCOUNTER — Inpatient Hospital Stay (HOSPITAL_BASED_OUTPATIENT_CLINIC_OR_DEPARTMENT_OTHER): Payer: Medicare HMO | Admitting: Adult Health

## 2022-06-16 ENCOUNTER — Encounter: Payer: Self-pay | Admitting: Adult Health

## 2022-06-16 ENCOUNTER — Inpatient Hospital Stay: Payer: Medicare HMO | Attending: Hematology and Oncology

## 2022-06-16 DIAGNOSIS — C50912 Malignant neoplasm of unspecified site of left female breast: Secondary | ICD-10-CM | POA: Diagnosis not present

## 2022-06-16 DIAGNOSIS — C50411 Malignant neoplasm of upper-outer quadrant of right female breast: Secondary | ICD-10-CM | POA: Diagnosis present

## 2022-06-16 DIAGNOSIS — Z9013 Acquired absence of bilateral breasts and nipples: Secondary | ICD-10-CM | POA: Insufficient documentation

## 2022-06-16 DIAGNOSIS — Z79899 Other long term (current) drug therapy: Secondary | ICD-10-CM | POA: Diagnosis not present

## 2022-06-16 DIAGNOSIS — Z170 Estrogen receptor positive status [ER+]: Secondary | ICD-10-CM | POA: Diagnosis not present

## 2022-06-16 DIAGNOSIS — C50911 Malignant neoplasm of unspecified site of right female breast: Secondary | ICD-10-CM | POA: Diagnosis not present

## 2022-06-16 DIAGNOSIS — Z5112 Encounter for antineoplastic immunotherapy: Secondary | ICD-10-CM | POA: Diagnosis present

## 2022-06-16 DIAGNOSIS — Z79811 Long term (current) use of aromatase inhibitors: Secondary | ICD-10-CM | POA: Diagnosis not present

## 2022-06-16 MED ORDER — HEPARIN SOD (PORK) LOCK FLUSH 100 UNIT/ML IV SOLN
500.0000 [IU] | Freq: Once | INTRAVENOUS | Status: AC | PRN
  Administered 2022-06-16: 500 [IU]

## 2022-06-16 MED ORDER — SODIUM CHLORIDE 0.9% FLUSH
10.0000 mL | INTRAVENOUS | Status: DC | PRN
  Administered 2022-06-16: 10 mL

## 2022-06-16 MED ORDER — ACETAMINOPHEN 325 MG PO TABS
650.0000 mg | ORAL_TABLET | Freq: Once | ORAL | Status: AC
  Administered 2022-06-16: 650 mg via ORAL
  Filled 2022-06-16: qty 2

## 2022-06-16 MED ORDER — DIPHENHYDRAMINE HCL 25 MG PO CAPS
50.0000 mg | ORAL_CAPSULE | Freq: Once | ORAL | Status: AC
  Administered 2022-06-16: 50 mg via ORAL
  Filled 2022-06-16: qty 2

## 2022-06-16 MED ORDER — SODIUM CHLORIDE 0.9 % IV SOLN: Freq: Once | INTRAVENOUS | Status: AC

## 2022-06-16 MED ORDER — TRASTUZUMAB-ANNS CHEMO 150 MG IV SOLR
6.0000 mg/kg | Freq: Once | INTRAVENOUS | Status: AC
  Administered 2022-06-16: 504 mg via INTRAVENOUS
  Filled 2022-06-16: qty 24

## 2022-06-16 NOTE — Progress Notes (Signed)
[facility] Cancer Follow up:    [name], MD [address] 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 This is a highly suspicious finding for which ultrasound-guided biopsy is recommended. Adjacent/nearly contiguous complex cystic and solid mass within the RIGHT breast at the 9:30 o'clock axis, 3 cm from the nipple, measuring 1.3 cm. This is a suspicious finding for which ultrasound-guided biopsy is recommended. Highly suspicious calcifications within the inner LEFT breast, with a segmental distribution, measuring 5.7 cm extent. Recommend 2 site stereotactic biopsy to include the more posterior calcifications and the more anterior/retroareolar calcifications.    12/30/2021 Pathology Results   Left breast needle core biopsy from post extent microcalcs showed IDC, overall grade 3.  Prognostics showed ER 100% positive, strong staining intensity, PR 100% positive, strong staining intensity, Ki 67 of 20%. 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  Second area in the right breast needle core biopsy coil clip also showed intermediate grade invasive mammary carcinoma.  Prognostics showed ER 100% positive strong staining PR 15% positive strong staining Ki-67 of 10% and HER2 negative by FISH.  Coil clip prognostic showed ER 95% positive strong staining PR 95% positive strong staining Ki-67 of 30% and HER2 negative by [facility]   02/09/2022 Surgery   Bilateral mastectomies Right breast: ILC, intermediate grade, 1.5cm, LCIS, DCIS, 1LN identifed in breast tissue and negative, T1c, N0   02/09/2022 Cancer Staging   Staging form: Breast, AJCC 8th Edition - Pathologic stage from 02/09/2022: Stage IA (pT1c, pN0, cM0, G2, ER+, PR+, HER2-) - Signed by Wendy Socks, NP on 05/05/2022 Stage prefix: Initial diagnosis Histologic grading system: 3 grade system   03/27/2022 -  Chemotherapy   Patient is on Treatment Plan : BREAST MAINTENANCE Trastuzumab IV (6) or SQ (600) D1 q21d x 13 cycles     03/2022 -  Anti-estrogen oral therapy   Anastrozole   Invasive ductal carcinoma of left breast (HCC)  02/09/2022 Surgery   Bilateral Mastectomies: Left breast mastectomy: IDC, g2, 0.3cm, DCIS, ILC intermediate grade, 0.6cm, LCIS, 1SLN + for ITC, 4 SLN negative for malignancy   02/09/2022 Cancer Staging   Staging form: Breast, AJCC 8th Edition - Pathologic stage from 02/09/2022: Stage IA (pT1b, pN0(i+), cM0, G2, ER+, PR+, HER2+) - Signed by Wendy Socks, NP on 05/05/2022 Histologic grading system: 3 grade system   03/27/2022 -  Chemotherapy   Patient is on Treatment Plan : BREAST MAINTENANCE Trastuzumab IV (6) or SQ (600) D1 q21d x 13 cycles     03/2022 -  Anti-estrogen oral therapy   Anastrozole      CURRENT THERAPY: Anastrozole/Herceptin  INTERVAL HISTORY: [name] 77 y.o. female returns for follow-up and evaluation prior to receiving Herceptin therapy.  She is taking anastrozole daily and is tolerating it well.  She denies any significant issues with her therapy.  Her most recent echocardiogram occurred on May 04, 2022 demonstrating a left ventricular ejection fraction of 60 to 65%.  She would like to know if she can get allergy shots in both of her arms or if she needs to restrict her left arm because of lymph node removal.  She is starting to notice more seasonal allergies and would like to get back in with her allergy treatment if possible.   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  MEDICAL HISTORY: Past Medical History:  Diagnosis Date   Asthma    Deafness in left ear    WEARS HEARING AID LEFT EAR   GERD (gastroesophageal reflux disease)    Hypertension    Pneumonia    in past x 2    SURGICAL HISTORY: Past Surgical History:  Procedure Laterality Date   ABDOMINAL HYSTERECTOMY     BREAST BIOPSY Left 12/30/2021   MM LT BREAST BX W LOC DEV 1ST LESION IMAGE BX SPEC STEREO GUIDE 12/30/2021 [facility]   BREAST BIOPSY Right 01/02/2022   [place] RT BREAST BX W LOC DEV 1ST LESION IMG BX SPEC US GUIDE 01/02/2022 [facility]   BREAST BIOPSY Right 01/02/2022   [place] RT BREAST BX W LOC DEV EA ADD LESION IMG BX SPEC US GUIDE 01/02/2022 [facility]   BREAST BIOPSY Left 12/30/2021   MM LT BREAST BX W LOC DEV EA AD LESION IMG BX SPEC STEREO GUIDE 12/30/2021 [facility]   CHOLECYSTECTOMY     EYE SURGERY  2016    bilateral cataract surgery   NEPHRECTOMY Left 02/04/2015   Procedure: OPEN RADICAL NEPHRECTOMY;  Surgeon: Heloise Purpura, MD;  Location: [facility];  Service: Urology;  Laterality: Left;   PORTACATH PLACEMENT Left 02/09/2022   Procedure: INSERTION PORT-A-CATH;  Surgeon: Abigail Miyamoto, MD;  Location: [facility] OR;  Service: General;  Laterality: Left;   SENTINEL NODE BIOPSY Bilateral 02/09/2022   Procedure: BILATERAL SENTINEL NODE BIOPSY;  Surgeon: Abigail Miyamoto, MD;  Location: [facility] OR;  Service: General;  Laterality: Bilateral;   TOTAL MASTECTOMY Bilateral 02/09/2022   Procedure: BILATERAL TOTAL MASTECTOMY;  Surgeon: Abigail Miyamoto, MD;  Location: [facility] OR;  Service: General;  Laterality: Bilateral;   TUBAL LIGATION      SOCIAL HISTORY: Social History   Socioeconomic History   Marital status: Married    Spouse name: Not on file   Number of children: 2   Years of education: Not on file   Highest education level: Not on file  Occupational History   Not on file  Tobacco Use   Smoking status: Never   Smokeless tobacco: Not on file  Substance and Sexual Activity  Alcohol use: No   Drug use: No   Sexual activity: Not on file  Other Topics Concern   Not on file  Social History Narrative   Not on file   Social Determinants of Health   Financial Resource Strain: Low Risk  (01/30/2022)   Overall Financial Resource Strain (CARDIA)    Difficulty of Paying Living Expenses: Not very hard  Food Insecurity: No Food Insecurity (01/30/2022)   Hunger Vital Sign    Worried About Running Out of Food in the Last Year: Never true    Ran Out of Food in the Last Year: Never true  Transportation Needs: Not on file  Physical Activity: Not on file  Stress: Not on file  Social Connections: Not on file  Intimate Partner Violence: Not on file    FAMILY HISTORY: Family History  Problem Relation Age of Onset   Breast cancer Mother 10 - 48   Colon cancer Mother 32   Breast cancer Sister 52 - 2   Colon  cancer Sister 108 - 51   Sleep apnea Brother    Cancer Brother        unknown type, possibly stomach cancer   Thyroid cancer Daughter 38    Review of Systems  Constitutional:  Negative for appetite change, chills, fatigue, fever and unexpected weight change.  HENT:   Negative for hearing loss, lump/mass and trouble swallowing.   Eyes:  Negative for eye problems and icterus.  Respiratory:  Negative for chest tightness, cough and shortness of breath.   Cardiovascular:  Negative for chest pain, leg swelling and palpitations.  Gastrointestinal:  Negative for abdominal distention, abdominal pain, constipation, diarrhea, nausea and vomiting.  Endocrine: Negative for hot flashes.  Genitourinary:  Negative for difficulty urinating.   Musculoskeletal:  Negative for arthralgias.  Skin:  Negative for itching and rash.  Neurological:  Negative for dizziness, extremity weakness, headaches and numbness.  Hematological:  Negative for adenopathy. Does not bruise/bleed easily.  Psychiatric/Behavioral:  Negative for depression. The patient is not nervous/anxious.       PHYSICAL EXAMINATION   Onc Performance Status - 06/16/22 1325       ECOG Perf Status   ECOG Perf Status Fully active, able to carry on all pre-disease performance without restriction      KPS SCALE   KPS % SCORE Able to carry on normal activity, minor s/s of disease             Vitals:   06/16/22 1324  BP: (!) 154/60  Pulse: 61  Resp: 18  Temp: 98.1 F (36.7 C)  SpO2: 97%    Physical Exam Constitutional:      General: She is not in acute distress.    Appearance: Normal appearance. She is not toxic-appearing.  HENT:     Head: Normocephalic and atraumatic.     Mouth/Throat:     Mouth: Mucous membranes are moist.     Pharynx: Oropharynx is clear. No oropharyngeal exudate or posterior oropharyngeal erythema.  Eyes:     General: No scleral icterus. Cardiovascular:     Rate and Rhythm: Normal rate and regular  rhythm.     Pulses: Normal pulses.     Heart sounds: Normal heart sounds.  Pulmonary:     Effort: Pulmonary effort is normal.     Breath sounds: Normal breath sounds.  Abdominal:     General: Abdomen is flat. Bowel sounds are normal. There is no distension.     Palpations: Abdomen is soft.  Tenderness: There is no abdominal tenderness.  Musculoskeletal:        General: No swelling.     Cervical back: Neck supple.  Lymphadenopathy:     Cervical: No cervical adenopathy.  Skin:    General: Skin is warm and dry.     Findings: No rash.  Neurological:     General: No focal deficit present.     Mental Status: She is alert.  Psychiatric:        Mood and Affect: Mood normal.        Behavior: Behavior normal.     LABORATORY DATA:  CBC    Component Value Date/Time   WBC 8.0 04/17/2022 0846   WBC 15.7 (H) 02/10/2022 0515   RBC 5.15 (H) 04/17/2022 0846   HGB 13.2 04/17/2022 0846   HCT 42.2 04/17/2022 0846   PLT 261 04/17/2022 0846   MCV 81.9 04/17/2022 0846   MCH 25.6 (L) 04/17/2022 0846   MCHC 31.3 04/17/2022 0846   RDW 14.3 04/17/2022 0846   LYMPHSABS 2.9 04/17/2022 0846   MONOABS 0.8 04/17/2022 0846   EOSABS 0.3 04/17/2022 0846   BASOSABS 0.1 04/17/2022 0846    CMP     Component Value Date/Time   NA 137 04/17/2022 0846   K 3.5 04/17/2022 0846   CL 101 04/17/2022 0846   CO2 28 04/17/2022 0846   GLUCOSE 114 (H) 04/17/2022 0846   BUN 16 04/17/2022 0846   CREATININE 0.77 04/17/2022 0846   CALCIUM 9.0 04/17/2022 0846   PROT 7.8 04/17/2022 0846   ALBUMIN 3.8 04/17/2022 0846   AST 27 04/17/2022 0846   ALT 30 04/17/2022 0846   ALKPHOS 98 04/17/2022 0846   BILITOT 0.6 04/17/2022 0846   GFRNONAA >60 04/17/2022 0846   GFRAA >60 02/07/2015 0439     ASSESSMENT and THERAPY PLAN:   Invasive ductal carcinoma of left breast (HCC) [name] is a 77 year old woman with bilateral breast cancer on the right ER/PR positive and on the left ER/PR and HER2 positive.  She is  status post bilateral mastectomies continues on anastrozole daily, and receives Herceptin every 3 weeks.  Bilateral breast cancer: She is tolerating treatment with Herceptin and anastrozole well.  She will continue with this and will proceed with Herceptin today.  Bone health: Her bone density testing is scheduled in July. At risk for heart failure: Her most recent echocardiogram completed in April was normal.  Next echo is due in July. Allergy injections: I recommended that she forego receiving injections in her left arm since she had 5 lymph nodes removed from the arm and is at increased risk for lymphedema.  She verbalized understanding and will follow-up with her allergy clinician about this.  [name] will return in 3 weeks for follow-up and her next infusion.    All questions were answered. The patient knows to call the clinic with any problems, questions or concerns. We can certainly see the patient much sooner if necessary.  Total encounter time:20 minutes*in face-to-face visit time, chart review, lab review, care coordination, order entry, and documentation of the encounter time.    Lillard Anes, NP 06/16/22 2:11 PM Medical Oncology and Hematology [facility] [address] [place], [place] [address] Tel. [phone]    Fax. [phone]  *Total Encounter Time as defined by the Centers for Medicare and Medicaid Services includes, in addition to the face-to-face time of a patient visit (documented in the note above) non-face-to-face time: obtaining and reviewing outside history, ordering and reviewing  medications, tests or procedures, care coordination (communications with other health care professionals or caregivers) and documentation in the medical record.

## 2022-06-16 NOTE — Patient Instructions (Signed)
[facility]  Discharge Instructions: Thank you for choosing [facility] to provide your oncology and hematology care.   If you have a lab appointment with the Cancer Center, please go directly to the Cancer Center and check in at the registration area.   Wear comfortable clothing and clothing appropriate for easy access to any Portacath or PICC line.   We strive to give you quality time with your provider. You may need to reschedule your appointment if you arrive late (15 or more minutes).  Arriving late affects you and other patients whose appointments are after yours.  Also, if you miss three or more appointments without notifying the office, you may be dismissed from the clinic at the provider's discretion.      For prescription refill requests, have your pharmacy contact our office and allow 72 hours for refills to be completed.    Today you received the following chemotherapy and/or immunotherapy agents: trastuzumab       To help prevent nausea and vomiting after your treatment, we encourage you to take your nausea medication as directed.  BELOW ARE SYMPTOMS THAT SHOULD BE REPORTED IMMEDIATELY: *FEVER GREATER THAN 100.4 F (38 C) OR HIGHER *CHILLS OR SWEATING *NAUSEA AND VOMITING THAT IS NOT CONTROLLED WITH YOUR NAUSEA MEDICATION *UNUSUAL SHORTNESS OF BREATH *UNUSUAL BRUISING OR BLEEDING *URINARY PROBLEMS (pain or burning when urinating, or frequent urination) *BOWEL PROBLEMS (unusual diarrhea, constipation, pain near the anus) TENDERNESS IN MOUTH AND THROAT WITH OR WITHOUT PRESENCE OF ULCERS (sore throat, sores in mouth, or a toothache) UNUSUAL RASH, SWELLING OR PAIN  UNUSUAL VAGINAL DISCHARGE OR ITCHING   Items with * indicate a potential emergency and should be followed up as soon as possible or go to the Emergency Department if any problems should occur.  Please show the CHEMOTHERAPY ALERT CARD or IMMUNOTHERAPY ALERT CARD at  check-in to the Emergency Department and triage nurse.  Should you have questions after your visit or need to cancel or reschedule your appointment, please contact [facility]  Dept: [phone]  and follow the prompts.  Office hours are 8:00 a.m. to 4:30 p.m. Monday - Friday. Please note that voicemails left after 4:00 p.m. may not be returned until the following business day.  We are closed weekends and major holidays. You have access to a nurse at all times for urgent questions. Please call the main number to the clinic Dept: [phone] and follow the prompts.   For any non-urgent questions, you may also contact your provider using MyChart. We now offer e-Visits for anyone 18 and older to request care online for non-urgent symptoms. For details visit [email or web address].   Also download the MyChart app! Go to the app store, search "MyChart", open the app, select [facility], and log in with your MyChart username and password.   

## 2022-06-16 NOTE — Assessment & Plan Note (Signed)
[name] is a 77 year old woman with bilateral breast cancer on the right ER/PR positive and on the left ER/PR and HER2 positive.  She is status post bilateral mastectomies continues on anastrozole daily, and receives Herceptin every 3 weeks.  Bilateral breast cancer: She is tolerating treatment with Herceptin and anastrozole well.  She will continue with this and will proceed with Herceptin today.  Bone health: Her bone density testing is scheduled in July. At risk for heart failure: Her most recent echocardiogram completed in April was normal.  Next echo is due in July. Allergy injections: I recommended that she forego receiving injections in her left arm since she had 5 lymph nodes removed from the arm and is at increased risk for lymphedema.  She verbalized understanding and will follow-up with her allergy clinician about this.  [name] will return in 3 weeks for follow-up and her next infusion.

## 2022-06-24 ENCOUNTER — Encounter: Payer: Self-pay | Admitting: *Deleted

## 2022-06-26 DIAGNOSIS — G4733 Obstructive sleep apnea (adult) (pediatric): Secondary | ICD-10-CM | POA: Diagnosis not present

## 2022-06-26 DIAGNOSIS — I10 Essential (primary) hypertension: Secondary | ICD-10-CM | POA: Diagnosis not present

## 2022-07-07 ENCOUNTER — Other Ambulatory Visit: Payer: Self-pay

## 2022-07-07 ENCOUNTER — Inpatient Hospital Stay: Payer: Medicare HMO

## 2022-07-07 ENCOUNTER — Inpatient Hospital Stay: Payer: Medicare HMO | Admitting: Hematology and Oncology

## 2022-07-07 VITALS — BP 147/67 | HR 73 | Temp 97.7°F | Resp 18 | Wt 193.3 lb

## 2022-07-07 DIAGNOSIS — Z5112 Encounter for antineoplastic immunotherapy: Secondary | ICD-10-CM | POA: Diagnosis not present

## 2022-07-07 DIAGNOSIS — C50912 Malignant neoplasm of unspecified site of left female breast: Secondary | ICD-10-CM

## 2022-07-07 DIAGNOSIS — Z79811 Long term (current) use of aromatase inhibitors: Secondary | ICD-10-CM | POA: Diagnosis not present

## 2022-07-07 DIAGNOSIS — I10 Essential (primary) hypertension: Secondary | ICD-10-CM | POA: Diagnosis not present

## 2022-07-07 DIAGNOSIS — C50411 Malignant neoplasm of upper-outer quadrant of right female breast: Secondary | ICD-10-CM | POA: Diagnosis not present

## 2022-07-07 DIAGNOSIS — C50911 Malignant neoplasm of unspecified site of right female breast: Secondary | ICD-10-CM | POA: Diagnosis not present

## 2022-07-07 DIAGNOSIS — Z170 Estrogen receptor positive status [ER+]: Secondary | ICD-10-CM | POA: Diagnosis not present

## 2022-07-07 DIAGNOSIS — Z9013 Acquired absence of bilateral breasts and nipples: Secondary | ICD-10-CM | POA: Diagnosis not present

## 2022-07-07 DIAGNOSIS — G4733 Obstructive sleep apnea (adult) (pediatric): Secondary | ICD-10-CM | POA: Diagnosis not present

## 2022-07-07 DIAGNOSIS — Z79899 Other long term (current) drug therapy: Secondary | ICD-10-CM | POA: Diagnosis not present

## 2022-07-07 MED ORDER — ACETAMINOPHEN 325 MG PO TABS
650.0000 mg | ORAL_TABLET | Freq: Once | ORAL | Status: AC
  Administered 2022-07-07: 650 mg via ORAL
  Filled 2022-07-07: qty 2

## 2022-07-07 MED ORDER — DIPHENHYDRAMINE HCL 25 MG PO CAPS
50.0000 mg | ORAL_CAPSULE | Freq: Once | ORAL | Status: AC
  Administered 2022-07-07: 50 mg via ORAL
  Filled 2022-07-07: qty 2

## 2022-07-07 MED ORDER — TRASTUZUMAB-ANNS CHEMO 150 MG IV SOLR
6.0000 mg/kg | Freq: Once | INTRAVENOUS | Status: AC
  Administered 2022-07-07: 504 mg via INTRAVENOUS
  Filled 2022-07-07: qty 24

## 2022-07-07 MED ORDER — HEPARIN SOD (PORK) LOCK FLUSH 100 UNIT/ML IV SOLN
500.0000 [IU] | Freq: Once | INTRAVENOUS | Status: AC | PRN
  Administered 2022-07-07: 500 [IU]

## 2022-07-07 MED ORDER — SODIUM CHLORIDE 0.9% FLUSH
10.0000 mL | INTRAVENOUS | Status: DC | PRN
  Administered 2022-07-07: 10 mL

## 2022-07-07 MED ORDER — SODIUM CHLORIDE 0.9 % IV SOLN: Freq: Once | INTRAVENOUS | Status: AC

## 2022-07-07 NOTE — Assessment & Plan Note (Signed)
This is a very pleasant 76-year-old female patient with bilateral breast cancer, right-sided biopsy showing invasive lobular cancer, ER/PR positive HER2 negative referred to medical oncology for recommendations.   She has bilateral breast cancer.  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   Oncotype of 10, no role for adjuvant chemotherapy. She is tolerating anastrozole extremely well.  She will continue anastrozole.  No concerns on review of systems or physical exam.  

## 2022-07-07 NOTE — Assessment & Plan Note (Signed)
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   This lesion measured 3 mm. ITC noted in the left axillary LN. Unfortunately progs cannot be done on ITC. She is on Herceptin alone.  She is due for another echo in July and this has been ordered.  No concerns on review of systems and physical exam.  She can return to the clinic every 6 weeks for follow-up.  She gets infusion every 3 weeks.

## 2022-07-07 NOTE — Progress Notes (Signed)
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 This is a highly suspicious finding for which ultrasound-guided biopsy is recommended. Adjacent/nearly contiguous complex cystic and solid mass within the RIGHT breast at the 9:30 o'clock axis, 3 cm from the nipple, measuring 1.3 cm. This is a suspicious finding for which ultrasound-guided biopsy is recommended. Highly suspicious calcifications within the inner LEFT breast, with a segmental distribution, measuring 5.7 cm extent. Recommend 2 site stereotactic biopsy to include the more posterior calcifications and the more anterior/retroareolar calcifications.    12/30/2021 Pathology Results   Left breast needle core biopsy from post extent microcalcs showed IDC, overall grade 3.  Prognostics showed ER 100% positive, strong staining intensity, PR 100% positive, strong staining intensity, Ki 67 of 20%. 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  Second area in the right breast needle core biopsy coil clip also showed intermediate grade invasive mammary carcinoma.  Prognostics showed ER 100% positive strong staining PR 15% positive strong staining Ki-67 of 10% and HER2 negative by FISH.  Coil clip prognostic showed ER 95% positive strong staining PR 95% positive strong staining Ki-67 of 30% and HER2 negative by [facility]   02/09/2022 Surgery   Bilateral mastectomies Right breast: ILC, intermediate grade, 1.5cm, LCIS, DCIS, 1LN identifed in breast tissue and negative, T1c, N0   02/09/2022 Cancer Staging   Staging form: Breast, AJCC 8th Edition - Pathologic stage from 02/09/2022: Stage IA (pT1c, pN0, cM0, G2, ER+, PR+, HER2-) - Signed by Loa Socks, NP on 05/05/2022 Stage prefix: Initial diagnosis Histologic grading system: 3 grade system   03/27/2022 -  Chemotherapy   Patient is on Treatment Plan : BREAST MAINTENANCE Trastuzumab IV (6) or SQ (600) D1 q21d x 13 cycles     03/2022 -  Anti-estrogen oral therapy   Anastrozole   Invasive ductal carcinoma of left breast (HCC)  02/09/2022 Surgery   Bilateral Mastectomies: Left breast mastectomy: IDC, g2, 0.3cm, DCIS, ILC intermediate grade, 0.6cm, LCIS, 1SLN + for ITC, 4 SLN negative for malignancy   02/09/2022 Cancer Staging   Staging form: Breast, AJCC 8th Edition - Pathologic stage from 02/09/2022: Stage IA (pT1b, pN0(i+), cM0, G2, ER+, PR+, HER2+) - Signed by Loa Socks, NP on 05/05/2022 Histologic grading system: 3 grade system   03/27/2022 -  Chemotherapy   Patient is on Treatment Plan : BREAST MAINTENANCE Trastuzumab IV (6) or SQ (600) D1 q21d x 13 cycles     03/2022 -  Anti-estrogen oral therapy   Anastrozole      CURRENT THERAPY: Anastrozole/Herceptin  INTERVAL HISTORY:  [name] 77 y.o. female returns for follow-up and evaluation prior to receiving Herceptin therapy.  She is taking anastrozole daily and is tolerating it well.  She denies any significant issues with her therapy.  Her most recent echocardiogram occurred on May 04, 2022 demonstrating a left ventricular ejection fraction of 60 to 65%.  Since her last visit here, she denies any new health issues at all.  She is feeling well.   She denies any adverse effects at all.  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  MEDICAL HISTORY: Past Medical History:  Diagnosis Date   Asthma    Deafness in left ear    WEARS HEARING AID LEFT EAR   GERD (gastroesophageal reflux disease)    Hypertension    Pneumonia    in past x 2    SURGICAL HISTORY: Past Surgical History:  Procedure Laterality Date   ABDOMINAL HYSTERECTOMY     BREAST BIOPSY Left 12/30/2021   MM LT BREAST BX W LOC DEV 1ST LESION IMAGE BX SPEC STEREO GUIDE 12/30/2021 [facility]   BREAST BIOPSY Right 01/02/2022   [place] RT BREAST BX W LOC DEV 1ST LESION IMG BX SPEC US GUIDE 01/02/2022 [facility]   BREAST BIOPSY Right 01/02/2022   [place] RT BREAST BX W LOC DEV EA ADD LESION IMG BX SPEC US GUIDE 01/02/2022 [facility]   BREAST BIOPSY Left 12/30/2021   MM LT BREAST BX W LOC DEV EA AD LESION IMG BX SPEC STEREO GUIDE 12/30/2021 [facility]   CHOLECYSTECTOMY     EYE SURGERY  2016   bilateral cataract surgery   NEPHRECTOMY Left 02/04/2015   Procedure:  OPEN RADICAL NEPHRECTOMY;  Surgeon: Heloise Purpura, MD;  Location: [facility];  Service: Urology;  Laterality: Left;   PORTACATH PLACEMENT Left 02/09/2022   Procedure: INSERTION PORT-A-CATH;  Surgeon: Abigail Miyamoto, MD;  Location: [facility] OR;  Service: General;  Laterality: Left;   SENTINEL NODE BIOPSY Bilateral 02/09/2022   Procedure: BILATERAL SENTINEL NODE BIOPSY;  Surgeon: Abigail Miyamoto, MD;  Location: [facility] OR;  Service: General;  Laterality: Bilateral;   TOTAL MASTECTOMY Bilateral 02/09/2022   Procedure: BILATERAL TOTAL MASTECTOMY;  Surgeon: Abigail Miyamoto, MD;  Location: [facility] OR;  Service: General;  Laterality: Bilateral;   TUBAL LIGATION      SOCIAL HISTORY: Social History   Socioeconomic History   Marital status: Married    Spouse name: Not on file   Number of children: 2   Years of education: Not on file   Highest education level: Not on file  Occupational History   Not on file  Tobacco Use   Smoking status: Never   Smokeless tobacco: Not on file  Substance and Sexual Activity   Alcohol use: No   Drug use: No   Sexual activity: Not  on file  Other Topics Concern   Not on file  Social History Narrative   Not on file   Social Determinants of Health   Financial Resource Strain: Low Risk  (01/30/2022)   Overall Financial Resource Strain (CARDIA)    Difficulty of Paying Living Expenses: Not very hard  Food Insecurity: No Food Insecurity (01/30/2022)   Hunger Vital Sign    Worried About Running Out of Food in the Last Year: Never true    Ran Out of Food in the Last Year: Never true  Transportation Needs: Not on file  Physical Activity: Not on file  Stress: Not on file  Social Connections: Not on file  Intimate Partner Violence: Not on file    FAMILY HISTORY: Family History  Problem Relation Age of Onset   Breast cancer Mother 13 - 29   Colon cancer Mother 30   Breast cancer Sister 37 - 99   Colon cancer Sister 56 - 21   Sleep apnea Brother    Cancer Brother         unknown type, possibly stomach cancer   Thyroid cancer Daughter 33    Review of Systems  Constitutional:  Negative for appetite change, chills, fatigue, fever and unexpected weight change.  HENT:   Negative for hearing loss, lump/mass and trouble swallowing.   Eyes:  Negative for eye problems and icterus.  Respiratory:  Negative for chest tightness, cough and shortness of breath.   Cardiovascular:  Negative for chest pain, leg swelling and palpitations.  Gastrointestinal:  Negative for abdominal distention, abdominal pain, constipation, diarrhea, nausea and vomiting.  Endocrine: Negative for hot flashes.  Genitourinary:  Negative for difficulty urinating.   Musculoskeletal:  Negative for arthralgias.  Skin:  Negative for itching and rash.  Neurological:  Negative for dizziness, extremity weakness, headaches and numbness.  Hematological:  Negative for adenopathy. Does not bruise/bleed easily.  Psychiatric/Behavioral:  Negative for depression. The patient is not nervous/anxious.       PHYSICAL EXAMINATION     Vitals:   07/07/22 1159  BP: (!) 147/67  Pulse: 73  Resp: 18  Temp: 97.7 F (36.5 C)  SpO2: 95%    Physical Exam Constitutional:      General: She is not in acute distress.    Appearance: Normal appearance. She is not toxic-appearing.  HENT:     Head: Normocephalic and atraumatic.     Mouth/Throat:     Mouth: Mucous membranes are moist.     Pharynx: Oropharynx is clear. No oropharyngeal exudate or posterior oropharyngeal erythema.  Eyes:     General: No scleral icterus. Cardiovascular:     Rate and Rhythm: Normal rate and regular rhythm.     Pulses: Normal pulses.     Heart sounds: Normal heart sounds.  Pulmonary:     Effort: Pulmonary effort is normal.     Breath sounds: Normal breath sounds.  Abdominal:     General: Abdomen is flat. Bowel sounds are normal. There is no distension.     Palpations: Abdomen is soft.     Tenderness: There is no abdominal  tenderness.  Musculoskeletal:        General: No swelling.     Cervical back: Neck supple.  Lymphadenopathy:     Cervical: No cervical adenopathy.  Skin:    General: Skin is warm and dry.     Findings: No rash.  Neurological:     General: No focal deficit present.     Mental Status: She  is alert.  Psychiatric:        Mood and Affect: Mood normal.        Behavior: Behavior normal.     LABORATORY DATA:  CBC    Component Value Date/Time   WBC 8.0 04/17/2022 0846   WBC 15.7 (H) 02/10/2022 0515   RBC 5.15 (H) 04/17/2022 0846   HGB 13.2 04/17/2022 0846   HCT 42.2 04/17/2022 0846   PLT 261 04/17/2022 0846   MCV 81.9 04/17/2022 0846   MCH 25.6 (L) 04/17/2022 0846   MCHC 31.3 04/17/2022 0846   RDW 14.3 04/17/2022 0846   LYMPHSABS 2.9 04/17/2022 0846   MONOABS 0.8 04/17/2022 0846   EOSABS 0.3 04/17/2022 0846   BASOSABS 0.1 04/17/2022 0846    CMP     Component Value Date/Time   NA 137 04/17/2022 0846   K 3.5 04/17/2022 0846   CL 101 04/17/2022 0846   CO2 28 04/17/2022 0846   GLUCOSE 114 (H) 04/17/2022 0846   BUN 16 04/17/2022 0846   CREATININE 0.77 04/17/2022 0846   CALCIUM 9.0 04/17/2022 0846   PROT 7.8 04/17/2022 0846   ALBUMIN 3.8 04/17/2022 0846   AST 27 04/17/2022 0846   ALT 30 04/17/2022 0846   ALKPHOS 98 04/17/2022 0846   BILITOT 0.6 04/17/2022 0846   GFRNONAA >60 04/17/2022 0846   GFRAA >60 02/07/2015 0439     ASSESSMENT and THERAPY PLAN:   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   She has bilateral breast cancer.  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   Oncotype of 10, no role for adjuvant chemotherapy. She is tolerating anastrozole extremely  well.  She will continue anastrozole.  No concerns on review of systems or physical exam.   Invasive ductal carcinoma of left breast (HCC) Left breast biopsy showed invasive ductal carcinoma, high-grade in the anterior calcifications, ER/PR and HER2 amplified.   This lesion measured 3 mm. ITC noted in the left axillary LN. Unfortunately progs cannot be done on ITC. She is on Herceptin alone.  She is due for another echo in July and this has been ordered.  No concerns on review of systems and physical exam.  She can return to the clinic every 6 weeks for follow-up.  She gets infusion every 3 weeks.    All questions were answered. The patient knows to call the clinic with any problems, questions or concerns. We can certainly see the patient much sooner if necessary.  Total encounter time:20 minutes*in face-to-face visit time, chart review, lab review, care coordination, order entry, and documentation of the encounter time.   *Total Encounter Time as defined by the Centers for Medicare and Medicaid Services includes, in addition to the face-to-face time of a patient visit (documented in the note above) non-face-to-face time: obtaining and reviewing outside history, ordering and reviewing medications, tests or procedures, care coordination (communications with other health care professionals or caregivers) and documentation in the medical record.

## 2022-07-07 NOTE — Patient Instructions (Signed)
[place] [facility]  Discharge Instructions: Thank you for choosing [facility] to provide your oncology and hematology care.   If you have a lab appointment with the Cancer Center, please go directly to the Cancer Center and check in at the registration area.   Wear comfortable clothing and clothing appropriate for easy access to any Portacath or PICC line.   We strive to give you quality time with your provider. You may need to reschedule your appointment if you arrive late (15 or more minutes).  Arriving late affects you and other patients whose appointments are after yours.  Also, if you miss three or more appointments without notifying the office, you may be dismissed from the clinic at the provider's discretion.      For prescription refill requests, have your pharmacy contact our office and allow 72 hours for refills to be completed.    Today you received the following chemotherapy and/or immunotherapy agents: Trastuzumab      To help prevent nausea and vomiting after your treatment, we encourage you to take your nausea medication as directed.  BELOW ARE SYMPTOMS THAT SHOULD BE REPORTED IMMEDIATELY: *FEVER GREATER THAN 100.4 F (38 C) OR HIGHER *CHILLS OR SWEATING *NAUSEA AND VOMITING THAT IS NOT CONTROLLED WITH YOUR NAUSEA MEDICATION *UNUSUAL SHORTNESS OF BREATH *UNUSUAL BRUISING OR BLEEDING *URINARY PROBLEMS (pain or burning when urinating, or frequent urination) *BOWEL PROBLEMS (unusual diarrhea, constipation, pain near the anus) TENDERNESS IN MOUTH AND THROAT WITH OR WITHOUT PRESENCE OF ULCERS (sore throat, sores in mouth, or a toothache) UNUSUAL RASH, SWELLING OR PAIN  UNUSUAL VAGINAL DISCHARGE OR ITCHING   Items with * indicate a potential emergency and should be followed up as soon as possible or go to the Emergency Department if any problems should occur.  Please show the CHEMOTHERAPY ALERT CARD or IMMUNOTHERAPY ALERT CARD at  check-in to the Emergency Department and triage nurse.  Should you have questions after your visit or need to cancel or reschedule your appointment, please contact [place] [facility]  Dept: [phone]  and follow the prompts.  Office hours are 8:00 a.m. to 4:30 p.m. Monday - Friday. Please note that voicemails left after 4:00 p.m. may not be returned until the following business day.  We are closed weekends and major holidays. You have access to a nurse at all times for urgent questions. Please call the main number to the clinic Dept: [phone] and follow the prompts.   For any non-urgent questions, you may also contact your provider using MyChart. We now offer e-Visits for anyone 18 and older to request care online for non-urgent symptoms. For details visit [email or web address].   Also download the MyChart app! Go to the app store, search "MyChart", open the app, select [place], and log in with your MyChart username and password.  

## 2022-07-26 DIAGNOSIS — I10 Essential (primary) hypertension: Secondary | ICD-10-CM | POA: Diagnosis not present

## 2022-07-26 DIAGNOSIS — G4733 Obstructive sleep apnea (adult) (pediatric): Secondary | ICD-10-CM | POA: Diagnosis not present

## 2022-07-28 ENCOUNTER — Inpatient Hospital Stay: Payer: Medicare HMO | Attending: Hematology and Oncology

## 2022-07-28 ENCOUNTER — Inpatient Hospital Stay: Payer: Medicare HMO | Admitting: Hematology and Oncology

## 2022-07-28 ENCOUNTER — Other Ambulatory Visit: Payer: Self-pay

## 2022-07-28 DIAGNOSIS — Z9013 Acquired absence of bilateral breasts and nipples: Secondary | ICD-10-CM | POA: Diagnosis not present

## 2022-07-28 DIAGNOSIS — C50911 Malignant neoplasm of unspecified site of right female breast: Secondary | ICD-10-CM

## 2022-07-28 DIAGNOSIS — Z79811 Long term (current) use of aromatase inhibitors: Secondary | ICD-10-CM | POA: Diagnosis not present

## 2022-07-28 DIAGNOSIS — Z5112 Encounter for antineoplastic immunotherapy: Secondary | ICD-10-CM | POA: Insufficient documentation

## 2022-07-28 DIAGNOSIS — Z170 Estrogen receptor positive status [ER+]: Secondary | ICD-10-CM | POA: Insufficient documentation

## 2022-07-28 DIAGNOSIS — C50411 Malignant neoplasm of upper-outer quadrant of right female breast: Secondary | ICD-10-CM | POA: Diagnosis not present

## 2022-07-28 DIAGNOSIS — C50912 Malignant neoplasm of unspecified site of left female breast: Secondary | ICD-10-CM | POA: Diagnosis not present

## 2022-07-28 MED ORDER — TRASTUZUMAB-ANNS CHEMO 150 MG IV SOLR
6.0000 mg/kg | Freq: Once | INTRAVENOUS | Status: AC
  Administered 2022-07-28: 504 mg via INTRAVENOUS
  Filled 2022-07-28: qty 24

## 2022-07-28 MED ORDER — SODIUM CHLORIDE 0.9% FLUSH
10.0000 mL | INTRAVENOUS | Status: DC | PRN
  Administered 2022-07-28: 10 mL

## 2022-07-28 MED ORDER — SODIUM CHLORIDE 0.9 % IV SOLN: Freq: Once | INTRAVENOUS | Status: AC

## 2022-07-28 MED ORDER — HEPARIN SOD (PORK) LOCK FLUSH 100 UNIT/ML IV SOLN
500.0000 [IU] | Freq: Once | INTRAVENOUS | Status: AC | PRN
  Administered 2022-07-28: 500 [IU]

## 2022-07-28 MED ORDER — DIPHENHYDRAMINE HCL 25 MG PO CAPS
50.0000 mg | ORAL_CAPSULE | Freq: Once | ORAL | Status: AC
  Administered 2022-07-28: 50 mg via ORAL
  Filled 2022-07-28: qty 2

## 2022-07-28 MED ORDER — ACETAMINOPHEN 325 MG PO TABS
650.0000 mg | ORAL_TABLET | Freq: Once | ORAL | Status: AC
  Administered 2022-07-28: 650 mg via ORAL
  Filled 2022-07-28: qty 2

## 2022-07-28 NOTE — Assessment & Plan Note (Signed)
 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   This lesion measured 3 mm. ITC noted in the left axillary LN. Unfortunately progs cannot be done on ITC. She is on Herceptin alone.  She is due for another echo in July and this has been ordered.  No concerns on review of systems and physical exam.  She can return to the clinic every 6 weeks for follow-up.  She gets infusion every 3 weeks.

## 2022-07-28 NOTE — Patient Instructions (Signed)
[place] [facility]  Discharge Instructions: Thank you for choosing [facility] to provide your oncology and hematology care.   If you have a lab appointment with the Cancer Center, please go directly to the Cancer Center and check in at the registration area.   Wear comfortable clothing and clothing appropriate for easy access to any Portacath or PICC line.   We strive to give you quality time with your provider. You may need to reschedule your appointment if you arrive late (15 or more minutes).  Arriving late affects you and other patients whose appointments are after yours.  Also, if you miss three or more appointments without notifying the office, you may be dismissed from the clinic at the provider's discretion.      For prescription refill requests, have your pharmacy contact our office and allow 72 hours for refills to be completed.    Today you received the following chemotherapy and/or immunotherapy agents: Kanjinti.      To help prevent nausea and vomiting after your treatment, we encourage you to take your nausea medication as directed.  BELOW ARE SYMPTOMS THAT SHOULD BE REPORTED IMMEDIATELY: *FEVER GREATER THAN 100.4 F (38 C) OR HIGHER *CHILLS OR SWEATING *NAUSEA AND VOMITING THAT IS NOT CONTROLLED WITH YOUR NAUSEA MEDICATION *UNUSUAL SHORTNESS OF BREATH *UNUSUAL BRUISING OR BLEEDING *URINARY PROBLEMS (pain or burning when urinating, or frequent urination) *BOWEL PROBLEMS (unusual diarrhea, constipation, pain near the anus) TENDERNESS IN MOUTH AND THROAT WITH OR WITHOUT PRESENCE OF ULCERS (sore throat, sores in mouth, or a toothache) UNUSUAL RASH, SWELLING OR PAIN  UNUSUAL VAGINAL DISCHARGE OR ITCHING   Items with * indicate a potential emergency and should be followed up as soon as possible or go to the Emergency Department if any problems should occur.  Please show the CHEMOTHERAPY ALERT CARD or IMMUNOTHERAPY ALERT CARD at  check-in to the Emergency Department and triage nurse.  Should you have questions after your visit or need to cancel or reschedule your appointment, please contact [place] [facility]  Dept: [phone]  and follow the prompts.  Office hours are 8:00 a.m. to 4:30 p.m. Monday - Friday. Please note that voicemails left after 4:00 p.m. may not be returned until the following business day.  We are closed weekends and major holidays. You have access to a nurse at all times for urgent questions. Please call the main number to the clinic Dept: [phone] and follow the prompts.   For any non-urgent questions, you may also contact your provider using MyChart. We now offer e-Visits for anyone 18 and older to request care online for non-urgent symptoms. For details visit [email or web address].   Also download the MyChart app! Go to the app store, search "MyChart", open the app, select [place], and log in with your MyChart username and password.   

## 2022-07-28 NOTE — Assessment & Plan Note (Signed)
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   She has bilateral breast cancer.  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   Oncotype of 10, no role for adjuvant chemotherapy. She is tolerating anastrozole extremely well.  She will continue anastrozole.  No concerns on review of systems or physical exam. Mammogram due Dec 2024. I would recommend 5-10 yrs of anti estrogen therapy.

## 2022-07-28 NOTE — Progress Notes (Unsigned)
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 This is a highly suspicious finding for which ultrasound-guided biopsy is recommended. Adjacent/nearly contiguous complex cystic and solid mass within the RIGHT breast at the 9:30 o'clock axis, 3 cm from the nipple, measuring 1.3 cm. This is a suspicious finding for which ultrasound-guided biopsy is recommended. Highly suspicious calcifications within the inner LEFT breast, with a segmental distribution, measuring 5.7 cm extent. Recommend 2 site stereotactic biopsy to include the more posterior calcifications and the more anterior/retroareolar calcifications.    12/30/2021 Pathology Results   Left breast needle core biopsy from post extent microcalcs showed IDC, overall grade 3.  Prognostics showed ER 100% positive, strong staining intensity, PR 100% positive, strong staining intensity, Ki 67 of 20%. 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  Second area in the right breast needle core biopsy coil clip also showed intermediate grade invasive mammary carcinoma.  Prognostics showed ER 100% positive strong staining PR 15% positive strong staining Ki-67 of 10% and HER2 negative by FISH.  Coil clip prognostic showed ER 95% positive strong staining PR 95% positive strong staining Ki-67 of 30% and HER2 negative by [facility]   02/09/2022 Surgery   Bilateral mastectomies Right breast: ILC, intermediate grade, 1.5cm, LCIS, DCIS, 1LN identifed in breast tissue and negative, T1c, N0   02/09/2022 Cancer Staging   Staging form: Breast, AJCC 8th Edition - Pathologic stage from 02/09/2022: Stage IA (pT1c, pN0, cM0, G2, ER+, PR+, HER2-) - Signed by Loa Socks, NP on 05/05/2022 Stage prefix: Initial diagnosis Histologic grading system: 3 grade system   03/27/2022 -  Chemotherapy   Patient is on Treatment Plan : BREAST MAINTENANCE Trastuzumab IV (6) or SQ (600) D1 q21d x 13 cycles     03/2022 -  Anti-estrogen oral therapy   Anastrozole   Invasive ductal carcinoma of left breast (HCC)  02/09/2022 Surgery   Bilateral Mastectomies: Left breast mastectomy: IDC, g2, 0.3cm, DCIS, ILC intermediate grade, 0.6cm, LCIS, 1SLN + for ITC, 4 SLN negative for malignancy   02/09/2022 Cancer Staging   Staging form: Breast, AJCC 8th Edition - Pathologic stage from 02/09/2022: Stage IA (pT1b, pN0(i+), cM0, G2, ER+, PR+, HER2+) - Signed by Loa Socks, NP on 05/05/2022 Histologic grading system: 3 grade system   03/27/2022 -  Chemotherapy   Patient is on Treatment Plan : BREAST MAINTENANCE Trastuzumab IV (6) or SQ (600) D1 q21d x 13 cycles     03/2022 -  Anti-estrogen oral therapy   Anastrozole      CURRENT THERAPY: Anastrozole/Herceptin  INTERVAL HISTORY:  [name] 77 y.o. female returns for follow-up and evaluation prior to receiving Herceptin therapy.  She is taking anastrozole daily and is tolerating it well.  She denies any significant issues with her therapy.  Her most recent echocardiogram occurred on May 04, 2022 demonstrating a left ventricular ejection fraction of 60 to 65%.  Since her last visit here, she denies any new health issues at all.  She is feeling well.  She had some questions about prognosis today. She says she is not worried overall. She denies any adverse effects at all.  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  MEDICAL HISTORY: Past Medical History:  Diagnosis Date   Asthma    Deafness in left ear    WEARS HEARING AID LEFT EAR   GERD (gastroesophageal reflux disease)    Hypertension    Pneumonia    in past x 2    SURGICAL HISTORY: Past Surgical History:  Procedure Laterality Date   ABDOMINAL HYSTERECTOMY     BREAST BIOPSY Left 12/30/2021   MM LT BREAST BX W LOC DEV 1ST LESION IMAGE BX SPEC STEREO GUIDE 12/30/2021 [facility]   BREAST BIOPSY Right 01/02/2022   [place] RT BREAST BX W LOC DEV 1ST LESION IMG BX SPEC US GUIDE 01/02/2022 [facility]   BREAST BIOPSY Right 01/02/2022   [place] RT BREAST BX W LOC DEV EA ADD LESION IMG BX SPEC US GUIDE 01/02/2022 [facility]   BREAST BIOPSY Left 12/30/2021   MM LT BREAST BX W LOC DEV EA AD LESION IMG BX SPEC STEREO GUIDE 12/30/2021 [facility]   CHOLECYSTECTOMY     EYE SURGERY   2016   bilateral cataract surgery   NEPHRECTOMY Left 02/04/2015   Procedure: OPEN RADICAL NEPHRECTOMY;  Surgeon: Heloise Purpura, MD;  Location: [facility];  Service: Urology;  Laterality: Left;   PORTACATH PLACEMENT Left 02/09/2022   Procedure: INSERTION PORT-A-CATH;  Surgeon: Abigail Miyamoto, MD;  Location: [facility] OR;  Service: General;  Laterality: Left;   SENTINEL NODE BIOPSY Bilateral 02/09/2022   Procedure: BILATERAL SENTINEL NODE BIOPSY;  Surgeon: Abigail Miyamoto, MD;  Location: [facility] OR;  Service: General;  Laterality: Bilateral;   TOTAL MASTECTOMY Bilateral 02/09/2022   Procedure: BILATERAL TOTAL MASTECTOMY;  Surgeon: Abigail Miyamoto, MD;  Location: [facility] OR;  Service: General;  Laterality: Bilateral;   TUBAL LIGATION      SOCIAL HISTORY: Social History   Socioeconomic History   Marital status: Married    Spouse name: Not on file   Number of children: 2   Years of education: Not on file   Highest education level: Not on file  Occupational History   Not on file  Tobacco Use   Smoking status: Never   Smokeless tobacco: Not on file  Substance and Sexual Activity  Alcohol use: No   Drug use: No   Sexual activity: Not on file  Other Topics Concern   Not on file  Social History Narrative   Not on file   Social Determinants of Health   Financial Resource Strain: Low Risk  (01/30/2022)   Overall Financial Resource Strain (CARDIA)    Difficulty of Paying Living Expenses: Not very hard  Food Insecurity: No Food Insecurity (01/30/2022)   Hunger Vital Sign    Worried About Running Out of Food in the Last Year: Never true    Ran Out of Food in the Last Year: Never true  Transportation Needs: Not on file  Physical Activity: Not on file  Stress: Not on file  Social Connections: Not on file  Intimate Partner Violence: Not on file    FAMILY HISTORY: Family History  Problem Relation Age of Onset   Breast cancer Mother 66 - 33   Colon cancer Mother 52   Breast cancer Sister 62 - 22    Colon cancer Sister 60 - 61   Sleep apnea Brother    Cancer Brother        unknown type, possibly stomach cancer   Thyroid cancer Daughter 81    Review of Systems  Constitutional:  Negative for appetite change, chills, fatigue, fever and unexpected weight change.  HENT:   Negative for hearing loss, lump/mass and trouble swallowing.   Eyes:  Negative for eye problems and icterus.  Respiratory:  Negative for chest tightness, cough and shortness of breath.   Cardiovascular:  Negative for chest pain, leg swelling and palpitations.  Gastrointestinal:  Negative for abdominal distention, abdominal pain, constipation, diarrhea, nausea and vomiting.  Endocrine: Negative for hot flashes.  Genitourinary:  Negative for difficulty urinating.   Musculoskeletal:  Negative for arthralgias.  Skin:  Negative for itching and rash.  Neurological:  Negative for dizziness, extremity weakness, headaches and numbness.  Hematological:  Negative for adenopathy. Does not bruise/bleed easily.  Psychiatric/Behavioral:  Negative for depression. The patient is not nervous/anxious.       PHYSICAL EXAMINATION    Vitals:   07/28/22 1222  BP: (!) 143/67  Pulse: 63  Resp: 16  Temp: (!) 97.5 F (36.4 C)  SpO2: 96%    Physical Exam Constitutional:      General: She is not in acute distress.    Appearance: Normal appearance. She is not toxic-appearing.  HENT:     Head: Normocephalic and atraumatic.     Mouth/Throat:     Mouth: Mucous membranes are moist.     Pharynx: Oropharynx is clear. No oropharyngeal exudate or posterior oropharyngeal erythema.  Eyes:     General: No scleral icterus. Cardiovascular:     Rate and Rhythm: Normal rate and regular rhythm.     Pulses: Normal pulses.     Heart sounds: Normal heart sounds.  Pulmonary:     Effort: Pulmonary effort is normal.     Breath sounds: Normal breath sounds.  Abdominal:     General: Abdomen is flat. Bowel sounds are normal. There is no  distension.     Palpations: Abdomen is soft.     Tenderness: There is no abdominal tenderness.  Musculoskeletal:        General: No swelling.     Cervical back: Neck supple.  Lymphadenopathy:     Cervical: No cervical adenopathy.  Skin:    General: Skin is warm and dry.     Findings: No rash.  Neurological:  General: No focal deficit present.     Mental Status: She is alert.  Psychiatric:        Mood and Affect: Mood normal.        Behavior: Behavior normal.     LABORATORY DATA:  CBC    Component Value Date/Time   WBC 8.0 04/17/2022 0846   WBC 15.7 (H) 02/10/2022 0515   RBC 5.15 (H) 04/17/2022 0846   HGB 13.2 04/17/2022 0846   HCT 42.2 04/17/2022 0846   PLT 261 04/17/2022 0846   MCV 81.9 04/17/2022 0846   MCH 25.6 (L) 04/17/2022 0846   MCHC 31.3 04/17/2022 0846   RDW 14.3 04/17/2022 0846   LYMPHSABS 2.9 04/17/2022 0846   MONOABS 0.8 04/17/2022 0846   EOSABS 0.3 04/17/2022 0846   BASOSABS 0.1 04/17/2022 0846    CMP     Component Value Date/Time   NA 137 04/17/2022 0846   K 3.5 04/17/2022 0846   CL 101 04/17/2022 0846   CO2 28 04/17/2022 0846   GLUCOSE 114 (H) 04/17/2022 0846   BUN 16 04/17/2022 0846   CREATININE 0.77 04/17/2022 0846   CALCIUM 9.0 04/17/2022 0846   PROT 7.8 04/17/2022 0846   ALBUMIN 3.8 04/17/2022 0846   AST 27 04/17/2022 0846   ALT 30 04/17/2022 0846   ALKPHOS 98 04/17/2022 0846   BILITOT 0.6 04/17/2022 0846   GFRNONAA >60 04/17/2022 0846   GFRAA >60 02/07/2015 0439     ASSESSMENT and THERAPY PLAN:   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   She has bilateral breast cancer.  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   Oncotype  of 10, no role for adjuvant chemotherapy. She is tolerating anastrozole extremely well.  She will continue anastrozole.  No concerns on review of systems or physical exam. Mammogram due Dec 2024. I would recommend 5-10 yrs of anti estrogen therapy.   Invasive ductal carcinoma of left breast (HCC) Left breast biopsy showed invasive ductal carcinoma, high-grade in the anterior calcifications, ER/PR and HER2 amplified.   This lesion measured 3 mm. ITC noted in the left axillary LN. Unfortunately progs cannot be done on ITC. She is on Herceptin alone.  She is due for another echo in July and this has been ordered.  No concerns on review of systems and physical exam.  She can return to the clinic every 6 weeks for follow-up.  She gets infusion every 3 weeks. Once again, discussed treatment in general given her bilateral breast cancer, role of herceptin, role of anastrozole, no role for routine imaging and prognosis.   All questions were answered. The patient knows to call the clinic with any problems, questions or concerns. We can certainly see the patient much sooner if necessary.  Total encounter time: 30 minutes*in face-to-face visit time, chart review, lab review, care coordination, order entry, and documentation of the encounter time.   *Total Encounter Time as defined by the Centers for Medicare and Medicaid Services includes, in addition to the face-to-face time of a patient visit (documented in the note above) non-face-to-face time: obtaining and reviewing outside history, ordering and reviewing medications, tests or procedures, care coordination (communications with other health care professionals or caregivers) and documentation in the medical record.

## 2022-07-29 ENCOUNTER — Encounter: Payer: Self-pay | Admitting: Hematology and Oncology

## 2022-07-30 ENCOUNTER — Ambulatory Visit (HOSPITAL_COMMUNITY)
Admission: RE | Admit: 2022-07-30 | Discharge: 2022-07-30 | Disposition: A | Discharge status: Home / Self Care | Payer: Medicare HMO | Source: Ambulatory Visit | Attending: Hematology and Oncology | Admitting: Hematology and Oncology

## 2022-07-30 DIAGNOSIS — Z5181 Encounter for therapeutic drug level monitoring: Secondary | ICD-10-CM | POA: Insufficient documentation

## 2022-07-30 DIAGNOSIS — Z0189 Encounter for other specified special examinations: Secondary | ICD-10-CM

## 2022-07-30 DIAGNOSIS — C50911 Malignant neoplasm of unspecified site of right female breast: Secondary | ICD-10-CM | POA: Diagnosis not present

## 2022-07-30 DIAGNOSIS — C50912 Malignant neoplasm of unspecified site of left female breast: Secondary | ICD-10-CM | POA: Diagnosis not present

## 2022-07-30 DIAGNOSIS — Z79811 Long term (current) use of aromatase inhibitors: Secondary | ICD-10-CM | POA: Diagnosis not present

## 2022-07-30 DIAGNOSIS — I5030 Unspecified diastolic (congestive) heart failure: Secondary | ICD-10-CM | POA: Diagnosis not present

## 2022-07-30 DIAGNOSIS — Z9010 Acquired absence of unspecified breast and nipple: Secondary | ICD-10-CM | POA: Insufficient documentation

## 2022-07-30 DIAGNOSIS — G4730 Sleep apnea, unspecified: Secondary | ICD-10-CM | POA: Diagnosis not present

## 2022-07-30 LAB — ECHOCARDIOGRAM COMPLETE
Area-P 1/2: 3.85 cm2
Calc EF: 63.3 %
S' Lateral: 2.9 cm
Single Plane A2C EF: 67.9 %
Single Plane A4C EF: 62.7 %

## 2022-07-30 NOTE — Progress Notes (Signed)
  Echocardiogram 2D Echocardiogram has been performed.  [name] 07/30/2022, 1:56 PM

## 2022-08-04 ENCOUNTER — Encounter: Payer: Self-pay | Admitting: *Deleted

## 2022-08-11 DIAGNOSIS — Z780 Asymptomatic menopausal state: Secondary | ICD-10-CM | POA: Diagnosis not present

## 2022-08-17 ENCOUNTER — Encounter: Payer: Self-pay | Admitting: *Deleted

## 2022-08-18 ENCOUNTER — Inpatient Hospital Stay: Payer: Medicare HMO

## 2022-08-18 ENCOUNTER — Inpatient Hospital Stay (HOSPITAL_BASED_OUTPATIENT_CLINIC_OR_DEPARTMENT_OTHER): Payer: Medicare HMO | Admitting: Adult Health

## 2022-08-18 ENCOUNTER — Encounter: Payer: Self-pay | Admitting: Adult Health

## 2022-08-18 ENCOUNTER — Other Ambulatory Visit: Payer: Self-pay

## 2022-08-18 ENCOUNTER — Inpatient Hospital Stay: Payer: Medicare HMO | Attending: Hematology and Oncology

## 2022-08-18 VITALS — BP 134/51 | HR 60 | Temp 97.9°F | Resp 17 | Wt 187.0 lb

## 2022-08-18 DIAGNOSIS — C50912 Malignant neoplasm of unspecified site of left female breast: Secondary | ICD-10-CM

## 2022-08-18 DIAGNOSIS — Z5112 Encounter for antineoplastic immunotherapy: Secondary | ICD-10-CM | POA: Diagnosis not present

## 2022-08-18 DIAGNOSIS — Z170 Estrogen receptor positive status [ER+]: Secondary | ICD-10-CM | POA: Diagnosis not present

## 2022-08-18 DIAGNOSIS — Z9012 Acquired absence of left breast and nipple: Secondary | ICD-10-CM | POA: Insufficient documentation

## 2022-08-18 DIAGNOSIS — C50911 Malignant neoplasm of unspecified site of right female breast: Secondary | ICD-10-CM

## 2022-08-18 DIAGNOSIS — C50411 Malignant neoplasm of upper-outer quadrant of right female breast: Secondary | ICD-10-CM | POA: Insufficient documentation

## 2022-08-18 DIAGNOSIS — Z79811 Long term (current) use of aromatase inhibitors: Secondary | ICD-10-CM | POA: Insufficient documentation

## 2022-08-18 DIAGNOSIS — Z95828 Presence of other vascular implants and grafts: Secondary | ICD-10-CM

## 2022-08-18 LAB — CBC WITH DIFFERENTIAL (CANCER CENTER ONLY)
Abs Immature Granulocytes: 0.02 10*3/uL (ref 0.00–0.07)
Basophils Absolute: 0.1 10*3/uL (ref 0.0–0.1)
Basophils Relative: 1 %
Eosinophils Absolute: 0.2 10*3/uL (ref 0.0–0.5)
Eosinophils Relative: 2 %
HCT: 41.7 % (ref 36.0–46.0)
Hemoglobin: 13.3 g/dL (ref 12.0–15.0)
Immature Granulocytes: 0 %
Lymphocytes Relative: 41 %
Lymphs Abs: 3.7 10*3/uL (ref 0.7–4.0)
MCH: 25.5 pg — ABNORMAL LOW (ref 26.0–34.0)
MCHC: 31.9 g/dL (ref 30.0–36.0)
MCV: 80 fL (ref 80.0–100.0)
Monocytes Absolute: 1 10*3/uL (ref 0.1–1.0)
Monocytes Relative: 12 %
Neutro Abs: 4 10*3/uL (ref 1.7–7.7)
Neutrophils Relative %: 44 %
Platelet Count: 285 10*3/uL (ref 150–400)
RBC: 5.21 MIL/uL — ABNORMAL HIGH (ref 3.87–5.11)
RDW: 15.3 % (ref 11.5–15.5)
WBC Count: 9 10*3/uL (ref 4.0–10.5)
nRBC: 0 % (ref 0.0–0.2)

## 2022-08-18 LAB — CMP (CANCER CENTER ONLY)
ALT: 26 U/L (ref 0–44)
AST: 24 U/L (ref 15–41)
Albumin: 4.1 g/dL (ref 3.5–5.0)
Alkaline Phosphatase: 92 U/L (ref 38–126)
Anion gap: 6 (ref 5–15)
BUN: 21 mg/dL (ref 8–23)
CO2: 29 mmol/L (ref 22–32)
Calcium: 9.5 mg/dL (ref 8.9–10.3)
Chloride: 103 mmol/L (ref 98–111)
Creatinine: 0.98 mg/dL (ref 0.44–1.00)
GFR, Estimated: >60 mL/min (ref >60)
Glucose, Bld: 90 mg/dL (ref 70–99)
Potassium: 4 mmol/L (ref 3.5–5.1)
Sodium: 138 mmol/L (ref 135–145)
Total Bilirubin: 0.6 mg/dL (ref 0.3–1.2)
Total Protein: 7.9 g/dL (ref 6.5–8.1)

## 2022-08-18 MED ORDER — SODIUM CHLORIDE 0.9% FLUSH
10.0000 mL | Freq: Once | INTRAVENOUS | Status: AC
  Administered 2022-08-18: 10 mL

## 2022-08-18 MED ORDER — ACETAMINOPHEN 325 MG PO TABS
650.0000 mg | ORAL_TABLET | Freq: Once | ORAL | Status: AC
  Administered 2022-08-18: 650 mg via ORAL
  Filled 2022-08-18: qty 2

## 2022-08-18 MED ORDER — DIPHENHYDRAMINE HCL 25 MG PO CAPS
50.0000 mg | ORAL_CAPSULE | Freq: Once | ORAL | Status: AC
  Administered 2022-08-18: 50 mg via ORAL
  Filled 2022-08-18: qty 2

## 2022-08-18 MED ORDER — HEPARIN SOD (PORK) LOCK FLUSH 100 UNIT/ML IV SOLN
500.0000 [IU] | Freq: Once | INTRAVENOUS | Status: AC | PRN
  Administered 2022-08-18: 500 [IU]

## 2022-08-18 MED ORDER — SODIUM CHLORIDE 0.9% FLUSH
10.0000 mL | INTRAVENOUS | Status: DC | PRN
  Administered 2022-08-18: 10 mL

## 2022-08-18 MED ORDER — SODIUM CHLORIDE 0.9 % IV SOLN: Freq: Once | INTRAVENOUS | Status: AC

## 2022-08-18 MED ORDER — TRASTUZUMAB-ANNS CHEMO 150 MG IV SOLR
6.0000 mg/kg | Freq: Once | INTRAVENOUS | Status: AC
  Administered 2022-08-18: 504 mg via INTRAVENOUS
  Filled 2022-08-18: qty 24

## 2022-08-18 NOTE — Assessment & Plan Note (Signed)
[name] is a 77 year old woman with bilateral breast cancer, stage IA, ER/PR positive bilaterally and HER2+ on the left.  She is here for f/u and evaluation while receiving adjuvant treatment with Herceptin and Anastrozole.  Treatment Plan:  Bilateral mastectomies and sentinel node biopsy Adjuvant Herceptin x 1 year Adjuvant antiestrogen therapy 5-7 years  Herceptin Toxicities: At risk for heart failure: Echo every 3 months, most recently 07/2022 with normal EF  Anastrozole Toxicities:  Hot flashes: very occasional and tolerable per patient Bone health: We will reach out to [facility] and asked that they fax [place] her bone density testing results.  Will return in 3 weeks for follow-up and her next treatment.  She knows to call for any questions or concerns that may arise between now and her next visit with [place].

## 2022-08-18 NOTE — Patient Instructions (Signed)
[facility]  Discharge Instructions: Thank you for choosing [facility] to provide your oncology and hematology care.   If you have a lab appointment with the Cancer Center, please go directly to the Cancer Center and check in at the registration area.   Wear comfortable clothing and clothing appropriate for easy access to any Portacath or PICC line.   We strive to give you quality time with your provider. You may need to reschedule your appointment if you arrive late (15 or more minutes).  Arriving late affects you and other patients whose appointments are after yours.  Also, if you miss three or more appointments without notifying the office, you may be dismissed from the clinic at the provider's discretion.      For prescription refill requests, have your pharmacy contact our office and allow 72 hours for refills to be completed.    Today you received the following chemotherapy and/or immunotherapy agents: Trastuzumab      To help prevent nausea and vomiting after your treatment, we encourage you to take your nausea medication as directed.  BELOW ARE SYMPTOMS THAT SHOULD BE REPORTED IMMEDIATELY: *FEVER GREATER THAN 100.4 F (38 C) OR HIGHER *CHILLS OR SWEATING *NAUSEA AND VOMITING THAT IS NOT CONTROLLED WITH YOUR NAUSEA MEDICATION *UNUSUAL SHORTNESS OF BREATH *UNUSUAL BRUISING OR BLEEDING *URINARY PROBLEMS (pain or burning when urinating, or frequent urination) *BOWEL PROBLEMS (unusual diarrhea, constipation, pain near the anus) TENDERNESS IN MOUTH AND THROAT WITH OR WITHOUT PRESENCE OF ULCERS (sore throat, sores in mouth, or a toothache) UNUSUAL RASH, SWELLING OR PAIN  UNUSUAL VAGINAL DISCHARGE OR ITCHING   Items with * indicate a potential emergency and should be followed up as soon as possible or go to the Emergency Department if any problems should occur.  Please show the CHEMOTHERAPY ALERT CARD or IMMUNOTHERAPY ALERT CARD at  check-in to the Emergency Department and triage nurse.  Should you have questions after your visit or need to cancel or reschedule your appointment, please contact [facility]  Dept: [phone]  and follow the prompts.  Office hours are 8:00 a.m. to 4:30 p.m. Monday - Friday. Please note that voicemails left after 4:00 p.m. may not be returned until the following business day.  We are closed weekends and major holidays. You have access to a nurse at all times for urgent questions. Please call the main number to the clinic Dept: [phone] and follow the prompts.   For any non-urgent questions, you may also contact your provider using MyChart. We now offer e-Visits for anyone 18 and older to request care online for non-urgent symptoms. For details visit [email or web address].   Also download the MyChart app! Go to the app store, search "MyChart", open the app, select [facility], and log in with your MyChart username and password.  

## 2022-08-18 NOTE — Progress Notes (Signed)
[facility] Cancer Follow up:    Wendy Greathouse, MD 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 This is a highly suspicious finding for which ultrasound-guided biopsy is recommended. Adjacent/nearly contiguous complex cystic and solid mass within the RIGHT breast at the 9:30 o'clock axis, 3 cm from the nipple, measuring 1.3 cm. This is a suspicious finding for which ultrasound-guided biopsy is recommended. Highly suspicious calcifications within the inner LEFT breast, with a segmental distribution, measuring 5.7 cm extent. Recommend 2 site stereotactic biopsy to include the more posterior calcifications and the more anterior/retroareolar calcifications.    12/30/2021 Pathology Results   Left breast needle core biopsy from post extent microcalcs showed IDC, overall grade 3.  Prognostics showed ER 100% positive, strong staining intensity, PR 100% positive, strong staining intensity, Ki 67 of 20%. 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  Second area in the right breast needle core biopsy coil clip also showed intermediate grade invasive mammary carcinoma.  Prognostics showed ER 100% positive strong staining PR 15% positive strong staining Ki-67 of 10% and HER2 negative by FISH.  Coil clip prognostic showed ER 95% positive strong staining PR 95% positive strong staining Ki-67 of 30% and HER2 negative by [facility]   02/09/2022 Surgery   Bilateral mastectomies Right breast: ILC, intermediate grade, 1.5cm, LCIS, DCIS, 1LN identifed in breast tissue and negative, T1c, N0   02/09/2022 Cancer Staging   Staging form: Breast, AJCC 8th Edition - Pathologic stage from 02/09/2022: Stage IA (pT1c, pN0, cM0, G2, ER+, PR+, HER2-) - Signed by Loa Socks, NP on 05/05/2022 Stage prefix: Initial diagnosis Histologic grading system: 3 grade system   03/27/2022 -  Chemotherapy   Patient is on Treatment Plan : BREAST MAINTENANCE Trastuzumab IV (6) or SQ (600) D1 q21d x 13 cycles     03/2022 -  Anti-estrogen oral therapy   Anastrozole   Invasive ductal carcinoma of left breast (HCC)  02/09/2022 Surgery   Bilateral Mastectomies: Left breast mastectomy: IDC, g2, 0.3cm, DCIS, ILC intermediate grade, 0.6cm, LCIS, 1SLN + for ITC, 4 SLN negative for malignancy   02/09/2022 Cancer Staging   Staging form: Breast, AJCC 8th Edition - Pathologic stage from 02/09/2022: Stage IA (pT1b, pN0(i+), cM0, G2, ER+, PR+, HER2+) - Signed by Loa Socks, NP on 05/05/2022 Histologic grading system: 3 grade system   03/27/2022 -  Chemotherapy   Patient is on Treatment Plan : BREAST MAINTENANCE Trastuzumab IV (6) or SQ (600) D1 q21d x 13 cycles     03/2022 -  Anti-estrogen oral therapy   Anastrozole      CURRENT THERAPY: Anastrozole/Herceptin  INTERVAL HISTORY: [name] 77 y.o. female returns for follow-up prior to receiving Herceptin therapy.  Her most recent echocardiogram occurred on July 30, 2022 demonstrating a left ventricular ejection fraction of 60 to 65%.  She is also taking anastrozole daily with good tolerance.  She denies any significant side effects.  She is having no concerns today.   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  MEDICAL HISTORY: Past Medical History:  Diagnosis Date   Asthma    Deafness in left ear    WEARS HEARING AID LEFT EAR   GERD (gastroesophageal reflux disease)    Hypertension    Pneumonia    in past x 2    SURGICAL HISTORY: Past Surgical History:  Procedure Laterality Date   ABDOMINAL HYSTERECTOMY     BREAST BIOPSY Left 12/30/2021   MM LT BREAST BX W LOC DEV 1ST LESION IMAGE BX SPEC STEREO GUIDE 12/30/2021 [facility]   BREAST BIOPSY Right 01/02/2022   [place] RT BREAST BX W LOC DEV 1ST LESION IMG BX SPEC US GUIDE 01/02/2022 [facility]   BREAST BIOPSY Right 01/02/2022   [place] RT BREAST BX W LOC DEV EA ADD LESION IMG BX SPEC US GUIDE 01/02/2022 [facility]   BREAST BIOPSY Left 12/30/2021   MM LT BREAST BX W LOC DEV EA AD LESION IMG BX SPEC STEREO GUIDE 12/30/2021 [facility]   CHOLECYSTECTOMY     EYE SURGERY  2016   bilateral cataract surgery   NEPHRECTOMY Left 02/04/2015   Procedure: OPEN RADICAL NEPHRECTOMY;  Surgeon: Heloise Purpura, MD;  Location: [facility];  Service: Urology;  Laterality: Left;   PORTACATH PLACEMENT Left 02/09/2022   Procedure: INSERTION PORT-A-CATH;   Surgeon: Abigail Miyamoto, MD;  Location: [facility];  Service: General;  Laterality: Left;   SENTINEL NODE BIOPSY Bilateral 02/09/2022   Procedure: BILATERAL SENTINEL NODE BIOPSY;  Surgeon: Abigail Miyamoto, MD;  Location: [facility] OR;  Service: General;  Laterality: Bilateral;   TOTAL MASTECTOMY Bilateral 02/09/2022   Procedure: BILATERAL TOTAL MASTECTOMY;  Surgeon: Abigail Miyamoto, MD;  Location: [facility] OR;  Service: General;  Laterality: Bilateral;   TUBAL LIGATION      SOCIAL HISTORY: Social History   Socioeconomic History   Marital status: Married    Spouse name: Not on file   Number of children: 2   Years of education: Not on file   Highest education level: Not on file  Occupational History   Not on file  Tobacco Use   Smoking status: Never   Smokeless tobacco: Not on file  Substance and Sexual Activity   Alcohol use: No   Drug use: No   Sexual activity: Not on file  Other Topics Concern   Not on file  Social History Narrative   Not on file   Social Determinants of Health   Financial Resource Strain: Low Risk  (01/30/2022)   Overall  Financial Resource Strain (CARDIA)    Difficulty of Paying Living Expenses: Not very hard  Food Insecurity: No Food Insecurity (01/30/2022)   Hunger Vital Sign    Worried About Running Out of Food in the Last Year: Never true    Ran Out of Food in the Last Year: Never true  Transportation Needs: Not on file  Physical Activity: Not on file  Stress: Not on file  Social Connections: Not on file  Intimate Partner Violence: Not on file    FAMILY HISTORY: Family History  Problem Relation Age of Onset   Breast cancer Mother 31 - 93   Colon cancer Mother 64   Breast cancer Sister 44 - 62   Colon cancer Sister 28 - 75   Sleep apnea Brother    Cancer Brother        unknown type, possibly stomach cancer   Thyroid cancer Daughter 56    Review of Systems  Constitutional:  Negative for appetite change, chills, fatigue, fever and unexpected weight  change.  HENT:   Negative for hearing loss, lump/mass and trouble swallowing.   Eyes:  Negative for eye problems and icterus.  Respiratory:  Negative for chest tightness, cough and shortness of breath.   Cardiovascular:  Negative for chest pain, leg swelling and palpitations.  Gastrointestinal:  Negative for abdominal distention, abdominal pain, constipation, diarrhea, nausea and vomiting.  Endocrine: Negative for hot flashes.  Genitourinary:  Negative for difficulty urinating.   Musculoskeletal:  Negative for arthralgias.  Skin:  Negative for itching and rash.  Neurological:  Negative for dizziness, extremity weakness, headaches and numbness.  Hematological:  Negative for adenopathy. Does not bruise/bleed easily.  Psychiatric/Behavioral:  Negative for depression. The patient is not nervous/anxious.       PHYSICAL EXAMINATION    Vitals:   08/18/22 1223  BP: (!) 134/51  Pulse: 60  Resp: 17  Temp: 97.9 F (36.6 C)  SpO2: 96%    Physical Exam Constitutional:      General: She is not in acute distress.    Appearance: Normal appearance. She is not toxic-appearing.  HENT:     Head: Normocephalic and atraumatic.     Mouth/Throat:     Mouth: Mucous membranes are moist.     Pharynx: Oropharynx is clear. No oropharyngeal exudate or posterior oropharyngeal erythema.  Eyes:     General: No scleral icterus. Cardiovascular:     Rate and Rhythm: Normal rate and regular rhythm.     Pulses: Normal pulses.     Heart sounds: Normal heart sounds.  Pulmonary:     Effort: Pulmonary effort is normal.     Breath sounds: Normal breath sounds.  Abdominal:     General: Abdomen is flat. Bowel sounds are normal. There is no distension.     Palpations: Abdomen is soft.     Tenderness: There is no abdominal tenderness.  Musculoskeletal:        General: No swelling.     Cervical back: Neck supple.  Lymphadenopathy:     Cervical: No cervical adenopathy.  Skin:    General: Skin is warm and  dry.     Findings: No rash.  Neurological:     General: No focal deficit present.     Mental Status: She is alert.  Psychiatric:        Mood and Affect: Mood normal.        Behavior: Behavior normal.     LABORATORY DATA:  CBC    Component Value  Date/Time   WBC 9.0 08/18/2022 1146   WBC 15.7 (H) 02/10/2022 0515   RBC 5.21 (H) 08/18/2022 1146   HGB 13.3 08/18/2022 1146   HCT 41.7 08/18/2022 1146   PLT 285 08/18/2022 1146   MCV 80.0 08/18/2022 1146   MCH 25.5 (L) 08/18/2022 1146   MCHC 31.9 08/18/2022 1146   RDW 15.3 08/18/2022 1146   LYMPHSABS 3.7 08/18/2022 1146   MONOABS 1.0 08/18/2022 1146   EOSABS 0.2 08/18/2022 1146   BASOSABS 0.1 08/18/2022 1146    CMP     Component Value Date/Time   NA 138 08/18/2022 1146   K 4.0 08/18/2022 1146   CL 103 08/18/2022 1146   CO2 29 08/18/2022 1146   GLUCOSE 90 08/18/2022 1146   BUN 21 08/18/2022 1146   CREATININE 0.98 08/18/2022 1146   CALCIUM 9.5 08/18/2022 1146   PROT 7.9 08/18/2022 1146   ALBUMIN 4.1 08/18/2022 1146   AST 24 08/18/2022 1146   ALT 26 08/18/2022 1146   ALKPHOS 92 08/18/2022 1146   BILITOT 0.6 08/18/2022 1146   GFRNONAA >60 08/18/2022 1146   GFRAA >60 02/07/2015 0439      ASSESSMENT and THERAPY PLAN:   Bilateral breast cancer (HCC) [name] is a 77 year old woman with bilateral breast cancer, stage IA, ER/PR positive bilaterally and HER2+ on the left.  She is here for f/u and evaluation while receiving adjuvant treatment with Herceptin and Anastrozole.  Treatment Plan:  Bilateral mastectomies and sentinel node biopsy Adjuvant Herceptin x 1 year Adjuvant antiestrogen therapy 5-7 years  Herceptin Toxicities: At risk for heart failure: Echo every 3 months, most recently 07/2022 with normal EF  Anastrozole Toxicities:  Hot flashes: very occasional and tolerable per patient Bone health: We will reach out to [employer] and asked that they fax [place] her bone density testing  results.  Will return in 3 weeks for follow-up and her next treatment.  She knows to call for any questions or concerns that may arise between now and her next visit with [place].  All questions were answered. The patient knows to call the clinic with any problems, questions or concerns. We can certainly see the patient much sooner if necessary.  Total encounter time:30 minutes*in face-to-face visit time, chart review, lab review, care coordination, order entry, and documentation of the encounter time.  Lillard Anes, NP 08/18/22 12:55 PM Medical Oncology and Hematology [facility] [address] [place], [place] [address] Tel. [phone]    Fax. [phone]  *Total Encounter Time as defined by the Centers for Medicare and Medicaid Services includes, in addition to the face-to-face time of a patient visit (documented in the note above) non-face-to-face time: obtaining and reviewing outside history, ordering and reviewing medications, tests or procedures, care coordination (communications with other health care professionals or caregivers) and documentation in the medical record.

## 2022-08-19 ENCOUNTER — Other Ambulatory Visit: Payer: Self-pay

## 2022-08-22 ENCOUNTER — Other Ambulatory Visit: Payer: Self-pay

## 2022-08-24 ENCOUNTER — Encounter: Payer: Self-pay | Admitting: *Deleted

## 2022-08-24 ENCOUNTER — Encounter: Payer: Self-pay | Admitting: Internal Medicine

## 2022-08-26 DIAGNOSIS — I10 Essential (primary) hypertension: Secondary | ICD-10-CM | POA: Diagnosis not present

## 2022-08-26 DIAGNOSIS — G4733 Obstructive sleep apnea (adult) (pediatric): Secondary | ICD-10-CM | POA: Diagnosis not present

## 2022-09-04 DIAGNOSIS — Z800 Family history of malignant neoplasm of digestive organs: Secondary | ICD-10-CM | POA: Diagnosis not present

## 2022-09-04 DIAGNOSIS — Z09 Encounter for follow-up examination after completed treatment for conditions other than malignant neoplasm: Secondary | ICD-10-CM | POA: Diagnosis not present

## 2022-09-04 DIAGNOSIS — Z86010 Personal history of colonic polyps: Secondary | ICD-10-CM | POA: Diagnosis not present

## 2022-09-04 DIAGNOSIS — K5730 Diverticulosis of large intestine without perforation or abscess without bleeding: Secondary | ICD-10-CM | POA: Diagnosis not present

## 2022-09-04 DIAGNOSIS — K635 Polyp of colon: Secondary | ICD-10-CM | POA: Diagnosis not present

## 2022-09-04 DIAGNOSIS — D128 Benign neoplasm of rectum: Secondary | ICD-10-CM | POA: Diagnosis not present

## 2022-09-06 ENCOUNTER — Other Ambulatory Visit: Payer: Self-pay

## 2022-09-08 ENCOUNTER — Inpatient Hospital Stay: Payer: Medicare HMO

## 2022-09-08 ENCOUNTER — Other Ambulatory Visit: Payer: Self-pay

## 2022-09-08 ENCOUNTER — Inpatient Hospital Stay (HOSPITAL_BASED_OUTPATIENT_CLINIC_OR_DEPARTMENT_OTHER): Payer: Medicare HMO | Admitting: Adult Health

## 2022-09-08 VITALS — BP 143/63 | HR 64 | Temp 97.5°F | Resp 16 | Wt 186.4 lb

## 2022-09-08 DIAGNOSIS — D128 Benign neoplasm of rectum: Secondary | ICD-10-CM | POA: Diagnosis not present

## 2022-09-08 DIAGNOSIS — C50911 Malignant neoplasm of unspecified site of right female breast: Secondary | ICD-10-CM

## 2022-09-08 DIAGNOSIS — C50411 Malignant neoplasm of upper-outer quadrant of right female breast: Secondary | ICD-10-CM | POA: Diagnosis not present

## 2022-09-08 DIAGNOSIS — Z170 Estrogen receptor positive status [ER+]: Secondary | ICD-10-CM

## 2022-09-08 DIAGNOSIS — Z95828 Presence of other vascular implants and grafts: Secondary | ICD-10-CM

## 2022-09-08 DIAGNOSIS — Z79811 Long term (current) use of aromatase inhibitors: Secondary | ICD-10-CM | POA: Diagnosis not present

## 2022-09-08 DIAGNOSIS — C50912 Malignant neoplasm of unspecified site of left female breast: Secondary | ICD-10-CM

## 2022-09-08 DIAGNOSIS — K635 Polyp of colon: Secondary | ICD-10-CM | POA: Diagnosis not present

## 2022-09-08 DIAGNOSIS — Z5112 Encounter for antineoplastic immunotherapy: Secondary | ICD-10-CM | POA: Diagnosis not present

## 2022-09-08 DIAGNOSIS — Z9012 Acquired absence of left breast and nipple: Secondary | ICD-10-CM | POA: Diagnosis not present

## 2022-09-08 LAB — CMP (CANCER CENTER ONLY)
ALT: 22 U/L (ref 0–44)
AST: 21 U/L (ref 15–41)
Albumin: 4.1 g/dL (ref 3.5–5.0)
Alkaline Phosphatase: 95 U/L (ref 38–126)
Anion gap: 6 (ref 5–15)
BUN: 16 mg/dL (ref 8–23)
CO2: 30 mmol/L (ref 22–32)
Calcium: 9.9 mg/dL (ref 8.9–10.3)
Chloride: 103 mmol/L (ref 98–111)
Creatinine: 0.89 mg/dL (ref 0.44–1.00)
GFR, Estimated: >60 mL/min (ref >60)
Glucose, Bld: 90 mg/dL (ref 70–99)
Potassium: 3.9 mmol/L (ref 3.5–5.1)
Sodium: 139 mmol/L (ref 135–145)
Total Bilirubin: 0.5 mg/dL (ref 0.3–1.2)
Total Protein: 8 g/dL (ref 6.5–8.1)

## 2022-09-08 LAB — CBC WITH DIFFERENTIAL (CANCER CENTER ONLY)
Abs Immature Granulocytes: 0.02 10*3/uL (ref 0.00–0.07)
Basophils Absolute: 0.1 10*3/uL (ref 0.0–0.1)
Basophils Relative: 1 %
Eosinophils Absolute: 0.3 10*3/uL (ref 0.0–0.5)
Eosinophils Relative: 3 %
HCT: 42.7 % (ref 36.0–46.0)
Hemoglobin: 13.2 g/dL (ref 12.0–15.0)
Immature Granulocytes: 0 %
Lymphocytes Relative: 42 %
Lymphs Abs: 3.9 10*3/uL (ref 0.7–4.0)
MCH: 25.2 pg — ABNORMAL LOW (ref 26.0–34.0)
MCHC: 30.9 g/dL (ref 30.0–36.0)
MCV: 81.6 fL (ref 80.0–100.0)
Monocytes Absolute: 1.1 10*3/uL — ABNORMAL HIGH (ref 0.1–1.0)
Monocytes Relative: 12 %
Neutro Abs: 4 10*3/uL (ref 1.7–7.7)
Neutrophils Relative %: 42 %
Platelet Count: 263 10*3/uL (ref 150–400)
RBC: 5.23 MIL/uL — ABNORMAL HIGH (ref 3.87–5.11)
RDW: 15.1 % (ref 11.5–15.5)
WBC Count: 9.3 10*3/uL (ref 4.0–10.5)
nRBC: 0 % (ref 0.0–0.2)

## 2022-09-08 MED ORDER — ACETAMINOPHEN 325 MG PO TABS
650.0000 mg | ORAL_TABLET | Freq: Once | ORAL | Status: AC
  Administered 2022-09-08: 650 mg via ORAL
  Filled 2022-09-08: qty 2

## 2022-09-08 MED ORDER — SODIUM CHLORIDE 0.9 % IV SOLN: Freq: Once | INTRAVENOUS | Status: AC

## 2022-09-08 MED ORDER — SODIUM CHLORIDE 0.9% FLUSH
10.0000 mL | Freq: Once | INTRAVENOUS | Status: AC
  Administered 2022-09-08: 10 mL

## 2022-09-08 MED ORDER — TRASTUZUMAB-ANNS CHEMO 150 MG IV SOLR
6.0000 mg/kg | Freq: Once | INTRAVENOUS | Status: AC
  Administered 2022-09-08: 504 mg via INTRAVENOUS
  Filled 2022-09-08: qty 24

## 2022-09-08 MED ORDER — DIPHENHYDRAMINE HCL 25 MG PO CAPS
50.0000 mg | ORAL_CAPSULE | Freq: Once | ORAL | Status: AC
  Administered 2022-09-08: 50 mg via ORAL
  Filled 2022-09-08: qty 2

## 2022-09-08 NOTE — Assessment & Plan Note (Signed)
[name] is a 77 year old woman with bilateral breast cancer, stage IA, ER/PR positive bilaterally and HER2+ on the left.  She is here for f/u and evaluation while receiving adjuvant treatment with Herceptin and Anastrozole.  Treatment Plan:  Bilateral mastectomies and sentinel node biopsy Adjuvant Herceptin x 1 year Adjuvant antiestrogen therapy 5-7 years  Herceptin Toxicities: At risk for heart failure: Echo every 3 months, most recently 07/2022 with normal EF--next echo is due in October 2024.  Anastrozole Toxicities:  Hot flashes: very occasional and tolerable per patient Bone health: Her bone density was normal.  Repeat is recommended in 2 years while taking anastrozole daily.  [name] will continue to return every 3 weeks for labs, f/u, and treatment.

## 2022-09-08 NOTE — Patient Instructions (Signed)
[facility]  Discharge Instructions: Thank you for choosing [facility] to provide your oncology and hematology care.   If you have a lab appointment with the Cancer Center, please go directly to the Cancer Center and check in at the registration area.   Wear comfortable clothing and clothing appropriate for easy access to any Portacath or PICC line.   We strive to give you quality time with your provider. You may need to reschedule your appointment if you arrive late (15 or more minutes).  Arriving late affects you and other patients whose appointments are after yours.  Also, if you miss three or more appointments without notifying the office, you may be dismissed from the clinic at the provider's discretion.      For prescription refill requests, have your pharmacy contact our office and allow 72 hours for refills to be completed.    Today you received the following chemotherapy and/or immunotherapy agents herceptin      To help prevent nausea and vomiting after your treatment, we encourage you to take your nausea medication as directed.  BELOW ARE SYMPTOMS THAT SHOULD BE REPORTED IMMEDIATELY: *FEVER GREATER THAN 100.4 F (38 C) OR HIGHER *CHILLS OR SWEATING *NAUSEA AND VOMITING THAT IS NOT CONTROLLED WITH YOUR NAUSEA MEDICATION *UNUSUAL SHORTNESS OF BREATH *UNUSUAL BRUISING OR BLEEDING *URINARY PROBLEMS (pain or burning when urinating, or frequent urination) *BOWEL PROBLEMS (unusual diarrhea, constipation, pain near the anus) TENDERNESS IN MOUTH AND THROAT WITH OR WITHOUT PRESENCE OF ULCERS (sore throat, sores in mouth, or a toothache) UNUSUAL RASH, SWELLING OR PAIN  UNUSUAL VAGINAL DISCHARGE OR ITCHING   Items with * indicate a potential emergency and should be followed up as soon as possible or go to the Emergency Department if any problems should occur.  Please show the CHEMOTHERAPY ALERT CARD or IMMUNOTHERAPY ALERT CARD at  check-in to the Emergency Department and triage nurse.  Should you have questions after your visit or need to cancel or reschedule your appointment, please contact [facility]  Dept: [phone]  and follow the prompts.  Office hours are 8:00 a.m. to 4:30 p.m. Monday - Friday. Please note that voicemails left after 4:00 p.m. may not be returned until the following business day.  We are closed weekends and major holidays. You have access to a nurse at all times for urgent questions. Please call the main number to the clinic Dept: [phone] and follow the prompts.   For any non-urgent questions, you may also contact your provider using MyChart. We now offer e-Visits for anyone 18 and older to request care online for non-urgent symptoms. For details visit [email or web address].   Also download the MyChart app! Go to the app store, search "MyChart", open the app, select [facility], and log in with your MyChart username and password.   

## 2022-09-08 NOTE — Progress Notes (Signed)
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 This is a highly suspicious finding for which ultrasound-guided biopsy is recommended. Adjacent/nearly contiguous complex cystic and solid mass within the RIGHT breast at the 9:30 o'clock axis, 3 cm from the nipple, measuring 1.3 cm. This is a suspicious finding for which ultrasound-guided biopsy is recommended. Highly suspicious calcifications within the inner LEFT breast, with a segmental distribution, measuring 5.7 cm extent. Recommend 2 site stereotactic biopsy to include the more posterior calcifications and the more anterior/retroareolar calcifications.    12/30/2021 Pathology Results   Left breast needle core biopsy from post extent microcalcs showed IDC, overall grade 3.  Prognostics showed ER 100% positive, strong staining intensity, PR 100% positive, strong staining intensity, Ki 67 of 20%. 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  Second area in the right breast needle core biopsy coil clip also showed intermediate grade invasive mammary carcinoma.  Prognostics showed ER 100% positive strong staining PR 15% positive strong staining Ki-67 of 10% and HER2 negative by FISH.  Coil clip prognostic showed ER 95% positive strong staining PR 95% positive strong staining Ki-67 of 30% and HER2 negative by [facility]   02/09/2022 Surgery   Bilateral mastectomies Right breast: ILC, intermediate grade, 1.5cm, LCIS, DCIS, 1LN identifed in breast tissue and negative, T1c, N0   02/09/2022 Cancer Staging   Staging form: Breast, AJCC 8th Edition - Pathologic stage from 02/09/2022: Stage IA (pT1c, pN0, cM0, G2, ER+, PR+, HER2-) - Signed by Loa Socks, NP on 05/05/2022 Stage prefix: Initial diagnosis Histologic grading system: 3 grade system   03/27/2022 -  Chemotherapy   Patient is on Treatment Plan : BREAST MAINTENANCE Trastuzumab IV (6) or SQ (600) D1 q21d x 13 cycles     03/2022 -  Anti-estrogen oral therapy   Anastrozole   Invasive ductal carcinoma of left breast (HCC)  02/09/2022 Surgery   Bilateral Mastectomies: Left breast mastectomy: IDC, g2, 0.3cm, DCIS, ILC intermediate grade, 0.6cm, LCIS, 1SLN + for ITC, 4 SLN negative for malignancy   02/09/2022 Cancer Staging   Staging form: Breast, AJCC 8th Edition - Pathologic stage from 02/09/2022: Stage IA (pT1b, pN0(i+), cM0, G2, ER+, PR+, HER2+) - Signed by Loa Socks, NP on 05/05/2022 Histologic grading system: 3 grade system   03/27/2022 -  Chemotherapy   Patient is on Treatment Plan : BREAST MAINTENANCE Trastuzumab IV (6) or SQ (600) D1 q21d x 13 cycles     03/2022 -  Anti-estrogen oral therapy   Anastrozole      CURRENT THERAPY: Anastrozole, Herceptin/Perjeta  INTERVAL HISTORY: [name] 77 y.o. female returns for follow-up and evaluation prior to receiving Herceptin Perjeta.  She continues on anastrozole with good tolerance.  We received her bone density results from [facility] that she had completed in July 2024.  This demonstrated an normal bone density.  She has mild fatigue however this is improving.   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  MEDICAL HISTORY: Past Medical History:  Diagnosis Date   Asthma    Deafness in left ear    WEARS HEARING AID LEFT EAR   GERD (gastroesophageal reflux disease)    Hypertension    Pneumonia    in past x 2    SURGICAL HISTORY: Past Surgical History:  Procedure Laterality Date   ABDOMINAL HYSTERECTOMY     BREAST BIOPSY Left 12/30/2021   MM LT BREAST BX W LOC DEV 1ST LESION IMAGE BX SPEC STEREO GUIDE 12/30/2021 [facility]   BREAST BIOPSY Right 01/02/2022   [place] RT BREAST BX W LOC DEV 1ST LESION IMG BX SPEC US GUIDE 01/02/2022 [facility]   BREAST BIOPSY Right 01/02/2022   [place] RT BREAST BX W LOC DEV EA ADD LESION IMG BX SPEC US GUIDE 01/02/2022 [facility]   BREAST BIOPSY Left 12/30/2021   MM LT BREAST BX W LOC DEV EA AD LESION IMG BX SPEC STEREO GUIDE 12/30/2021 [facility]   CHOLECYSTECTOMY     EYE SURGERY  2016   bilateral cataract surgery   NEPHRECTOMY Left 02/04/2015   Procedure: OPEN RADICAL NEPHRECTOMY;  Surgeon: Heloise Purpura, MD;  Location: [facility];  Service: Urology;  Laterality: Left;   PORTACATH PLACEMENT Left 02/09/2022   Procedure:  INSERTION PORT-A-CATH;  Surgeon: Abigail Miyamoto, MD;  Location: [facility] OR;  Service: General;  Laterality: Left;   SENTINEL NODE BIOPSY Bilateral 02/09/2022   Procedure: BILATERAL SENTINEL NODE BIOPSY;  Surgeon: Abigail Miyamoto, MD;  Location: [facility] OR;  Service: General;  Laterality: Bilateral;   TOTAL MASTECTOMY Bilateral 02/09/2022   Procedure: BILATERAL TOTAL MASTECTOMY;  Surgeon: Abigail Miyamoto, MD;  Location: [facility] OR;  Service: General;  Laterality: Bilateral;   TUBAL LIGATION      SOCIAL HISTORY: Social History   Socioeconomic History   Marital status: Married    Spouse name: Not on file   Number of children: 2   Years of education: Not on file   Highest education level: Not on file  Occupational History   Not on file  Tobacco Use   Smoking status: Never   Smokeless tobacco: Not on file  Substance and Sexual Activity   Alcohol use: No   Drug use: No   Sexual activity: Not on file  Other Topics Concern   Not on file  Social History Narrative   Not on file   Social Determinants of Health   Financial Resource Strain: Low Risk  (01/30/2022)  Overall Financial Resource Strain (CARDIA)    Difficulty of Paying Living Expenses: Not very hard  Food Insecurity: No Food Insecurity (01/30/2022)   Hunger Vital Sign    Worried About Running Out of Food in the Last Year: Never true    Ran Out of Food in the Last Year: Never true  Transportation Needs: Not on file  Physical Activity: Not on file  Stress: Not on file  Social Connections: Not on file  Intimate Partner Violence: Not on file    FAMILY HISTORY: Family History  Problem Relation Age of Onset   Breast cancer Mother 24 - 68   Colon cancer Mother 55   Breast cancer Sister 26 - 35   Colon cancer Sister 65 - 61   Sleep apnea Brother    Cancer Brother        unknown type, possibly stomach cancer   Thyroid cancer Daughter 110    Review of Systems  Constitutional:  Positive for fatigue. Negative for appetite change,  chills, fever and unexpected weight change.  HENT:   Negative for hearing loss, lump/mass and trouble swallowing.   Eyes:  Negative for eye problems and icterus.  Respiratory:  Negative for chest tightness, cough and shortness of breath.   Cardiovascular:  Negative for chest pain, leg swelling and palpitations.  Gastrointestinal:  Negative for abdominal distention, abdominal pain, constipation, diarrhea, nausea and vomiting.  Endocrine: Negative for hot flashes.  Genitourinary:  Negative for difficulty urinating.   Musculoskeletal:  Negative for arthralgias.  Skin:  Negative for itching and rash.  Neurological:  Negative for dizziness, extremity weakness, headaches and numbness.  Hematological:  Negative for adenopathy. Does not bruise/bleed easily.  Psychiatric/Behavioral:  Negative for depression. The patient is not nervous/anxious.       PHYSICAL EXAMINATION   Onc Performance Status - 09/08/22 1346       ECOG Perf Status   ECOG Perf Status Fully active, able to carry on all pre-disease performance without restriction      KPS SCALE   KPS % SCORE Able to carry on normal activity, minor s/s of disease             Vitals:   09/08/22 1342  BP: (!) 143/63  Pulse: 64  Resp: 16  Temp: (!) 97.5 F (36.4 C)  SpO2: 96%    Physical Exam Constitutional:      General: She is not in acute distress.    Appearance: Normal appearance. She is not toxic-appearing.  HENT:     Head: Normocephalic and atraumatic.     Mouth/Throat:     Mouth: Mucous membranes are moist.     Pharynx: Oropharynx is clear. No oropharyngeal exudate or posterior oropharyngeal erythema.  Eyes:     General: No scleral icterus. Cardiovascular:     Rate and Rhythm: Normal rate and regular rhythm.     Pulses: Normal pulses.     Heart sounds: Normal heart sounds.  Pulmonary:     Effort: Pulmonary effort is normal.     Breath sounds: Normal breath sounds.  Abdominal:     General: Abdomen is flat. Bowel  sounds are normal. There is no distension.     Palpations: Abdomen is soft.     Tenderness: There is no abdominal tenderness.  Musculoskeletal:        General: No swelling.     Cervical back: Neck supple.  Lymphadenopathy:     Cervical: No cervical adenopathy.  Skin:    General: Skin  is warm and dry.     Findings: No rash.  Neurological:     General: No focal deficit present.     Mental Status: She is alert.  Psychiatric:        Mood and Affect: Mood normal.        Behavior: Behavior normal.     LABORATORY DATA:  CBC    Component Value Date/Time   WBC 9.3 09/08/2022 1318   WBC 15.7 (H) 02/10/2022 0515   RBC 5.23 (H) 09/08/2022 1318   HGB 13.2 09/08/2022 1318   HCT 42.7 09/08/2022 1318   PLT 263 09/08/2022 1318   MCV 81.6 09/08/2022 1318   MCH 25.2 (L) 09/08/2022 1318   MCHC 30.9 09/08/2022 1318   RDW 15.1 09/08/2022 1318   LYMPHSABS 3.9 09/08/2022 1318   MONOABS 1.1 (H) 09/08/2022 1318   EOSABS 0.3 09/08/2022 1318   BASOSABS 0.1 09/08/2022 1318    CMP     Component Value Date/Time   NA 139 09/08/2022 1318   K 3.9 09/08/2022 1318   CL 103 09/08/2022 1318   CO2 30 09/08/2022 1318   GLUCOSE 90 09/08/2022 1318   BUN 16 09/08/2022 1318   CREATININE 0.89 09/08/2022 1318   CALCIUM 9.9 09/08/2022 1318   PROT 8.0 09/08/2022 1318   ALBUMIN 4.1 09/08/2022 1318   AST 21 09/08/2022 1318   ALT 22 09/08/2022 1318   ALKPHOS 95 09/08/2022 1318   BILITOT 0.5 09/08/2022 1318   GFRNONAA >60 09/08/2022 1318   GFRAA >60 02/07/2015 0439      ASSESSMENT and THERAPY PLAN:   Bilateral breast cancer (HCC) [name] is a 77 year old woman with bilateral breast cancer, stage IA, ER/PR positive bilaterally and HER2+ on the left.  She is here for f/u and evaluation while receiving adjuvant treatment with Herceptin and Anastrozole.  Treatment Plan:  Bilateral mastectomies and sentinel node biopsy Adjuvant Herceptin x 1 year Adjuvant antiestrogen therapy 5-7 years  Herceptin  Toxicities: At risk for heart failure: Echo every 3 months, most recently 07/2022 with normal EF--next echo is due in October 2024.  Anastrozole Toxicities:  Hot flashes: very occasional and tolerable per patient Bone health: Her bone density was normal.  Repeat is recommended in 2 years while taking anastrozole daily.  [name] will continue to return every 3 weeks for labs, f/u, and treatment.      All questions were answered. The patient knows to call the clinic with any problems, questions or concerns. We can certainly see the patient much sooner if necessary.  Total encounter time:20 minutes*in face-to-face visit time, chart review, lab review, care coordination, order entry, and documentation of the encounter time.    Lillard Anes, NP 09/08/22 2:20 PM Medical Oncology and Hematology [facility] [address] [place], [place] [address] Tel. [phone]    Fax. [phone]  *Total Encounter Time as defined by the Centers for Medicare and Medicaid Services includes, in addition to the face-to-face time of a patient visit (documented in the note above) non-face-to-face time: obtaining and reviewing outside history, ordering and reviewing medications, tests or procedures, care coordination (communications with other health care professionals or caregivers) and documentation in the medical record.

## 2022-09-09 ENCOUNTER — Encounter: Payer: Self-pay | Admitting: Gastroenterology

## 2022-09-10 ENCOUNTER — Ambulatory Visit: Payer: Medicare HMO | Admitting: Hematology and Oncology

## 2022-09-11 ENCOUNTER — Other Ambulatory Visit: Payer: Self-pay

## 2022-09-14 ENCOUNTER — Other Ambulatory Visit: Payer: Self-pay

## 2022-09-17 DIAGNOSIS — I10 Essential (primary) hypertension: Secondary | ICD-10-CM | POA: Diagnosis not present

## 2022-09-17 DIAGNOSIS — G4733 Obstructive sleep apnea (adult) (pediatric): Secondary | ICD-10-CM | POA: Diagnosis not present

## 2022-09-20 ENCOUNTER — Other Ambulatory Visit: Payer: Self-pay

## 2022-09-26 DIAGNOSIS — I10 Essential (primary) hypertension: Secondary | ICD-10-CM | POA: Diagnosis not present

## 2022-09-26 DIAGNOSIS — G4733 Obstructive sleep apnea (adult) (pediatric): Secondary | ICD-10-CM | POA: Diagnosis not present

## 2022-09-29 ENCOUNTER — Encounter: Payer: Self-pay | Admitting: Adult Health

## 2022-09-29 ENCOUNTER — Inpatient Hospital Stay: Payer: Medicare HMO | Attending: Hematology and Oncology | Admitting: Adult Health

## 2022-09-29 ENCOUNTER — Inpatient Hospital Stay: Payer: Medicare HMO

## 2022-09-29 VITALS — BP 138/58 | HR 58 | Temp 98.8°F | Resp 18 | Ht 60.75 in | Wt 189.6 lb

## 2022-09-29 DIAGNOSIS — Z9013 Acquired absence of bilateral breasts and nipples: Secondary | ICD-10-CM | POA: Diagnosis not present

## 2022-09-29 DIAGNOSIS — Z170 Estrogen receptor positive status [ER+]: Secondary | ICD-10-CM | POA: Diagnosis not present

## 2022-09-29 DIAGNOSIS — C50912 Malignant neoplasm of unspecified site of left female breast: Secondary | ICD-10-CM

## 2022-09-29 DIAGNOSIS — Z79811 Long term (current) use of aromatase inhibitors: Secondary | ICD-10-CM | POA: Insufficient documentation

## 2022-09-29 DIAGNOSIS — C50411 Malignant neoplasm of upper-outer quadrant of right female breast: Secondary | ICD-10-CM | POA: Diagnosis present

## 2022-09-29 DIAGNOSIS — C50911 Malignant neoplasm of unspecified site of right female breast: Secondary | ICD-10-CM | POA: Diagnosis not present

## 2022-09-29 DIAGNOSIS — Z5112 Encounter for antineoplastic immunotherapy: Secondary | ICD-10-CM | POA: Diagnosis present

## 2022-09-29 MED ORDER — ACETAMINOPHEN 325 MG PO TABS
650.0000 mg | ORAL_TABLET | Freq: Once | ORAL | Status: AC
  Administered 2022-09-29: 650 mg via ORAL
  Filled 2022-09-29: qty 2

## 2022-09-29 MED ORDER — SODIUM CHLORIDE 0.9 % IV SOLN: Freq: Once | INTRAVENOUS | Status: AC

## 2022-09-29 MED ORDER — TRASTUZUMAB-ANNS CHEMO 150 MG IV SOLR
6.0000 mg/kg | Freq: Once | INTRAVENOUS | Status: AC
  Administered 2022-09-29: 504 mg via INTRAVENOUS
  Filled 2022-09-29: qty 24

## 2022-09-29 MED ORDER — SODIUM CHLORIDE 0.9% FLUSH
10.0000 mL | INTRAVENOUS | Status: DC | PRN
  Administered 2022-09-29: 10 mL

## 2022-09-29 MED ORDER — HEPARIN SOD (PORK) LOCK FLUSH 100 UNIT/ML IV SOLN
500.0000 [IU] | Freq: Once | INTRAVENOUS | Status: AC | PRN
  Administered 2022-09-29: 500 [IU]

## 2022-09-29 MED ORDER — DIPHENHYDRAMINE HCL 25 MG PO CAPS
50.0000 mg | ORAL_CAPSULE | Freq: Once | ORAL | Status: AC
  Administered 2022-09-29: 50 mg via ORAL
  Filled 2022-09-29: qty 2

## 2022-09-29 NOTE — Progress Notes (Unsigned)
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 This is a highly suspicious finding for which ultrasound-guided biopsy is recommended. Adjacent/nearly contiguous complex cystic and solid mass within the RIGHT breast at the 9:30 o'clock axis, 3 cm from the nipple, measuring 1.3 cm. This is a suspicious finding for which ultrasound-guided biopsy is recommended. Highly suspicious calcifications within the inner LEFT breast, with a segmental distribution, measuring 5.7 cm extent. Recommend 2 site stereotactic biopsy to include the more posterior calcifications and the more anterior/retroareolar calcifications.    12/30/2021 Pathology Results   Left breast needle core biopsy from post extent microcalcs showed IDC, overall grade 3.  Prognostics showed ER 100% positive, strong staining intensity, PR 100% positive, strong staining intensity, Ki 67 of 20%. 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  Second area in the right breast needle core biopsy coil clip also showed intermediate grade invasive mammary carcinoma.  Prognostics showed ER 100% positive strong staining PR 15% positive strong staining Ki-67 of 10% and HER2 negative by FISH.  Coil clip prognostic showed ER 95% positive strong staining PR 95% positive strong staining Ki-67 of 30% and HER2 negative by [facility]   02/09/2022 Surgery   Bilateral mastectomies Right breast: ILC, intermediate grade, 1.5cm, LCIS, DCIS, 1LN identifed in breast tissue and negative, T1c, N0   02/09/2022 Cancer Staging   Staging form: Breast, AJCC 8th Edition - Pathologic stage from 02/09/2022: Stage IA (pT1c, pN0, cM0, G2, ER+, PR+, HER2-) - Signed by Loa Socks, NP on 05/05/2022 Stage prefix: Initial diagnosis Histologic grading system: 3 grade system   03/27/2022 -  Chemotherapy   Patient is on Treatment Plan : BREAST MAINTENANCE Trastuzumab IV (6) or SQ (600) D1 q21d x 13 cycles     03/2022 -  Anti-estrogen oral therapy   Anastrozole   Invasive ductal carcinoma of left breast (HCC)  02/09/2022 Surgery   Bilateral Mastectomies: Left breast mastectomy: IDC, g2, 0.3cm, DCIS, ILC intermediate grade, 0.6cm, LCIS, 1SLN + for ITC, 4 SLN negative for malignancy   02/09/2022 Cancer Staging   Staging form: Breast, AJCC 8th Edition - Pathologic stage from 02/09/2022: Stage IA (pT1b, pN0(i+), cM0, G2, ER+, PR+, HER2+) - Signed by Loa Socks, NP on 05/05/2022 Histologic grading system: 3 grade system   03/27/2022 -  Chemotherapy  Patient is on Treatment Plan : BREAST MAINTENANCE Trastuzumab IV (6) or SQ (600) D1 q21d x 13 cycles     03/2022 -  Anti-estrogen oral therapy   Anastrozole      CURRENT THERAPY: Anastrozole/Herceptin  INTERVAL HISTORY: [name] 77 y.o. 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  MEDICAL HISTORY: Past Medical History:  Diagnosis Date   Asthma    Deafness in left ear    WEARS HEARING AID LEFT EAR   GERD (gastroesophageal reflux disease)    Hypertension    Pneumonia    in past x 2    SURGICAL HISTORY: Past Surgical History:  Procedure Laterality Date   ABDOMINAL HYSTERECTOMY     BREAST BIOPSY Left 12/30/2021   MM LT BREAST BX W LOC DEV 1ST LESION IMAGE BX SPEC STEREO GUIDE 12/30/2021 [facility]   BREAST BIOPSY Right 01/02/2022   [place] RT BREAST BX W LOC DEV 1ST LESION IMG BX SPEC US GUIDE 01/02/2022 [facility]   BREAST BIOPSY Right 01/02/2022   [place] RT BREAST BX W LOC DEV EA ADD LESION IMG BX SPEC US GUIDE 01/02/2022 [facility]   BREAST BIOPSY Left 12/30/2021   MM LT BREAST BX W LOC DEV EA AD LESION IMG BX SPEC STEREO GUIDE 12/30/2021 [facility]   CHOLECYSTECTOMY     EYE SURGERY  2016   bilateral cataract surgery   NEPHRECTOMY Left 02/04/2015   Procedure: OPEN RADICAL NEPHRECTOMY;  Surgeon: Heloise Purpura, MD;  Location: [facility];  Service: Urology;  Laterality: Left;   PORTACATH PLACEMENT Left 02/09/2022   Procedure: INSERTION PORT-A-CATH;  Surgeon: Abigail Miyamoto, MD;  Location: [facility] OR;  Service: General;  Laterality: Left;   SENTINEL NODE BIOPSY Bilateral 02/09/2022   Procedure: BILATERAL SENTINEL NODE BIOPSY;  Surgeon: Abigail Miyamoto, MD;  Location: [facility];  Service: General;  Laterality:  Bilateral;   TOTAL MASTECTOMY Bilateral 02/09/2022   Procedure: BILATERAL TOTAL MASTECTOMY;  Surgeon: Abigail Miyamoto, MD;  Location: [facility];  Service: General;  Laterality: Bilateral;   TUBAL LIGATION      SOCIAL HISTORY: Social History   Socioeconomic History   Marital status: Married    Spouse name: Not on file   Number of children: 2   Years of education: Not on file   Highest education level: Not on file  Occupational History   Not on file  Tobacco Use   Smoking status: Never   Smokeless tobacco: Not on file  Substance and Sexual Activity   Alcohol use: No   Drug use: No   Sexual activity: Not on file  Other Topics Concern   Not on file  Social History Narrative   Not on file   Social Determinants of Health   Financial Resource Strain: Low Risk  (01/30/2022)   Overall Financial Resource Strain (CARDIA)    Difficulty of Paying Living Expenses: Not very hard  Food Insecurity: No Food Insecurity (01/30/2022)   Hunger Vital Sign    Worried About Running Out of Food in the Last Year: Never true    Ran Out of Food in  the Last Year: Never true  Transportation Needs: Not on file  Physical Activity: Not on file  Stress: Not on file  Social Connections: Not on file  Intimate Partner Violence: Not on file    FAMILY HISTORY: Family History  Problem Relation Age of Onset   Breast cancer Mother 75 - 104   Colon cancer Mother 53   Breast cancer Sister 86 - 72   Colon cancer Sister 28 - 7   Sleep apnea Brother    Cancer Brother        unknown type, possibly stomach cancer   Thyroid cancer Daughter 106    Review of Systems - Oncology    PHYSICAL EXAMINATION   Onc Performance Status - 09/29/22 1423       KPS SCALE   KPS % SCORE Able to carry on normal activity, minor s/s of disease             Vitals:   09/29/22 1413  BP: (!) 138/58  Pulse: (!) 58  Resp: 18  Temp: 98.8 F (37.1 C)  SpO2: 97%    Physical Exam  LABORATORY DATA:  CBC     Component Value Date/Time   WBC 9.3 09/08/2022 1318   WBC 15.7 (H) 02/10/2022 0515   RBC 5.23 (H) 09/08/2022 1318   HGB 13.2 09/08/2022 1318   HCT 42.7 09/08/2022 1318   PLT 263 09/08/2022 1318   MCV 81.6 09/08/2022 1318   MCH 25.2 (L) 09/08/2022 1318   MCHC 30.9 09/08/2022 1318   RDW 15.1 09/08/2022 1318   LYMPHSABS 3.9 09/08/2022 1318   MONOABS 1.1 (H) 09/08/2022 1318   EOSABS 0.3 09/08/2022 1318   BASOSABS 0.1 09/08/2022 1318    CMP     Component Value Date/Time   NA 139 09/08/2022 1318   K 3.9 09/08/2022 1318   CL 103 09/08/2022 1318   CO2 30 09/08/2022 1318   GLUCOSE 90 09/08/2022 1318   BUN 16 09/08/2022 1318   CREATININE 0.89 09/08/2022 1318   CALCIUM 9.9 09/08/2022 1318   PROT 8.0 09/08/2022 1318   ALBUMIN 4.1 09/08/2022 1318   AST 21 09/08/2022 1318   ALT 22 09/08/2022 1318   ALKPHOS 95 09/08/2022 1318   BILITOT 0.5 09/08/2022 1318   GFRNONAA >60 09/08/2022 1318   GFRAA >60 02/07/2015 0439       PENDING LABS:   RADIOGRAPHIC STUDIES:  No results found.   PATHOLOGY:     ASSESSMENT and THERAPY PLAN:   No problem-specific Assessment & Plan notes found for this encounter.   No orders of the defined types were placed in this encounter.   All questions were answered. The patient knows to call the clinic with any problems, questions or concerns. We can certainly see the patient much sooner if necessary. This note was electronically signed. Noreene Filbert, NP 09/29/2022

## 2022-09-30 ENCOUNTER — Encounter: Payer: Self-pay | Admitting: Hematology and Oncology

## 2022-09-30 NOTE — Assessment & Plan Note (Signed)
[name] is a 77 year old woman with bilateral breast cancer, stage IA, ER/PR positive bilaterally and HER2+ on the left.  She is here for f/u and evaluation while receiving adjuvant treatment with Herceptin and Anastrozole.  Treatment Plan:  Bilateral mastectomies and sentinel node biopsy Adjuvant Herceptin x 1 year Adjuvant antiestrogen therapy 5-7 years  Herceptin Toxicities: At risk for heart failure: Echo every 3 months, most recently 07/2022 with normal EF--next echo is due in October 2024--orders placed today  Anastrozole Toxicities:  Hot flashes: nearly resolved Bone health: Her bone density was normal.  Repeat is recommended in 07/2024 while taking anastrozole daily.  [name] will continue to return every 3 weeks for labs, f/u, and treatment.

## 2022-10-20 ENCOUNTER — Inpatient Hospital Stay: Payer: Medicare HMO | Attending: Hematology and Oncology | Admitting: Hematology and Oncology

## 2022-10-20 ENCOUNTER — Inpatient Hospital Stay: Payer: Medicare HMO

## 2022-10-20 DIAGNOSIS — Z170 Estrogen receptor positive status [ER+]: Secondary | ICD-10-CM | POA: Insufficient documentation

## 2022-10-20 DIAGNOSIS — C50411 Malignant neoplasm of upper-outer quadrant of right female breast: Secondary | ICD-10-CM | POA: Diagnosis present

## 2022-10-20 DIAGNOSIS — C50911 Malignant neoplasm of unspecified site of right female breast: Secondary | ICD-10-CM | POA: Diagnosis not present

## 2022-10-20 DIAGNOSIS — Z9012 Acquired absence of left breast and nipple: Secondary | ICD-10-CM | POA: Diagnosis not present

## 2022-10-20 DIAGNOSIS — Z79811 Long term (current) use of aromatase inhibitors: Secondary | ICD-10-CM | POA: Insufficient documentation

## 2022-10-20 DIAGNOSIS — Z90710 Acquired absence of both cervix and uterus: Secondary | ICD-10-CM | POA: Diagnosis not present

## 2022-10-20 DIAGNOSIS — C50912 Malignant neoplasm of unspecified site of left female breast: Secondary | ICD-10-CM | POA: Insufficient documentation

## 2022-10-20 DIAGNOSIS — Z5112 Encounter for antineoplastic immunotherapy: Secondary | ICD-10-CM | POA: Insufficient documentation

## 2022-10-20 LAB — CMP (CANCER CENTER ONLY)
ALT: 20 U/L (ref 0–44)
AST: 20 U/L (ref 15–41)
Albumin: 4.1 g/dL (ref 3.5–5.0)
Alkaline Phosphatase: 94 U/L (ref 38–126)
Anion gap: 6 (ref 5–15)
BUN: 15 mg/dL (ref 8–23)
CO2: 30 mmol/L (ref 22–32)
Calcium: 9.8 mg/dL (ref 8.9–10.3)
Chloride: 103 mmol/L (ref 98–111)
Creatinine: 1.04 mg/dL — ABNORMAL HIGH (ref 0.44–1.00)
GFR, Estimated: 56 mL/min — ABNORMAL LOW (ref >60)
Glucose, Bld: 91 mg/dL (ref 70–99)
Potassium: 4.1 mmol/L (ref 3.5–5.1)
Sodium: 139 mmol/L (ref 135–145)
Total Bilirubin: 0.5 mg/dL (ref 0.3–1.2)
Total Protein: 7.8 g/dL (ref 6.5–8.1)

## 2022-10-20 LAB — CBC WITH DIFFERENTIAL (CANCER CENTER ONLY)
Abs Immature Granulocytes: 0.03 10*3/uL (ref 0.00–0.07)
Basophils Absolute: 0.1 10*3/uL (ref 0.0–0.1)
Basophils Relative: 1 %
Eosinophils Absolute: 0.3 10*3/uL (ref 0.0–0.5)
Eosinophils Relative: 3 %
HCT: 40.6 % (ref 36.0–46.0)
Hemoglobin: 13.2 g/dL (ref 12.0–15.0)
Immature Granulocytes: 0 %
Lymphocytes Relative: 39 %
Lymphs Abs: 4 10*3/uL (ref 0.7–4.0)
MCH: 26.3 pg (ref 26.0–34.0)
MCHC: 32.5 g/dL (ref 30.0–36.0)
MCV: 80.9 fL (ref 80.0–100.0)
Monocytes Absolute: 1 10*3/uL (ref 0.1–1.0)
Monocytes Relative: 10 %
Neutro Abs: 4.8 10*3/uL (ref 1.7–7.7)
Neutrophils Relative %: 47 %
Platelet Count: 275 10*3/uL (ref 150–400)
RBC: 5.02 MIL/uL (ref 3.87–5.11)
RDW: 14.7 % (ref 11.5–15.5)
WBC Count: 10.2 10*3/uL (ref 4.0–10.5)
nRBC: 0 % (ref 0.0–0.2)

## 2022-10-20 MED ORDER — DIPHENHYDRAMINE HCL 25 MG PO CAPS
50.0000 mg | ORAL_CAPSULE | Freq: Once | ORAL | Status: AC
  Administered 2022-10-20: 50 mg via ORAL
  Filled 2022-10-20: qty 2

## 2022-10-20 MED ORDER — HEPARIN SOD (PORK) LOCK FLUSH 100 UNIT/ML IV SOLN
500.0000 [IU] | Freq: Once | INTRAVENOUS | Status: AC | PRN
  Administered 2022-10-20: 500 [IU]

## 2022-10-20 MED ORDER — TRASTUZUMAB-ANNS CHEMO 150 MG IV SOLR
6.0000 mg/kg | Freq: Once | INTRAVENOUS | Status: AC
  Administered 2022-10-20: 504 mg via INTRAVENOUS
  Filled 2022-10-20: qty 24

## 2022-10-20 MED ORDER — SODIUM CHLORIDE 0.9% FLUSH
10.0000 mL | INTRAVENOUS | Status: DC | PRN
  Administered 2022-10-20: 10 mL

## 2022-10-20 MED ORDER — SODIUM CHLORIDE 0.9 % IV SOLN: Freq: Once | INTRAVENOUS | Status: AC

## 2022-10-20 MED ORDER — ACETAMINOPHEN 325 MG PO TABS
650.0000 mg | ORAL_TABLET | Freq: Once | ORAL | Status: AC
  Administered 2022-10-20: 650 mg via ORAL
  Filled 2022-10-20: qty 2

## 2022-10-20 NOTE — Progress Notes (Signed)
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 This is a highly suspicious finding for which ultrasound-guided biopsy is recommended. Adjacent/nearly contiguous complex cystic and solid mass within the RIGHT breast at the 9:30 o'clock axis, 3 cm from the nipple, measuring 1.3 cm. This is a suspicious finding for which ultrasound-guided biopsy is recommended. Highly suspicious calcifications within the inner LEFT breast, with a segmental distribution, measuring 5.7 cm extent. Recommend 2 site stereotactic biopsy to include the more posterior calcifications and the more anterior/retroareolar calcifications.    12/30/2021 Pathology Results   Left breast needle core biopsy from post extent microcalcs showed IDC, overall grade 3.  Prognostics showed ER 100% positive, strong staining intensity, PR 100% positive, strong staining intensity, Ki 67 of 20%. 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  Second area in the right breast needle core biopsy coil clip also showed intermediate grade invasive mammary carcinoma.  Prognostics showed ER 100% positive strong staining PR 15% positive strong staining Ki-67 of 10% and HER2 negative by FISH.  Coil clip prognostic showed ER 95% positive strong staining PR 95% positive strong staining Ki-67 of 30% and HER2 negative by [facility]   02/09/2022 Surgery   Bilateral mastectomies Right breast: ILC, intermediate grade, 1.5cm, LCIS, DCIS, 1LN identifed in breast tissue and negative, T1c, N0   02/09/2022 Cancer Staging   Staging form: Breast, AJCC 8th Edition - Pathologic stage from 02/09/2022: Stage IA (pT1c, pN0, cM0, G2, ER+, PR+, HER2-) - Signed by Loa Socks, NP on 05/05/2022 Stage prefix: Initial diagnosis Histologic grading system: 3 grade system   03/27/2022 -  Chemotherapy   Patient is on Treatment Plan : BREAST MAINTENANCE Trastuzumab IV (6) or SQ (600) D1 q21d x 13 cycles     03/2022 -  Anti-estrogen oral therapy   Anastrozole   Invasive ductal carcinoma of left breast (HCC)  02/09/2022 Surgery   Bilateral Mastectomies: Left breast mastectomy: IDC, g2, 0.3cm, DCIS, ILC intermediate grade, 0.6cm, LCIS, 1SLN + for ITC, 4 SLN negative for malignancy   02/09/2022 Cancer Staging   Staging form: Breast, AJCC 8th Edition - Pathologic stage from 02/09/2022: Stage IA (pT1b, pN0(i+), cM0, G2, ER+, PR+, HER2+) - Signed by Loa Socks, NP on 05/05/2022 Histologic grading system: 3 grade system   03/27/2022 -  Chemotherapy   Patient is on Treatment Plan : BREAST MAINTENANCE Trastuzumab IV (6) or SQ (600) D1 q21d x 13 cycles     03/2022 -  Anti-estrogen oral therapy   Anastrozole      CURRENT THERAPY: Anastrozole/Herceptin  INTERVAL HISTORY:  [name] 77 y.o. female returns for f/u on Anastrozole and Herceptin.   The patient, with a history of breast cancer, is currently undergoing treatment with anastrozole and Herceptin. She reports no significant side effects from the treatment, except for mild fatigue. She denies experiencing any chest pain or pressure, and shortness of breath. She is able to perform all her daily activities at home without any difficulty. She is scheduled to complete her Herceptin treatment in February. The patient admits to not engaging in regular physical activity. She is encouraged to gradually increase her daily walking time. She is also taking vitamin D and consumes milk regularly, negating the need for additional calcium supplementation.  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  MEDICAL HISTORY: Past Medical History:  Diagnosis Date   Asthma    Deafness in left ear    WEARS HEARING AID LEFT EAR   GERD (gastroesophageal reflux disease)    Hypertension    Pneumonia    in past x 2    SURGICAL HISTORY: Past Surgical History:  Procedure Laterality Date   ABDOMINAL HYSTERECTOMY     BREAST BIOPSY Left 12/30/2021   MM LT BREAST BX W LOC DEV 1ST LESION IMAGE BX SPEC STEREO GUIDE 12/30/2021 [facility]   BREAST BIOPSY Right 01/02/2022   [place] RT BREAST BX W LOC DEV 1ST LESION IMG BX SPEC US GUIDE 01/02/2022 [facility]   BREAST BIOPSY Right 01/02/2022   [place] RT BREAST BX W LOC DEV EA ADD LESION IMG BX SPEC US GUIDE 01/02/2022 [facility]  MAMMOGRAPHY   BREAST BIOPSY Left 12/30/2021   MM LT BREAST BX W LOC DEV EA AD LESION IMG BX SPEC STEREO GUIDE 12/30/2021 [facility]MO[facility]   CHOLECYSTECTOMY     EYE SURGERY  2016   bilateral cataract surgery   NEPHRECTOMY Left 02/04/2015   Procedure: OPEN RADICAL NEPHRECTOMY;  Surgeon: Heloise Purpura, MD;  Location: [facility];  Service: Urology;  Laterality: Left;   PORTACATH PLACEMENT Left 02/09/2022   Procedure: INSERTION PORT-A-CATH;  Surgeon: Abigail Miyamoto, MD;  [facility];  Service: General;  Laterality: Left;   SENTINEL NODE BIOPSY Bilateral 02/09/2022   Procedure: BILATERAL SENTINEL NODE BIOPSY;  Surgeon: Abigail Miyamoto, MD;  Location: [facility] OR;  Service: General;  Laterality: Bilateral;   TOTAL MASTECTOMY Bilateral 02/09/2022   Procedure: BILATERAL TOTAL MASTECTOMY;  Surgeon: Abigail Miyamoto, MD;  Location: [facility] OR;  Service: General;  Laterality: Bilateral;   TUBAL LIGATION      SOCIAL HISTORY: Social History   Socioeconomic History   Marital status: Married    Spouse name: Not on file   Number of children: 2   Years of education: Not on file   Highest education level: Not on file  Occupational History   Not on file  Tobacco Use   Smoking status: Never   Smokeless  tobacco: Not on file  Substance and Sexual Activity   Alcohol use: No   Drug use: No   Sexual activity: Not on file  Other Topics Concern   Not on file  Social History Narrative   Not on file   Social Determinants of Health   Financial Resource Strain: Low Risk  (01/30/2022)   Overall Financial Resource Strain (CARDIA)    Difficulty of Paying Living Expenses: Not very hard  Food Insecurity: No Food Insecurity (01/30/2022)   Hunger Vital Sign    Worried About Running Out of Food in the Last Year: Never true    Ran Out of Food in the Last Year: Never true  Transportation Needs: Not on file  Physical Activity: Not on file  Stress: Not on file  Social Connections: Not on file  Intimate Partner  Violence: Not on file    FAMILY HISTORY: Family History  Problem Relation Age of Onset   Breast cancer Mother 47 - 10   Colon cancer Mother 84   Breast cancer Sister 48 - 72   Colon cancer Sister 41 - 32   Sleep apnea Brother    Cancer Brother        unknown type, possibly stomach cancer   Thyroid cancer Daughter 50    Review of Systems  Constitutional:  Negative for appetite change, chills, fatigue, fever and unexpected weight change.  HENT:   Negative for hearing loss, lump/mass and trouble swallowing.   Eyes:  Negative for eye problems and icterus.  Respiratory:  Negative for chest tightness, cough and shortness of breath.   Cardiovascular:  Negative for chest pain, leg swelling and palpitations.  Gastrointestinal:  Negative for abdominal distention, abdominal pain, constipation, diarrhea, nausea and vomiting.  Endocrine: Negative for hot flashes.  Genitourinary:  Negative for difficulty urinating.   Musculoskeletal:  Negative for arthralgias.  Skin:  Negative for itching and rash.  Neurological:  Negative for dizziness, extremity weakness, headaches and numbness.  Hematological:  Negative for adenopathy. Does not bruise/bleed easily.  Psychiatric/Behavioral:  Negative for depression. The patient is not nervous/anxious.       PHYSICAL EXAMINATION     Vitals:   10/20/22 1118  BP: (!) 146/53  Pulse: (!) 58  Resp: 18  Temp: 97.7 F (36.5 C)  SpO2: 97%    Physical Exam Constitutional:      General: She is not in acute distress.    Appearance: Normal appearance. She is not toxic-appearing.  HENT:     Head: Normocephalic and atraumatic.     Mouth/Throat:     Mouth: Mucous membranes are moist.     Pharynx: Oropharynx is clear. No oropharyngeal exudate or posterior oropharyngeal erythema.  Eyes:     General: No scleral icterus. Cardiovascular:     Rate and Rhythm: Normal rate and regular rhythm.     Pulses: Normal pulses.     Heart sounds: Normal heart sounds.   Pulmonary:     Effort: Pulmonary effort is normal.     Breath sounds: Normal breath sounds.  Abdominal:     General: Abdomen is flat. Bowel sounds are normal. There is no distension.     Palpations: Abdomen is soft.     Tenderness: There is no abdominal tenderness.  Musculoskeletal:        General: No swelling.     Cervical back: Neck supple.  Lymphadenopathy:     Cervical: No cervical adenopathy.  Skin:    General: Skin is warm and dry.  Findings: No rash.  Neurological:     General: No focal deficit present.     Mental Status: She is alert.  Psychiatric:        Mood and Affect: Mood normal.        Behavior: Behavior normal.     LABORATORY DATA:  CBC    Component Value Date/Time   WBC 10.2 10/20/2022 1101   WBC 15.7 (H) 02/10/2022 0515   RBC 5.02 10/20/2022 1101   HGB 13.2 10/20/2022 1101   HCT 40.6 10/20/2022 1101   PLT 275 10/20/2022 1101   MCV 80.9 10/20/2022 1101   MCH 26.3 10/20/2022 1101   MCHC 32.5 10/20/2022 1101   RDW 14.7 10/20/2022 1101   LYMPHSABS 4.0 10/20/2022 1101   MONOABS 1.0 10/20/2022 1101   EOSABS 0.3 10/20/2022 1101   BASOSABS 0.1 10/20/2022 1101    CMP     Component Value Date/Time   NA 139 10/20/2022 1101   K 4.1 10/20/2022 1101   CL 103 10/20/2022 1101   CO2 30 10/20/2022 1101   GLUCOSE 91 10/20/2022 1101   BUN 15 10/20/2022 1101   CREATININE 1.04 (H) 10/20/2022 1101   CALCIUM 9.8 10/20/2022 1101   PROT 7.8 10/20/2022 1101   ALBUMIN 4.1 10/20/2022 1101   AST 20 10/20/2022 1101   ALT 20 10/20/2022 1101   ALKPHOS 94 10/20/2022 1101   BILITOT 0.5 10/20/2022 1101   GFRNONAA 56 (L) 10/20/2022 1101   GFRAA >60 02/07/2015 0439      ASSESSMENT and THERAPY PLAN:   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   She has bilateral breast cancer.  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   Oncotype of 10, no role for adjuvant chemotherapy. She is tolerating anastrozole extremely well.  She will continue anastrozole.  No concerns on review of systems or physical exam. She had bilateral mastectomy hence no need for mammograms. I would recommend 5-10 yrs of anti estrogen therapy.   Invasive ductal carcinoma of left breast (HCC) Left breast biopsy showed invasive ductal carcinoma, high-grade in the anterior calcifications, ER/PR and HER2 amplified.   This lesion measured 3 mm. ITC noted in the left axillary LN. Unfortunately progs cannot be done on ITC. She is on Herceptin alone.  She is due for another echo in Oct, this has been scheduled. RTC every 3 weeks for infusion and every 6 weeks for FU  Diastolic Dysfunction Mild diastolic dysfunction noted on previous echocardiogram, likely unrelated to Herceptin treatment. -Monitor condition and refer to cardiologist if necessary.  Physical Activity Limited physical activity reported. -Encourage an increase in daily walking by 5 minutes for start and escalate as tolerated.  Bone Health Taking Anastrozole and Vitamin D. Regular milk consumption reported. -Continue current regimen, no additional calcium supplementation needed.  General Health Maintenance -No mammogram needed due to history of bilateral mastectomies. -Next appointment in 6 weeks.    All questions were answered. The patient knows to call the clinic with any problems, questions or concerns. We can certainly see the patient much sooner if necessary.

## 2022-10-20 NOTE — Patient Instructions (Signed)
[place] [facility]  Discharge Instructions: Thank you for choosing [facility] to provide your oncology and hematology care.   If you have a lab appointment with the Cancer Center, please go directly to the Cancer Center and check in at the registration area.   Wear comfortable clothing and clothing appropriate for easy access to any Portacath or PICC line.   We strive to give you quality time with your provider. You may need to reschedule your appointment if you arrive late (15 or more minutes).  Arriving late affects you and other patients whose appointments are after yours.  Also, if you miss three or more appointments without notifying the office, you may be dismissed from the clinic at the provider's discretion.      For prescription refill requests, have your pharmacy contact our office and allow 72 hours for refills to be completed.    Today you received the following chemotherapy and/or immunotherapy agents: Kanjinti.      To help prevent nausea and vomiting after your treatment, we encourage you to take your nausea medication as directed.  BELOW ARE SYMPTOMS THAT SHOULD BE REPORTED IMMEDIATELY: *FEVER GREATER THAN 100.4 F (38 C) OR HIGHER *CHILLS OR SWEATING *NAUSEA AND VOMITING THAT IS NOT CONTROLLED WITH YOUR NAUSEA MEDICATION *UNUSUAL SHORTNESS OF BREATH *UNUSUAL BRUISING OR BLEEDING *URINARY PROBLEMS (pain or burning when urinating, or frequent urination) *BOWEL PROBLEMS (unusual diarrhea, constipation, pain near the anus) TENDERNESS IN MOUTH AND THROAT WITH OR WITHOUT PRESENCE OF ULCERS (sore throat, sores in mouth, or a toothache) UNUSUAL RASH, SWELLING OR PAIN  UNUSUAL VAGINAL DISCHARGE OR ITCHING   Items with * indicate a potential emergency and should be followed up as soon as possible or go to the Emergency Department if any problems should occur.  Please show the CHEMOTHERAPY ALERT CARD or IMMUNOTHERAPY ALERT CARD at  check-in to the Emergency Department and triage nurse.  Should you have questions after your visit or need to cancel or reschedule your appointment, please contact [place] [facility]  Dept: [phone]  and follow the prompts.  Office hours are 8:00 a.m. to 4:30 p.m. Monday - Friday. Please note that voicemails left after 4:00 p.m. may not be returned until the following business day.  We are closed weekends and major holidays. You have access to a nurse at all times for urgent questions. Please call the main number to the clinic Dept: [phone] and follow the prompts.   For any non-urgent questions, you may also contact your provider using MyChart. We now offer e-Visits for anyone 18 and older to request care online for non-urgent symptoms. For details visit [email or web address].   Also download the MyChart app! Go to the app store, search "MyChart", open the app, select [place], and log in with your MyChart username and password.   

## 2022-10-20 NOTE — Assessment & Plan Note (Addendum)
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   She has bilateral breast cancer.  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   Oncotype of 10, no role for adjuvant chemotherapy. She is tolerating anastrozole extremely well.  She will continue anastrozole.  No concerns on review of systems or physical exam. She had bilateral mastectomy hence no need for mammograms. I would recommend 5-10 yrs of anti estrogen therapy.

## 2022-10-20 NOTE — Assessment & Plan Note (Addendum)
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   This lesion measured 3 mm. ITC noted in the left axillary LN. Unfortunately progs cannot be done on ITC. She is on Herceptin alone.  She is due for another echo in Oct, this has been scheduled. RTC every 3 weeks for infusion and every 6 weeks for FU  Diastolic Dysfunction Mild diastolic dysfunction noted on previous echocardiogram, likely unrelated to Herceptin treatment. -Monitor condition and refer to cardiologist if necessary.  Physical Activity Limited physical activity reported. -Encourage an increase in daily walking by 5 minutes for start and escalate as tolerated.  Bone Health Taking Anastrozole and Vitamin D. Regular milk consumption reported. -Continue current regimen, no additional calcium supplementation needed.  General Health Maintenance -No mammogram needed due to history of bilateral mastectomies. -Next appointment in 6 weeks.

## 2022-10-21 DIAGNOSIS — C50911 Malignant neoplasm of unspecified site of right female breast: Secondary | ICD-10-CM | POA: Diagnosis not present

## 2022-10-21 DIAGNOSIS — M1990 Unspecified osteoarthritis, unspecified site: Secondary | ICD-10-CM | POA: Diagnosis not present

## 2022-10-21 DIAGNOSIS — E039 Hypothyroidism, unspecified: Secondary | ICD-10-CM | POA: Diagnosis not present

## 2022-10-21 DIAGNOSIS — J45909 Unspecified asthma, uncomplicated: Secondary | ICD-10-CM | POA: Diagnosis not present

## 2022-10-21 DIAGNOSIS — E785 Hyperlipidemia, unspecified: Secondary | ICD-10-CM | POA: Diagnosis not present

## 2022-10-21 DIAGNOSIS — Z23 Encounter for immunization: Secondary | ICD-10-CM | POA: Diagnosis not present

## 2022-10-21 DIAGNOSIS — Z800 Family history of malignant neoplasm of digestive organs: Secondary | ICD-10-CM | POA: Diagnosis not present

## 2022-10-21 DIAGNOSIS — E559 Vitamin D deficiency, unspecified: Secondary | ICD-10-CM | POA: Diagnosis not present

## 2022-10-21 DIAGNOSIS — C50912 Malignant neoplasm of unspecified site of left female breast: Secondary | ICD-10-CM | POA: Diagnosis not present

## 2022-10-21 DIAGNOSIS — I10 Essential (primary) hypertension: Secondary | ICD-10-CM | POA: Diagnosis not present

## 2022-10-21 DIAGNOSIS — E669 Obesity, unspecified: Secondary | ICD-10-CM | POA: Diagnosis not present

## 2022-10-21 DIAGNOSIS — G4733 Obstructive sleep apnea (adult) (pediatric): Secondary | ICD-10-CM | POA: Diagnosis not present

## 2022-10-26 DIAGNOSIS — I10 Essential (primary) hypertension: Secondary | ICD-10-CM | POA: Diagnosis not present

## 2022-10-26 DIAGNOSIS — G4733 Obstructive sleep apnea (adult) (pediatric): Secondary | ICD-10-CM | POA: Diagnosis not present

## 2022-10-29 ENCOUNTER — Ambulatory Visit (HOSPITAL_COMMUNITY)
Admission: RE | Admit: 2022-10-29 | Discharge: 2022-10-29 | Disposition: A | Discharge status: Home / Self Care | Payer: Medicare HMO | Source: Ambulatory Visit | Attending: Adult Health | Admitting: Adult Health

## 2022-10-29 DIAGNOSIS — I10 Essential (primary) hypertension: Secondary | ICD-10-CM | POA: Diagnosis not present

## 2022-10-29 DIAGNOSIS — Z9221 Personal history of antineoplastic chemotherapy: Secondary | ICD-10-CM | POA: Diagnosis not present

## 2022-10-29 DIAGNOSIS — C50912 Malignant neoplasm of unspecified site of left female breast: Secondary | ICD-10-CM

## 2022-10-29 DIAGNOSIS — D84821 Immunodeficiency due to drugs: Secondary | ICD-10-CM | POA: Diagnosis not present

## 2022-10-29 DIAGNOSIS — Z0189 Encounter for other specified special examinations: Secondary | ICD-10-CM

## 2022-10-29 DIAGNOSIS — C50911 Malignant neoplasm of unspecified site of right female breast: Secondary | ICD-10-CM

## 2022-10-29 LAB — ECHOCARDIOGRAM COMPLETE
Area-P 1/2: 4.41 cm2
Calc EF: 63.2 %
S' Lateral: 3.2 cm
Single Plane A2C EF: 65.2 %
Single Plane A4C EF: 60.8 %

## 2022-10-30 DIAGNOSIS — C50912 Malignant neoplasm of unspecified site of left female breast: Secondary | ICD-10-CM | POA: Diagnosis not present

## 2022-10-30 DIAGNOSIS — C50911 Malignant neoplasm of unspecified site of right female breast: Secondary | ICD-10-CM | POA: Diagnosis not present

## 2022-11-06 ENCOUNTER — Other Ambulatory Visit: Payer: Self-pay | Admitting: *Deleted

## 2022-11-06 DIAGNOSIS — C50911 Malignant neoplasm of unspecified site of right female breast: Secondary | ICD-10-CM

## 2022-11-09 ENCOUNTER — Encounter: Payer: Self-pay | Admitting: *Deleted

## 2022-11-09 NOTE — Progress Notes (Unsigned)
Patient: [name] Date of Birth: 13-Oct-1945  Reason for Visit: Follow up History from: Patient Primary Neurologist: Frances Furbish  ASSESSMENT AND PLAN 77 y.o. year old female   1.  OSA on CPAP  She has superb compliance.  I am going to increase her pressure range from 5-12 to 5-15.  Her current AHI is 8.8.  She has noted improved sleep quality.  Continues with daytime fatigue, potentially related to undergoing treatment for breast cancer.  I will pull a CPAP download in 4 weeks to review the data.  May consider ONO since oxygen desaturations were seen on PSG.  Follow-up in 6 months or sooner if needed.  Addendum 04/18/22: Received note from PCP that her Fitbit was reporting poor sleep habits and questions hypoxia?  Will go ahead and order ONO today  HISTORY OF PRESENT ILLNESS: Today 11/09/22   04/21/22 SS: Here today for initial CPAP visit.  Presented with snoring and daytime somnolence.  Had PSG 02/03/2022 showing mild OSA with at times severe desaturations as low as 79%.  Recommended AutoPap. Is on immunotherapy for breast cancer. Wearing full face mask. Hard to adjust to having mask on her face. Has never even liked wearing her glasses. She does feel like her sleep is better. Not much change to her energy level. In January had double mastectomy, still fatigued from that. Is a mouth breather. Has not felt that she needs more air. Doesn't recall every waking up feeling need more air.  ESS 8  Review of CPAP data 03/21/2022-04/18/2428/30 days at 100%, 100% greater than 4-hour usage.  Average usage 8 hours 7 minutes.  Minimum pressure 5, maximum pressure 12 cm water.  Pressure 95th percentile 11.9, maximum 12.0.  Leak 6.2, AHI 8.8.  HISTORY  10/01/21 Dr. Frances Furbish: I saw your patient, Chanty Baysinger, kind request in the sleep clinic today for initial consultation of her sleep disorder, in particular, concern for underlying obstructive sleep apnea.  The patient is unaccompanied today.  As you know, Ms.  Laconte is a 77 year old right-handed woman with an underlying medical history of left ear hearing loss, hypertension, reflux disease, asthma, allergies, degenerative joint disease and degenerative disc disease, low back pain, history of chest pain, and obesity, who reports snoring and excessive daytime somnolence.  I reviewed your office note from 09/18/2021.  Her Epworth sleepiness score is 11 out of 24, fatigue severity score is 44 out of 63.  She lives with her husband.  She has 2 grown children, 2 grandchildren and 2 step great-grandchildren.  She is a non-smoker and drinks caffeine in the form of coffee, about 2 cups/day and occasional tea, no alcohol currently.  She does watch TV in her bedroom until her husband turns it off.  She is generally in bed by 830 and watches TV for at least an hour or so.  Rise time is around 7 but she often wakes up between 3 and 5 AM and then goes to the sofa to not disturb her husband.  They have a dog in the household, the dog sleeps on a his bed on the floor.  She is a retired Geneticist, molecular.  Her weight has been more or less stable.  Her brother has sleep apnea.  She has nocturia about once or twice per average night and has woken up occasionally with a headache which she attributes to sinus pressure.  She does have allergies and gets allergy shots once a week.  She has asthma and uses Breo inhaler  and she also takes montelukast.  She has a rescue inhaler as well.  Occasionally she will take an over-the-counter PM type medication at night.   REVIEW OF SYSTEMS: Out of a complete 14 system review of symptoms, the patient complains only of the following symptoms, and all other reviewed systems are negative.  See HPI  ALLERGIES: Allergies  Allergen Reactions   Aspirin     Other Reaction(s): nausea (regular aspirin (325mg ) only)   Hydrocodone-Acetaminophen Nausea And Vomiting   Codeine Nausea Only and Rash    HOME MEDICATIONS: Outpatient Medications Prior to Visit   Medication Sig Dispense Refill   albuterol (PROVENTIL HFA;VENTOLIN HFA) 108 (90 Base) MCG/ACT inhaler Inhale 1 puff into the lungs every 6 (six) hours as needed for wheezing or shortness of breath.     amLODipine (NORVASC) 10 MG tablet Take 10 mg by mouth daily.     anastrozole (ARIMIDEX) 1 MG tablet Take 1 tablet (1 mg total) by mouth daily. 90 tablet 3   aspirin EC 81 MG tablet Take 81 mg by mouth daily.     Cholecalciferol (VITAMIN D) 50 MCG (2000 UT) CAPS Take 4,000 Units by mouth daily.     fluticasone furoate-vilanterol (BREO ELLIPTA) 100-25 MCG/ACT AEPB Inhale 1 puff into the lungs daily.     irbesartan-hydrochlorothiazide (AVALIDE) 300-12.5 MG tablet Take 1 tablet by mouth daily.      levothyroxine (SYNTHROID) 50 MCG tablet Take 50 mcg by mouth daily before breakfast.     lidocaine-prilocaine (EMLA) cream Apply topically as needed. 1 each 0   meclizine (ANTIVERT) 25 MG tablet 1 tablet Orally as needed     metoprolol (LOPRESSOR) 50 MG tablet Take 50 mg by mouth 2 (two) times daily.      montelukast (SINGULAIR) 10 MG tablet Take 10 mg by mouth at bedtime.      PRESCRIPTION MEDICATION Inject 1 Units as directed once a week. Allergy shot     rosuvastatin (CRESTOR) 5 MG tablet Take 5 mg by mouth daily.     No facility-administered medications prior to visit.    PAST MEDICAL HISTORY: Past Medical History:  Diagnosis Date   Asthma    Deafness in left ear    WEARS HEARING AID LEFT EAR   GERD (gastroesophageal reflux disease)    Hypertension    Pneumonia    in past x 2    PAST SURGICAL HISTORY: Past Surgical History:  Procedure Laterality Date   ABDOMINAL HYSTERECTOMY     BREAST BIOPSY Left 12/30/2021   MM LT BREAST BX W LOC DEV 1ST LESION IMAGE BX SPEC STEREO GUIDE 12/30/2021 [facility]   BREAST BIOPSY Right 01/02/2022   [place] RT BREAST BX W LOC DEV 1ST LESION IMG BX SPEC US GUIDE 01/02/2022 [facility]   BREAST BIOPSY Right 01/02/2022   [place] RT BREAST BX W LOC DEV  EA ADD LESION IMG BX SPEC US GUIDE 01/02/2022 [facility]   BREAST BIOPSY Left 12/30/2021   MM LT BREAST BX W LOC DEV EA AD LESION IMG BX SPEC STEREO GUIDE 12/30/2021 [facility]   CHOLECYSTECTOMY     EYE SURGERY  2016   bilateral cataract surgery   NEPHRECTOMY Left 02/04/2015   Procedure: OPEN RADICAL NEPHRECTOMY;  Surgeon: Heloise Purpura, MD;  Location: [facility];  Service: Urology;  Laterality: Left;   PORTACATH PLACEMENT Left 02/09/2022   Procedure: INSERTION PORT-A-CATH;  Surgeon: Abigail Miyamoto, MD;  Location: [facility];  Service: General;  Laterality: Left;   SENTINEL  NODE BIOPSY Bilateral 02/09/2022   Procedure: BILATERAL SENTINEL NODE BIOPSY;  Surgeon: Abigail Miyamoto, MD;  Location: [facility] OR;  Service: General;  Laterality: Bilateral;   TOTAL MASTECTOMY Bilateral 02/09/2022   Procedure: BILATERAL TOTAL MASTECTOMY;  Surgeon: Abigail Miyamoto, MD;  Location: [facility] OR;  Service: General;  Laterality: Bilateral;   TUBAL LIGATION      FAMILY HISTORY: Family History  Problem Relation Age of Onset   Breast cancer Mother 30 - 75   Colon cancer Mother 66   Breast cancer Sister 20 - 31   Colon cancer Sister 51 - 69   Sleep apnea Brother    Cancer Brother        unknown type, possibly stomach cancer   Thyroid cancer Daughter 20    SOCIAL HISTORY: Social History   Socioeconomic History   Marital status: Married    Spouse name: Not on file   Number of children: 2   Years of education: Not on file   Highest education level: Not on file  Occupational History   Not on file  Tobacco Use   Smoking status: Never   Smokeless tobacco: Not on file  Substance and Sexual Activity   Alcohol use: No   Drug use: No   Sexual activity: Not on file  Other Topics Concern   Not on file  Social History Narrative   Not on file   Social Determinants of Health   Financial Resource Strain: Low Risk  (01/30/2022)   Overall Financial Resource Strain (CARDIA)    Difficulty of Paying  Living Expenses: Not very hard  Food Insecurity: No Food Insecurity (01/30/2022)   Hunger Vital Sign    Worried About Running Out of Food in the Last Year: Never true    Ran Out of Food in the Last Year: Never true  Transportation Needs: Not on file  Physical Activity: Not on file  Stress: Not on file  Social Connections: Not on file  Intimate Partner Violence: Not on file   PHYSICAL EXAM  There were no vitals filed for this visit.  There is no height or weight on file to calculate BMI.  Generalized: Well developed, in no acute distress  Neurological examination  Mentation: Alert oriented to time, place, history taking. Follows all commands speech and language fluent Cranial nerve II-XII: Pupils were equal round reactive to light. Extraocular movements were full, visual field were full on confrontational test. Facial sensation and strength were normal.  Head turning and shoulder shrug  were normal and symmetric. Motor: The motor testing reveals 5 over 5 strength of all 4 extremities. Good symmetric motor tone is noted throughout.  Sensory: Sensory testing is intact to soft touch on all 4 extremities. No evidence of extinction is noted.  Coordination: Cerebellar testing reveals good finger-nose-finger bilaterally Gait and station: Gait is normal. Reflexes: Deep tendon reflexes are symmetric and normal bilaterally.   DIAGNOSTIC DATA (LABS, IMAGING, TESTING) - I reviewed patient records, labs, notes, testing and imaging myself where available.  Lab Results  Component Value Date   WBC 10.2 10/20/2022   HGB 13.2 10/20/2022   HCT 40.6 10/20/2022   MCV 80.9 10/20/2022   PLT 275 10/20/2022      Component Value Date/Time   NA 139 10/20/2022 1101   K 4.1 10/20/2022 1101   CL 103 10/20/2022 1101   CO2 30 10/20/2022 1101   GLUCOSE 91 10/20/2022 1101   BUN 15 10/20/2022 1101   CREATININE 1.04 (H) 10/20/2022 1101  CALCIUM 9.8 10/20/2022 1101   PROT 7.8 10/20/2022 1101   ALBUMIN 4.1  10/20/2022 1101   AST 20 10/20/2022 1101   ALT 20 10/20/2022 1101   ALKPHOS 94 10/20/2022 1101   BILITOT 0.5 10/20/2022 1101   GFRNONAA 56 (L) 10/20/2022 1101   GFRAA >60 02/07/2015 0439   No results found for: "CHOL", "HDL", "LDLCALC", "LDLDIRECT", "TRIG", "CHOLHDL" No results found for: "HGBA1C" No results found for: "VITAMINB12" No results found for: "TSH"  Margie Ege, AGNP-C, DNP 11/09/2022, 8:50 PM [facility] [address] [place], [place] [address] [phone]

## 2022-11-10 ENCOUNTER — Inpatient Hospital Stay: Payer: Medicare HMO

## 2022-11-10 ENCOUNTER — Encounter: Payer: Self-pay | Admitting: Neurology

## 2022-11-10 ENCOUNTER — Ambulatory Visit: Payer: Medicare HMO | Admitting: Neurology

## 2022-11-10 ENCOUNTER — Ambulatory Visit: Payer: Medicare HMO | Admitting: Hematology and Oncology

## 2022-11-10 VITALS — BP 153/65 | HR 65 | Temp 97.6°F | Resp 16

## 2022-11-10 VITALS — BP 136/68 | HR 78 | Ht 61.0 in | Wt 188.5 lb

## 2022-11-10 DIAGNOSIS — G4733 Obstructive sleep apnea (adult) (pediatric): Secondary | ICD-10-CM | POA: Diagnosis not present

## 2022-11-10 DIAGNOSIS — Z95828 Presence of other vascular implants and grafts: Secondary | ICD-10-CM

## 2022-11-10 DIAGNOSIS — C50912 Malignant neoplasm of unspecified site of left female breast: Secondary | ICD-10-CM

## 2022-11-10 DIAGNOSIS — C50911 Malignant neoplasm of unspecified site of right female breast: Secondary | ICD-10-CM

## 2022-11-10 DIAGNOSIS — Z5112 Encounter for antineoplastic immunotherapy: Secondary | ICD-10-CM | POA: Diagnosis not present

## 2022-11-10 LAB — CMP (CANCER CENTER ONLY)
ALT: 21 U/L (ref 0–44)
AST: 19 U/L (ref 15–41)
Albumin: 4.1 g/dL (ref 3.5–5.0)
Alkaline Phosphatase: 92 U/L (ref 38–126)
Anion gap: 7 (ref 5–15)
BUN: 21 mg/dL (ref 8–23)
CO2: 29 mmol/L (ref 22–32)
Calcium: 9.7 mg/dL (ref 8.9–10.3)
Chloride: 102 mmol/L (ref 98–111)
Creatinine: 0.93 mg/dL (ref 0.44–1.00)
GFR, Estimated: >60 mL/min (ref >60)
Glucose, Bld: 139 mg/dL — ABNORMAL HIGH (ref 70–99)
Potassium: 3.8 mmol/L (ref 3.5–5.1)
Sodium: 138 mmol/L (ref 135–145)
Total Bilirubin: 0.5 mg/dL (ref 0.3–1.2)
Total Protein: 8.1 g/dL (ref 6.5–8.1)

## 2022-11-10 LAB — CBC WITH DIFFERENTIAL (CANCER CENTER ONLY)
Abs Immature Granulocytes: 0.04 10*3/uL (ref 0.00–0.07)
Basophils Absolute: 0 10*3/uL (ref 0.0–0.1)
Basophils Relative: 0 %
Eosinophils Absolute: 0.3 10*3/uL (ref 0.0–0.5)
Eosinophils Relative: 3 %
HCT: 41.6 % (ref 36.0–46.0)
Hemoglobin: 13.2 g/dL (ref 12.0–15.0)
Immature Granulocytes: 0 %
Lymphocytes Relative: 39 %
Lymphs Abs: 3.6 10*3/uL (ref 0.7–4.0)
MCH: 25.9 pg — ABNORMAL LOW (ref 26.0–34.0)
MCHC: 31.7 g/dL (ref 30.0–36.0)
MCV: 81.7 fL (ref 80.0–100.0)
Monocytes Absolute: 0.9 10*3/uL (ref 0.1–1.0)
Monocytes Relative: 9 %
Neutro Abs: 4.4 10*3/uL (ref 1.7–7.7)
Neutrophils Relative %: 49 %
Platelet Count: 280 10*3/uL (ref 150–400)
RBC: 5.09 MIL/uL (ref 3.87–5.11)
RDW: 14.8 % (ref 11.5–15.5)
WBC Count: 9.3 10*3/uL (ref 4.0–10.5)
nRBC: 0 % (ref 0.0–0.2)

## 2022-11-10 MED ORDER — ACETAMINOPHEN 325 MG PO TABS
650.0000 mg | ORAL_TABLET | Freq: Once | ORAL | Status: AC
  Administered 2022-11-10: 650 mg via ORAL
  Filled 2022-11-10: qty 2

## 2022-11-10 MED ORDER — HEPARIN SOD (PORK) LOCK FLUSH 100 UNIT/ML IV SOLN
500.0000 [IU] | Freq: Once | INTRAVENOUS | Status: AC | PRN
  Administered 2022-11-10: 500 [IU]

## 2022-11-10 MED ORDER — SODIUM CHLORIDE 0.9 % IV SOLN
Freq: Once | INTRAVENOUS | Status: AC
Start: 2022-11-10 — End: 2022-11-10

## 2022-11-10 MED ORDER — SODIUM CHLORIDE 0.9 % IV SOLN
6.0000 mg/kg | Freq: Once | INTRAVENOUS | Status: AC
  Administered 2022-11-10: 504 mg via INTRAVENOUS
  Filled 2022-11-10: qty 24

## 2022-11-10 MED ORDER — SODIUM CHLORIDE 0.9% FLUSH
10.0000 mL | Freq: Once | INTRAVENOUS | Status: AC
  Administered 2022-11-10: 10 mL

## 2022-11-10 MED ORDER — DIPHENHYDRAMINE HCL 25 MG PO CAPS
50.0000 mg | ORAL_CAPSULE | Freq: Once | ORAL | Status: AC
  Administered 2022-11-10: 50 mg via ORAL
  Filled 2022-11-10: qty 2

## 2022-11-10 MED ORDER — SODIUM CHLORIDE 0.9% FLUSH
10.0000 mL | INTRAVENOUS | Status: DC | PRN
  Administered 2022-11-10: 10 mL

## 2022-11-10 NOTE — Patient Instructions (Addendum)
I will order overnight pulse oximetry testing to check oxygen at night. Continue nightly CPAP usage.  Recommend minimum of 4 hours nightly utilization.  Continue current settings. Follow up in 1 year.  Orders Placed This Encounter  Procedures   Pulse oximetry, overnight

## 2022-11-11 ENCOUNTER — Encounter: Payer: Self-pay | Admitting: Neurology

## 2022-11-11 DIAGNOSIS — G4733 Obstructive sleep apnea (adult) (pediatric): Secondary | ICD-10-CM

## 2022-11-11 NOTE — Addendum Note (Signed)
Addended by: Glean Salvo on: 11/11/2022 12:36 PM   Modules accepted: Orders

## 2022-11-12 ENCOUNTER — Other Ambulatory Visit: Payer: Self-pay

## 2022-11-23 ENCOUNTER — Emergency Department (HOSPITAL_COMMUNITY)
Admission: EM | Admit: 2022-11-23 | Discharge: 2022-11-24 | Disposition: A | Discharge status: Home / Self Care | Payer: Medicare HMO | Attending: Emergency Medicine | Admitting: Emergency Medicine

## 2022-11-23 ENCOUNTER — Encounter (HOSPITAL_COMMUNITY): Payer: Self-pay | Admitting: Emergency Medicine

## 2022-11-23 ENCOUNTER — Other Ambulatory Visit: Payer: Self-pay

## 2022-11-23 ENCOUNTER — Emergency Department (HOSPITAL_COMMUNITY): Payer: Medicare HMO

## 2022-11-23 DIAGNOSIS — Z7982 Long term (current) use of aspirin: Secondary | ICD-10-CM | POA: Diagnosis not present

## 2022-11-23 DIAGNOSIS — R079 Chest pain, unspecified: Secondary | ICD-10-CM | POA: Diagnosis not present

## 2022-11-23 DIAGNOSIS — R0789 Other chest pain: Secondary | ICD-10-CM | POA: Insufficient documentation

## 2022-11-23 DIAGNOSIS — Z79899 Other long term (current) drug therapy: Secondary | ICD-10-CM | POA: Diagnosis not present

## 2022-11-23 DIAGNOSIS — I10 Essential (primary) hypertension: Secondary | ICD-10-CM | POA: Diagnosis not present

## 2022-11-23 DIAGNOSIS — M546 Pain in thoracic spine: Secondary | ICD-10-CM | POA: Diagnosis not present

## 2022-11-23 DIAGNOSIS — Z853 Personal history of malignant neoplasm of breast: Secondary | ICD-10-CM | POA: Diagnosis not present

## 2022-11-23 DIAGNOSIS — R0602 Shortness of breath: Secondary | ICD-10-CM | POA: Diagnosis not present

## 2022-11-23 DIAGNOSIS — J9811 Atelectasis: Secondary | ICD-10-CM | POA: Diagnosis not present

## 2022-11-23 LAB — BASIC METABOLIC PANEL
Anion gap: 7 (ref 5–15)
BUN: 16 mg/dL (ref 8–23)
CO2: 23 mmol/L (ref 22–32)
Calcium: 9.2 mg/dL (ref 8.9–10.3)
Chloride: 105 mmol/L (ref 98–111)
Creatinine, Ser: 0.88 mg/dL (ref 0.44–1.00)
GFR, Estimated: >60 mL/min (ref >60)
Glucose, Bld: 97 mg/dL (ref 70–99)
Potassium: 3.8 mmol/L (ref 3.5–5.1)
Sodium: 135 mmol/L (ref 135–145)

## 2022-11-23 LAB — CBC
HCT: 42.5 % (ref 36.0–46.0)
Hemoglobin: 13.3 g/dL (ref 12.0–15.0)
MCH: 25.6 pg — ABNORMAL LOW (ref 26.0–34.0)
MCHC: 31.3 g/dL (ref 30.0–36.0)
MCV: 81.7 fL (ref 80.0–100.0)
Platelets: 282 10*3/uL (ref 150–400)
RBC: 5.2 MIL/uL — ABNORMAL HIGH (ref 3.87–5.11)
RDW: 14.6 % (ref 11.5–15.5)
WBC: 10.9 10*3/uL — ABNORMAL HIGH (ref 4.0–10.5)
nRBC: 0 % (ref 0.0–0.2)

## 2022-11-23 LAB — TROPONIN I (HIGH SENSITIVITY): Troponin I (High Sensitivity): 3 ng/L (ref <18)

## 2022-11-23 NOTE — ED Triage Notes (Signed)
Pt in with L posterior rib pain that began yesterday, pain now radiates to L chest. C/o mild sob, denies excessive coughing or fevers. States she has also noticed her HR fluctuating from 50's-110's.

## 2022-11-24 LAB — TROPONIN I (HIGH SENSITIVITY): Troponin I (High Sensitivity): 4 ng/L (ref <18)

## 2022-11-24 LAB — D-DIMER, QUANTITATIVE: D-Dimer, Quant: 0.72 ug{FEU}/mL — ABNORMAL HIGH (ref 0.00–0.50)

## 2022-11-24 MED ORDER — LIDOCAINE 5 % EX PTCH
1.0000 | MEDICATED_PATCH | CUTANEOUS | Status: DC
  Administered 2022-11-24: 1 via TRANSDERMAL
  Filled 2022-11-24: qty 1

## 2022-11-24 MED ORDER — ACETAMINOPHEN 325 MG PO TABS
650.0000 mg | ORAL_TABLET | Freq: Once | ORAL | Status: AC
  Administered 2022-11-24: 650 mg via ORAL
  Filled 2022-11-24: qty 2

## 2022-11-24 MED ORDER — LIDOCAINE 5 % EX PTCH: 1.0000 | MEDICATED_PATCH | CUTANEOUS | 0 refills | Status: AC

## 2022-11-24 NOTE — ED Provider Notes (Signed)
[place] EMERGENCY DEPARTMENT AT [facility] Provider Note   CSN: [id number] Arrival date & time: 11/23/22  2218     History  Chief Complaint  Patient presents with   Chest Pain   Back Pain    [name] is a 77 y.o. female.  The history is provided by the patient.  Chest Pain Pain location:  L chest Pain quality: aching   Pain radiates to:  Does not radiate Pain severity:  Mild Onset quality:  Gradual Duration:  2 days Timing:  Intermittent Progression:  Waxing and waning Chronicity:  New Context: at rest   Relieved by:  Nothing Worsened by:  Nothing Risk factors: hypertension   Risk factors comment:  Breast cancer      Home Medications Prior to Admission medications   Medication Sig Start Date End Date Taking? Authorizing Provider  acetaminophen (TYLENOL) 650 MG CR tablet Take 1,300 mg by mouth every 8 (eight) hours as needed for pain.   Yes [provider]  albuterol (PROVENTIL HFA;VENTOLIN HFA) 108 (90 Base) MCG/ACT inhaler Inhale 1 puff into the lungs every 6 (six) hours as needed for wheezing or shortness of breath.   Yes [provider]  amLODipine (NORVASC) 10 MG tablet Take 10 mg by mouth daily.   Yes [provider]  anastrozole (ARIMIDEX) 1 MG tablet Take 1 tablet (1 mg total) by mouth daily. 03/17/22  Yes Rachel Moulds, MD  aspirin EC 81 MG tablet Take 81 mg by mouth daily.   Yes [provider]  Cholecalciferol (VITAMIN D) 50 MCG (2000 UT) CAPS Take 4,000 Units by mouth daily.   Yes [provider]  fluticasone furoate-vilanterol (BREO ELLIPTA) 100-25 MCG/ACT AEPB Inhale 1 puff into the lungs daily. 04/21/17  Yes [provider]  irbesartan-hydrochlorothiazide (AVALIDE) 300-12.5 MG tablet Take 1 tablet by mouth daily.  12/25/14  Yes [provider]  levothyroxine (SYNTHROID) 50 MCG tablet Take 50 mcg by mouth daily before breakfast. 09/05/21  Yes [provider]   lidocaine (LIDODERM) 5 % Place 1 patch onto the skin daily. Remove & Discard patch within 12 hours or as directed by MD 11/24/22  Yes Efosa Treichler, DO  lidocaine-prilocaine (EMLA) cream Apply topically as needed. 05/26/22  Yes Causey, Larna Daughters, NP  meclizine (ANTIVERT) 25 MG tablet Take 25 mg by mouth 3 (three) times daily as needed for dizziness or nausea.   Yes [provider]  metoprolol (LOPRESSOR) 50 MG tablet Take 50 mg by mouth 2 (two) times daily.  12/25/14  Yes [provider]  montelukast (SINGULAIR) 10 MG tablet Take 10 mg by mouth at bedtime.  01/03/15  Yes [provider]  rosuvastatin (CRESTOR) 5 MG tablet Take 5 mg by mouth daily.   Yes [provider]  PRESCRIPTION MEDICATION Inject 1 Units as directed once a week. Allergy shot Patient not taking: Reported on 11/10/2022    [provider]      Allergies    Aspirin, Hydrocodone-acetaminophen, Levofloxacin, Tramadol, and Codeine    Review of Systems   Review of Systems  Cardiovascular:  Positive for chest pain.    Physical Exam Updated Vital Signs  ED Triage Vitals  Encounter Vitals Group     BP 11/23/22 2226 (!) 154/131     Systolic BP Percentile --      Diastolic BP Percentile --      Pulse Rate 11/23/22 2226 70     Resp 11/23/22 2226 (!) 21  Temp 11/23/22 2226 97.6 F (36.4 C)     Temp src --      SpO2 11/23/22 2226 98 %     Weight 11/23/22 2228 188 lb 7.9 oz (85.5 kg)     Height --      Head Circumference --      Peak Flow --      Pain Score 11/23/22 2228 5     Pain Loc --      Pain Education --      Exclude from Growth Chart --       Physical Exam Vitals and nursing note reviewed.  Constitutional:      General: She is not in acute distress.    Appearance: She is well-developed. She is not ill-appearing.  HENT:     Head: Normocephalic and atraumatic.  Eyes:     Extraocular Movements: Extraocular movements intact.     Conjunctiva/sclera:  Conjunctivae normal.     Pupils: Pupils are equal, round, and reactive to light.  Cardiovascular:     Rate and Rhythm: Normal rate and regular rhythm.     Pulses:          Radial pulses are 2+ on the right side and 2+ on the left side.     Heart sounds: Normal heart sounds. No murmur heard. Pulmonary:     Effort: Pulmonary effort is normal. No respiratory distress.     Breath sounds: Normal breath sounds. No decreased breath sounds, wheezing or rhonchi.  Chest:     Chest wall: Tenderness present.  Abdominal:     Palpations: Abdomen is soft.     Tenderness: There is no abdominal tenderness.  Musculoskeletal:        General: No swelling.     Cervical back: Normal range of motion and neck supple.     Right lower leg: No edema.     Left lower leg: No edema.  Skin:    General: Skin is warm and dry.     Capillary Refill: Capillary refill takes less than 2 seconds.  Neurological:     General: No focal deficit present.     Mental Status: She is alert.  Psychiatric:        Mood and Affect: Mood normal.     ED Results / Procedures / Treatments   Labs (all labs ordered are listed, but only abnormal results are displayed) Labs Reviewed  CBC - Abnormal; Notable for the following components:      Result Value   WBC 10.9 (*)    RBC 5.20 (*)    MCH 25.6 (*)    All other components within normal limits  D-DIMER, QUANTITATIVE - Abnormal; Notable for the following components:   D-Dimer, Quant 0.72 (*)    All other components within normal limits  BASIC METABOLIC PANEL  TROPONIN I (HIGH SENSITIVITY)  TROPONIN I (HIGH SENSITIVITY)    EKG EKG Interpretation Date/Time:  Monday November 23 2022 22:15:09 EST Ventricular Rate:  69 PR Interval:  192 QRS Duration:  90 QT Interval:  408 QTC Calculation: 437 R Axis:   47  Text Interpretation: Normal sinus rhythm Normal ECG When compared with ECG of 05-Feb-2022 08:51, PREVIOUS ECG IS PRESENT Confirmed by Virgina Norfolk [phone]) on  11/24/2022 8:12:26 AM  Radiology DG Chest 2 View  Result Date: 11/24/2022 CLINICAL DATA:  Chest pain and shortness of breath EXAM: CHEST - 2 VIEW COMPARISON:  12/12/2014 FINDINGS: Stable cardiomediastinal silhouette. Left chest wall Port-A-Cath with tip in  the mid SVC. Left basilar atelectasis. Biapical pleural-parenchymal scarring. No pleural effusion or pneumothorax. IMPRESSION: No active cardiopulmonary disease. Electronically Signed   By: Minerva Fester M.D.   On: 11/24/2022 03:42    Procedures Procedures    Medications Ordered in ED Medications  lidocaine (LIDODERM) 5 % 1 patch (1 patch Transdermal Patch Applied 11/24/22 0843)  acetaminophen (TYLENOL) tablet 650 mg (650 mg Oral Given 11/24/22 3474)    ED Course/ Medical Decision Making/ A&P                                 Medical Decision Making Amount and/or Complexity of Data Reviewed Labs: ordered. Radiology: ordered.  Risk OTC drugs. Prescription drug management.   [name] is here with chest pain and back pain.  Mildly hypertensive but otherwise normal vitals.   She is got fairly focal tenderness in the left subscapular area.  She is very well-appearing otherwise.  Neurovascular neuromuscular intact on exam.  She has a history of breast cancer and hypertension.  She is undergoing maintenance chemo at this time.  She had surgery last year.  She is got clear breath sounds.  No infectious symptoms.  She is already had EKG troponin x 2 basic labs and chest x-ray done prior to my evaluation which per my review and interpretation are unremarkable.  Troponins normal x 2.  I have no concern for ACS.  She has no pneumonia pneumothorax on x-ray.  She has no significant electrolyte abnormality or anemia.  Overall given her cancer history we will get a D-dimer to further rule out PE but I do suspect that this is musculoskeletal.  Have no concern for dissection or other acute process at this time.  Will give her lidocaine patch.   She like to try to avoid any sedating meds.  Per my review and interpretation of labs D-dimer is age-adjusted normal.  I do suspect musculoskeletal process.  Recommend Tylenol and ibuprofen and Tylenol.  I do not think she has had dissection or PE or other significant cardiac event.  She is well-appearing.  Overall she understands return precautions.  Discharged in good condition.  This chart was dictated using voice recognition software.  Despite best efforts to proofread,  errors can occur which can change the documentation meaning.         Final Clinical Impression(s) / ED Diagnoses Final diagnoses:  Atypical chest pain  Acute left-sided thoracic back pain    Rx / DC Orders ED Discharge Orders          Ordered    lidocaine (LIDODERM) 5 %  Every 24 hours        11/24/22 1019              Tabrina Esty, DO 11/24/22 1020

## 2022-11-24 NOTE — Discharge Instructions (Signed)
Recommend 1000 mg of Tylenol every 6 hours as needed for pain.  Recommend lidocaine patches either prescription or over-the-counter as well.  Follow-up with your primary care doctor.  Return if symptoms worsen as discussed.

## 2022-11-25 DIAGNOSIS — M1990 Unspecified osteoarthritis, unspecified site: Secondary | ICD-10-CM | POA: Diagnosis not present

## 2022-11-25 DIAGNOSIS — C50912 Malignant neoplasm of unspecified site of left female breast: Secondary | ICD-10-CM | POA: Diagnosis not present

## 2022-11-25 DIAGNOSIS — I10 Essential (primary) hypertension: Secondary | ICD-10-CM | POA: Diagnosis not present

## 2022-11-25 DIAGNOSIS — G4733 Obstructive sleep apnea (adult) (pediatric): Secondary | ICD-10-CM | POA: Diagnosis not present

## 2022-11-25 DIAGNOSIS — C50911 Malignant neoplasm of unspecified site of right female breast: Secondary | ICD-10-CM | POA: Diagnosis not present

## 2022-11-25 DIAGNOSIS — B0223 Postherpetic polyneuropathy: Secondary | ICD-10-CM | POA: Diagnosis not present

## 2022-11-26 ENCOUNTER — Telehealth: Payer: Self-pay | Admitting: *Deleted

## 2022-11-26 DIAGNOSIS — I10 Essential (primary) hypertension: Secondary | ICD-10-CM | POA: Diagnosis not present

## 2022-11-26 DIAGNOSIS — G4733 Obstructive sleep apnea (adult) (pediatric): Secondary | ICD-10-CM | POA: Diagnosis not present

## 2022-11-26 NOTE — Telephone Encounter (Signed)
This RN spoke with pt per her VM stating she has a " very very very very mild area of shingles" and wants to verify not an in issue with her upcoming kanjinti on 11/19.  Pt states area of rash is on her left back shoulder blade and " is very small" and is covered by her clothing.  She is not having any issues with pain.  The pt is scheduled for lab- MD- then treatment on 12/01/2022.  This note will be forwarded to MD for review and inquiry if any concerns for proceeding with treatment.

## 2022-11-30 ENCOUNTER — Other Ambulatory Visit: Payer: Self-pay | Admitting: *Deleted

## 2022-11-30 DIAGNOSIS — C50911 Malignant neoplasm of unspecified site of right female breast: Secondary | ICD-10-CM

## 2022-12-01 ENCOUNTER — Inpatient Hospital Stay: Payer: Medicare HMO

## 2022-12-01 ENCOUNTER — Inpatient Hospital Stay: Payer: Medicare HMO | Attending: Hematology and Oncology | Admitting: Hematology and Oncology

## 2022-12-01 VITALS — BP 154/61 | HR 63 | Temp 97.8°F | Resp 16 | Wt 189.8 lb

## 2022-12-01 DIAGNOSIS — C50411 Malignant neoplasm of upper-outer quadrant of right female breast: Secondary | ICD-10-CM | POA: Diagnosis present

## 2022-12-01 DIAGNOSIS — C50912 Malignant neoplasm of unspecified site of left female breast: Secondary | ICD-10-CM | POA: Diagnosis not present

## 2022-12-01 DIAGNOSIS — C50911 Malignant neoplasm of unspecified site of right female breast: Secondary | ICD-10-CM

## 2022-12-01 DIAGNOSIS — Z170 Estrogen receptor positive status [ER+]: Secondary | ICD-10-CM | POA: Diagnosis not present

## 2022-12-01 DIAGNOSIS — Z95828 Presence of other vascular implants and grafts: Secondary | ICD-10-CM

## 2022-12-01 DIAGNOSIS — Z79811 Long term (current) use of aromatase inhibitors: Secondary | ICD-10-CM | POA: Diagnosis not present

## 2022-12-01 DIAGNOSIS — Z5112 Encounter for antineoplastic immunotherapy: Secondary | ICD-10-CM | POA: Diagnosis present

## 2022-12-01 LAB — CBC WITH DIFFERENTIAL (CANCER CENTER ONLY)
Abs Immature Granulocytes: 0.03 10*3/uL (ref 0.00–0.07)
Basophils Absolute: 0.1 10*3/uL (ref 0.0–0.1)
Basophils Relative: 1 %
Eosinophils Absolute: 0.2 10*3/uL (ref 0.0–0.5)
Eosinophils Relative: 3 %
HCT: 40.7 % (ref 36.0–46.0)
Hemoglobin: 13 g/dL (ref 12.0–15.0)
Immature Granulocytes: 0 %
Lymphocytes Relative: 44 %
Lymphs Abs: 4.1 10*3/uL — ABNORMAL HIGH (ref 0.7–4.0)
MCH: 25.9 pg — ABNORMAL LOW (ref 26.0–34.0)
MCHC: 31.9 g/dL (ref 30.0–36.0)
MCV: 81.2 fL (ref 80.0–100.0)
Monocytes Absolute: 0.7 10*3/uL (ref 0.1–1.0)
Monocytes Relative: 8 %
Neutro Abs: 4.1 10*3/uL (ref 1.7–7.7)
Neutrophils Relative %: 44 %
Platelet Count: 271 10*3/uL (ref 150–400)
RBC: 5.01 MIL/uL (ref 3.87–5.11)
RDW: 14.7 % (ref 11.5–15.5)
WBC Count: 9.2 10*3/uL (ref 4.0–10.5)
nRBC: 0 % (ref 0.0–0.2)

## 2022-12-01 LAB — CMP (CANCER CENTER ONLY)
ALT: 23 U/L (ref 0–44)
AST: 22 U/L (ref 15–41)
Albumin: 4 g/dL (ref 3.5–5.0)
Alkaline Phosphatase: 94 U/L (ref 38–126)
Anion gap: 6 (ref 5–15)
BUN: 17 mg/dL (ref 8–23)
CO2: 29 mmol/L (ref 22–32)
Calcium: 9.6 mg/dL (ref 8.9–10.3)
Chloride: 102 mmol/L (ref 98–111)
Creatinine: 0.83 mg/dL (ref 0.44–1.00)
GFR, Estimated: >60 mL/min (ref >60)
Glucose, Bld: 128 mg/dL — ABNORMAL HIGH (ref 70–99)
Potassium: 3.8 mmol/L (ref 3.5–5.1)
Sodium: 137 mmol/L (ref 135–145)
Total Bilirubin: 0.5 mg/dL (ref <1.2)
Total Protein: 7.7 g/dL (ref 6.5–8.1)

## 2022-12-01 MED ORDER — DIPHENHYDRAMINE HCL 25 MG PO CAPS
50.0000 mg | ORAL_CAPSULE | Freq: Once | ORAL | Status: AC
  Administered 2022-12-01: 50 mg via ORAL
  Filled 2022-12-01: qty 2

## 2022-12-01 MED ORDER — SODIUM CHLORIDE 0.9 % IV SOLN: Freq: Once | INTRAVENOUS | Status: AC

## 2022-12-01 MED ORDER — TRASTUZUMAB-ANNS CHEMO 150 MG IV SOLR
6.0000 mg/kg | Freq: Once | INTRAVENOUS | Status: AC
  Administered 2022-12-01: 504 mg via INTRAVENOUS
  Filled 2022-12-01: qty 24

## 2022-12-01 MED ORDER — SODIUM CHLORIDE 0.9% FLUSH
10.0000 mL | Freq: Once | INTRAVENOUS | Status: AC
  Administered 2022-12-01: 10 mL

## 2022-12-01 MED ORDER — HEPARIN SOD (PORK) LOCK FLUSH 100 UNIT/ML IV SOLN
500.0000 [IU] | Freq: Once | INTRAVENOUS | Status: AC | PRN
Start: 2022-12-01 — End: 2022-12-01
  Administered 2022-12-01: 500 [IU]

## 2022-12-01 MED ORDER — ACETAMINOPHEN 325 MG PO TABS
650.0000 mg | ORAL_TABLET | Freq: Once | ORAL | Status: AC
  Administered 2022-12-01: 650 mg via ORAL
  Filled 2022-12-01: qty 2

## 2022-12-01 NOTE — Assessment & Plan Note (Signed)
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   She has bilateral breast cancer.  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   Oncotype of 10, no role for adjuvant chemotherapy. She is tolerating anastrozole extremely well.  She will continue anastrozole.  No concerns on review of systems or physical exam. She had bilateral mastectomy hence no need for mammograms. I would recommend 5-10 yrs of anti estrogen therapy.  She denies any issues with anastrozole, okay to continue.

## 2022-12-01 NOTE — Patient Instructions (Signed)
 [facility] - A DEPT OF [facility]  Discharge Instructions: Thank you for choosing [facility] to provide your oncology and hematology care.   If you have a lab appointment with the Cancer Center, please go directly to the Cancer Center and check in at the registration area.   Wear comfortable clothing and clothing appropriate for easy access to any Portacath or PICC line.   We strive to give you quality time with your provider. You may need to reschedule your appointment if you arrive late (15 or more minutes).  Arriving late affects you and other patients whose appointments are after yours.  Also, if you miss three or more appointments without notifying the office, you may be dismissed from the clinic at the provider's discretion.      For prescription refill requests, have your pharmacy contact our office and allow 72 hours for refills to be completed.    Today you received the following chemotherapy and/or immunotherapy agents: Kanjinti      To help prevent nausea and vomiting after your treatment, we encourage you to take your nausea medication as directed.  BELOW ARE SYMPTOMS THAT SHOULD BE REPORTED IMMEDIATELY: *FEVER GREATER THAN 100.4 F (38 C) OR HIGHER *CHILLS OR SWEATING *NAUSEA AND VOMITING THAT IS NOT CONTROLLED WITH YOUR NAUSEA MEDICATION *UNUSUAL SHORTNESS OF BREATH *UNUSUAL BRUISING OR BLEEDING *URINARY PROBLEMS (pain or burning when urinating, or frequent urination) *BOWEL PROBLEMS (unusual diarrhea, constipation, pain near the anus) TENDERNESS IN MOUTH AND THROAT WITH OR WITHOUT PRESENCE OF ULCERS (sore throat, sores in mouth, or a toothache) UNUSUAL RASH, SWELLING OR PAIN  UNUSUAL VAGINAL DISCHARGE OR ITCHING   Items with * indicate a potential emergency and should be followed up as soon as possible or go to the Emergency Department if any problems should occur.  Please show the CHEMOTHERAPY ALERT CARD or IMMUNOTHERAPY  ALERT CARD at check-in to the Emergency Department and triage nurse.  Should you have questions after your visit or need to cancel or reschedule your appointment, please contact [facility] - A DEPT OF [facility]  Dept: [phone]  and follow the prompts.  Office hours are 8:00 a.m. to 4:30 p.m. Monday - Friday. Please note that voicemails left after 4:00 p.m. may not be returned until the following business day.  We are closed weekends and major holidays. You have access to a nurse at all times for urgent questions. Please call the main number to the clinic Dept: [phone] and follow the prompts.   For any non-urgent questions, you may also contact your provider using MyChart. We now offer e-Visits for anyone 29 and older to request care online for non-urgent symptoms. For details visit [email or web address].   Also download the MyChart app! Go to the app store, search "MyChart", open the app, select [place], and log in with your MyChart username and password.

## 2022-12-01 NOTE — Progress Notes (Signed)
[facility] Cancer Follow up:    Wendy Greathouse, MD [address] 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 This is a highly suspicious finding for which ultrasound-guided biopsy is recommended. Adjacent/nearly contiguous complex cystic and solid mass within the RIGHT breast at the 9:30 o'clock axis, 3 cm from the nipple, measuring 1.3 cm. This is a suspicious finding for which ultrasound-guided biopsy is recommended. Highly suspicious calcifications within the inner LEFT breast, with a segmental distribution, measuring 5.7 cm extent. Recommend 2 site stereotactic biopsy to include the more posterior calcifications and the more anterior/retroareolar calcifications.    12/30/2021 Pathology Results   Left breast needle core biopsy from post extent microcalcs showed IDC, overall grade 3.  Prognostics showed ER 100% positive, strong staining intensity, PR 100% positive, strong staining intensity, Ki 67 of 20%. 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  Second area in the right breast needle core biopsy coil clip also showed intermediate grade invasive mammary carcinoma.  Prognostics showed ER 100% positive strong staining PR 15% positive strong staining Ki-67 of 10% and HER2 negative by FISH.  Coil clip prognostic showed ER 95% positive strong staining PR 95% positive strong staining Ki-67 of 30% and HER2 negative by [facility]   02/09/2022 Surgery   Bilateral mastectomies Right breast: ILC, intermediate grade, 1.5cm, LCIS, DCIS, 1LN identifed in breast tissue and negative, T1c, N0   02/09/2022 Cancer Staging   Staging form: Breast, AJCC 8th Edition - Pathologic stage from 02/09/2022: Stage IA (pT1c, pN0, cM0, G2, ER+, PR+, HER2-) - Signed by Loa Socks, NP on 05/05/2022 Stage prefix: Initial diagnosis Histologic grading system: 3 grade system   03/27/2022 -  Chemotherapy   Patient is on Treatment Plan : BREAST MAINTENANCE Trastuzumab IV (6) or SQ (600) D1 q21d x 13 cycles     03/2022 -  Anti-estrogen oral therapy   Anastrozole   Invasive ductal carcinoma of left breast (HCC)  02/09/2022 Surgery   Bilateral Mastectomies: Left breast mastectomy: IDC, g2, 0.3cm, DCIS, ILC intermediate grade, 0.6cm, LCIS, 1SLN + for ITC, 4 SLN negative for malignancy   02/09/2022 Cancer Staging   Staging form: Breast, AJCC 8th Edition - Pathologic stage from 02/09/2022: Stage IA (pT1b, pN0(i+), cM0, G2, ER+, PR+, HER2+) - Signed by Loa Socks, NP on 05/05/2022 Histologic grading system: 3 grade system   03/27/2022 -  Chemotherapy   Patient is on Treatment Plan : BREAST MAINTENANCE Trastuzumab IV (6) or SQ (600) D1 q21d x 13 cycles     03/2022 -  Anti-estrogen oral therapy   Anastrozole      CURRENT THERAPY: Anastrozole/Herceptin  INTERVAL HISTORY:  [name] 77 y.o. female returns for f/u on Anastrozole and Herceptin.   The patient, with a history of breast cancer, is currently undergoing treatment with anastrozole and Herceptin.   The patient presents with a skin lesion, described as 'red' and 'pink looking.' She reports a sensation of burning, likened to touching a hot burner on posterior chest wall, but denies significant pain. The lesion is not yet crusted. She was advised by another provider to monitor the lesion and seek treatment if it worsens.  The patient is also undergoing treatment for a lobular cancer, currently taking anastrozole and receiving Herceptin infusions. She reports no issues with this treatment. She also has a 'extra fat in the right axilla' that is planned to be removed during a future procedure to remove a port.  She otherwise denies any chest pain, shortness of breath, lower extremity swelling.  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  MEDICAL HISTORY: Past Medical History:  Diagnosis Date   Asthma    Deafness in left ear    WEARS HEARING AID LEFT EAR   GERD (gastroesophageal reflux disease)    Hypertension    Pneumonia    in past x 2    SURGICAL HISTORY: Past Surgical History:  Procedure Laterality Date   ABDOMINAL HYSTERECTOMY     BREAST BIOPSY Left 12/30/2021   MM LT BREAST BX W LOC DEV 1ST LESION IMAGE BX SPEC STEREO GUIDE 12/30/2021 [facility]   BREAST  BIOPSY Right 01/02/2022   [place] RT BREAST BX W LOC DEV 1ST LESION IMG BX SPEC US GUIDE 01/02/2022 [facility]   BREAST BIOPSY Right 01/02/2022   [place] RT BREAST BX W LOC DEV EA ADD LESION IMG BX SPEC US GUIDE 01/02/2022 [facility]   BREAST BIOPSY Left 12/30/2021   MM LT BREAST BX W LOC DEV EA AD LESION IMG BX SPEC STEREO GUIDE 12/30/2021 [facility]   CHOLECYSTECTOMY     EYE SURGERY  2016   bilateral cataract surgery   NEPHRECTOMY Left 02/04/2015   Procedure: OPEN RADICAL NEPHRECTOMY;  Surgeon: Heloise Purpura, MD;  Location: [facility];  Service: Urology;  Laterality: Left;   PORTACATH PLACEMENT Left 02/09/2022   Procedure: INSERTION PORT-A-CATH;  Surgeon: Abigail Miyamoto, MD;  Location[facility] OR;  Service: General;  Laterality: Left;   SENTINEL NODE BIOPSY Bilateral 02/09/2022   Procedure: BILATERAL SENTINEL NODE BIOPSY;  Surgeon: Abigail Miyamoto, MD;  Location: [facility] OR;  Service: General;  Laterality: Bilateral;   TOTAL MASTECTOMY Bilateral 02/09/2022   Procedure: BILATERAL TOTAL MASTECTOMY;  Surgeon: Abigail Miyamoto, MD;  Location: [facility] OR;  Service: General;  Laterality: Bilateral;   TUBAL LIGATION      SOCIAL HISTORY: Social History   Socioeconomic History   Marital status: Married  Spouse name: Not on file   Number of children: 2   Years of education: Not on file   Highest education level: Not on file  Occupational History   Not on file  Tobacco Use   Smoking status: Never   Smokeless tobacco: Not on file  Substance and Sexual Activity   Alcohol use: No   Drug use: No   Sexual activity: Not on file  Other Topics Concern   Not on file  Social History Narrative   Not on file   Social Determinants of Health   Financial Resource Strain: Low Risk  (01/30/2022)   Overall Financial Resource Strain (CARDIA)    Difficulty of Paying Living Expenses: Not very hard  Food Insecurity: No Food Insecurity (01/30/2022)   Hunger Vital Sign    Worried About Running Out of  Food in the Last Year: Never true    Ran Out of Food in the Last Year: Never true  Transportation Needs: Not on file  Physical Activity: Not on file  Stress: Not on file  Social Connections: Not on file  Intimate Partner Violence: Not on file    FAMILY HISTORY: Family History  Problem Relation Age of Onset   Breast cancer Mother 43 - 90   Colon cancer Mother 79   Breast cancer Sister 76 - 70   Colon cancer Sister 21 - 12   Sleep apnea Brother    Cancer Brother        unknown type, possibly stomach cancer   Thyroid cancer Daughter 36    Review of Systems  Constitutional:  Negative for appetite change, chills, fatigue, fever and unexpected weight change.  HENT:   Negative for hearing loss, lump/mass and trouble swallowing.   Eyes:  Negative for eye problems and icterus.  Respiratory:  Negative for chest tightness, cough and shortness of breath.   Cardiovascular:  Negative for chest pain, leg swelling and palpitations.  Gastrointestinal:  Negative for abdominal distention, abdominal pain, constipation, diarrhea, nausea and vomiting.  Endocrine: Negative for hot flashes.  Genitourinary:  Negative for difficulty urinating.   Musculoskeletal:  Negative for arthralgias.  Skin:  Negative for itching and rash.  Neurological:  Negative for dizziness, extremity weakness, headaches and numbness.  Hematological:  Negative for adenopathy. Does not bruise/bleed easily.  Psychiatric/Behavioral:  Negative for depression. The patient is not nervous/anxious.       PHYSICAL EXAMINATION     Vitals:   12/01/22 1149  BP: (!) 154/61  Pulse: 63  Resp: 16  Temp: 97.8 F (36.6 C)  SpO2: 97%    Physical Exam Constitutional:      General: She is not in acute distress.    Appearance: Normal appearance. She is not toxic-appearing.  HENT:     Head: Normocephalic and atraumatic.     Mouth/Throat:     Mouth: Mucous membranes are moist.     Pharynx: Oropharynx is clear. No oropharyngeal  exudate or posterior oropharyngeal erythema.  Eyes:     General: No scleral icterus. Cardiovascular:     Rate and Rhythm: Normal rate and regular rhythm.     Pulses: Normal pulses.     Heart sounds: Normal heart sounds.  Pulmonary:     Effort: Pulmonary effort is normal.     Breath sounds: Normal breath sounds.  Abdominal:     General: Abdomen is flat. Bowel sounds are normal. There is no distension.     Palpations: Abdomen is soft.     Tenderness: There  is no abdominal tenderness.  Musculoskeletal:        General: No swelling.     Cervical back: Neck supple.  Lymphadenopathy:     Cervical: No cervical adenopathy.  Skin:    General: Skin is warm and dry.     Findings: No rash.  Neurological:     General: No focal deficit present.     Mental Status: She is alert.  Psychiatric:        Mood and Affect: Mood normal.        Behavior: Behavior normal.     LABORATORY DATA:  CBC    Component Value Date/Time   WBC 9.2 12/01/2022 1117   WBC 10.9 (H) 11/23/2022 2235   RBC 5.01 12/01/2022 1117   HGB 13.0 12/01/2022 1117   HCT 40.7 12/01/2022 1117   PLT 271 12/01/2022 1117   MCV 81.2 12/01/2022 1117   MCH 25.9 (L) 12/01/2022 1117   MCHC 31.9 12/01/2022 1117   RDW 14.7 12/01/2022 1117   LYMPHSABS 4.1 (H) 12/01/2022 1117   MONOABS 0.7 12/01/2022 1117   EOSABS 0.2 12/01/2022 1117   BASOSABS 0.1 12/01/2022 1117    CMP     Component Value Date/Time   NA 137 12/01/2022 1117   K 3.8 12/01/2022 1117   CL 102 12/01/2022 1117   CO2 29 12/01/2022 1117   GLUCOSE 128 (H) 12/01/2022 1117   BUN 17 12/01/2022 1117   CREATININE 0.83 12/01/2022 1117   CALCIUM 9.6 12/01/2022 1117   PROT 7.7 12/01/2022 1117   ALBUMIN 4.0 12/01/2022 1117   AST 22 12/01/2022 1117   ALT 23 12/01/2022 1117   ALKPHOS 94 12/01/2022 1117   BILITOT 0.5 12/01/2022 1117   GFRNONAA >60 12/01/2022 1117   GFRAA >60 02/07/2015 0439      ASSESSMENT and THERAPY PLAN:   Invasive ductal carcinoma of left  breast (HCC) Left breast biopsy showed invasive ductal carcinoma, high-grade in the anterior calcifications, ER/PR and HER2 amplified.   This lesion measured 3 mm. ITC noted in the left axillary LN. Unfortunately progs cannot be done on ITC. She is on Herceptin alone.  Most recent echo with no major findings, will continue Herceptin. RTC every 3 weeks for infusion and every 6 weeks for FU  Shingles Mild case with burning sensation but no severe pain. No antiviral treatment initiated at this time. -Advise to keep area covered and maintain hygiene to prevent spread. -If symptoms worsen, consider initiation of antiviral therapy.  General Health Maintenance -Reschedule appointments on December 31st to December 30th. -Follow-up in six weeks.  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   She has bilateral breast cancer.  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   Oncotype of 10, no role for adjuvant chemotherapy. She is tolerating anastrozole extremely well.  She will continue anastrozole.  No concerns on review of systems or physical exam. She had bilateral mastectomy hence no need for mammograms. I would recommend 5-10 yrs of anti estrogen therapy.  She denies any issues with anastrozole, okay to continue.     All questions were answered. The patient knows to call the clinic with any problems, questions or concerns. We can certainly see the patient much sooner if necessary.

## 2022-12-01 NOTE — Assessment & Plan Note (Addendum)
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   This lesion measured 3 mm. ITC noted in the left axillary LN. Unfortunately progs cannot be done on ITC. She is on Herceptin alone.  Most recent echo with no major findings, will continue Herceptin. RTC every 3 weeks for infusion and every 6 weeks for FU  Shingles Mild case with burning sensation but no severe pain. No antiviral treatment initiated at this time. -Advise to keep area covered and maintain hygiene to prevent spread. -If symptoms worsen, consider initiation of antiviral therapy.  General Health Maintenance -Reschedule appointments on December 31st to December 30th. -Follow-up in six weeks.

## 2022-12-14 ENCOUNTER — Telehealth: Payer: Self-pay

## 2022-12-14 NOTE — Telephone Encounter (Signed)
Transition Care Management Follow-up Telephone Call Date of discharge and from where: 11/24/2022 [facility] How have you been since you were released from the hospital? Patient stated she is feeling better. Any questions or concerns? No  Items Reviewed: Did the pt receive and understand the discharge instructions provided? Yes  Medications obtained and verified?  No medication prescribed. Patient is able to afford medications. Other? No  Any new allergies since your discharge? No  Dietary orders reviewed? Yes Do you have support at home? Yes   Follow up appointments reviewed:  PCP Hospital f/u appt confirmed? Yes  Scheduled to see [name], MD on 11/25/2022 @ [facility] f/u appt confirmed? No  Scheduled to see  on  @ . Are transportation arrangements needed? No  If their condition worsens, is the pt aware to call PCP or go to the Emergency Dept.? Yes Was the patient provided with contact information for the PCP's office or ED? Yes Was to pt encouraged to call back with questions or concerns? Yes   Aubery Date [facility]  [facility], [facility] Guide Direct Dial: [phone]  Website: [email or web address]

## 2022-12-16 DIAGNOSIS — G4733 Obstructive sleep apnea (adult) (pediatric): Secondary | ICD-10-CM | POA: Diagnosis not present

## 2022-12-16 DIAGNOSIS — I10 Essential (primary) hypertension: Secondary | ICD-10-CM | POA: Diagnosis not present

## 2022-12-20 IMAGING — MG MM DIGITAL SCREENING BILAT W/ TOMO AND CAD
8 series · 8 of 24 positions shown · non-contrast
Comparison: Previous exam(s).

CLINICAL DATA: Screening.

EXAM:
DIGITAL SCREENING BILATERAL MAMMOGRAM WITH TOMOSYNTHESIS AND CAD
TECHNIQUE: Bilateral screening digital craniocaudal and mediolateral oblique
mammograms were obtained. Bilateral screening digital breast
tomosynthesis was performed. The images were evaluated with
computer-aided detection.

[R CC synth-2D]
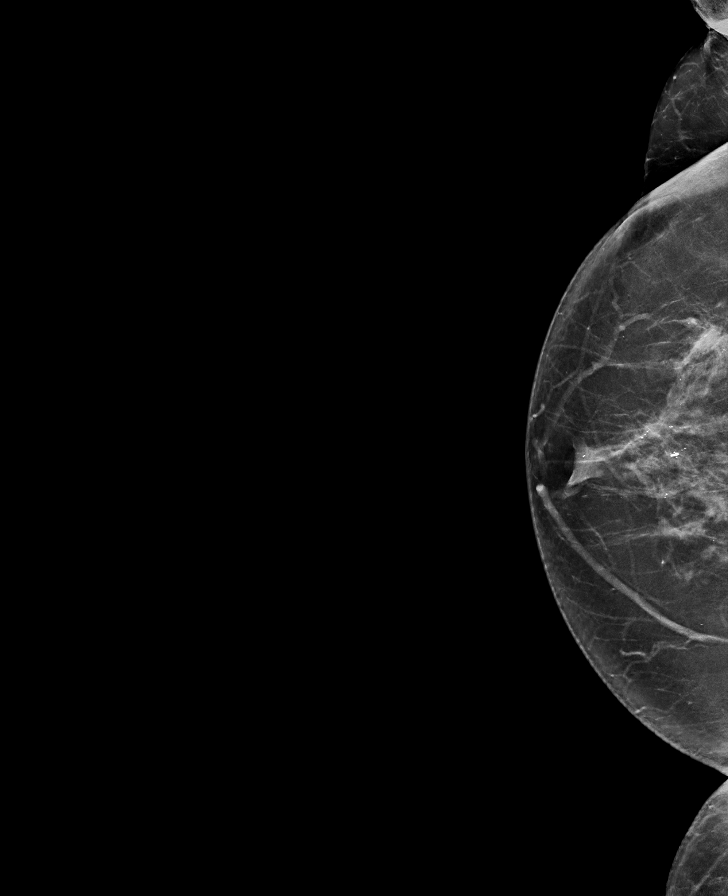

[R MLO synth-2D]
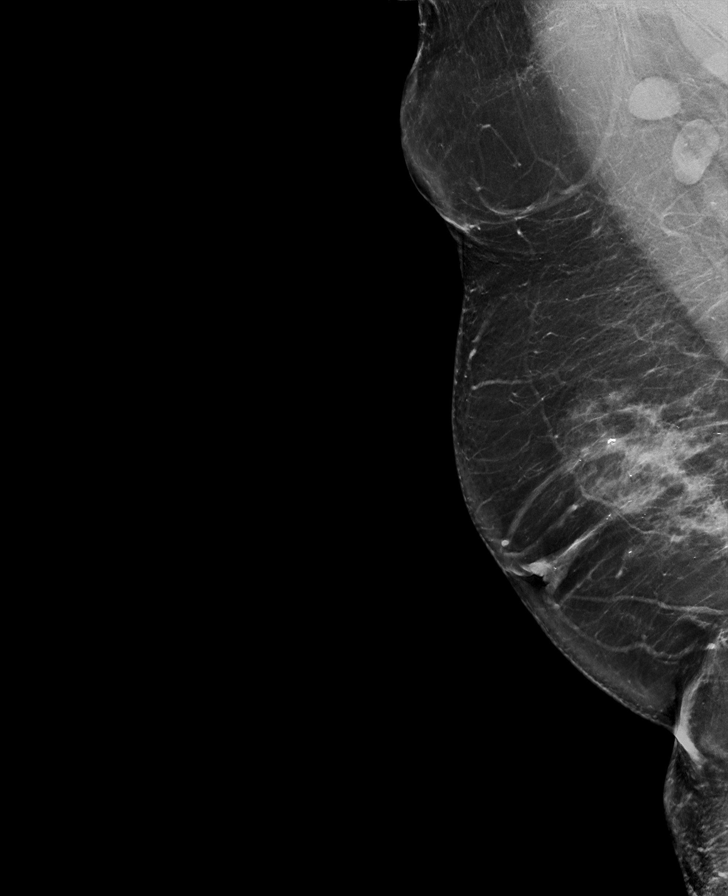

[L CC synth-2D]
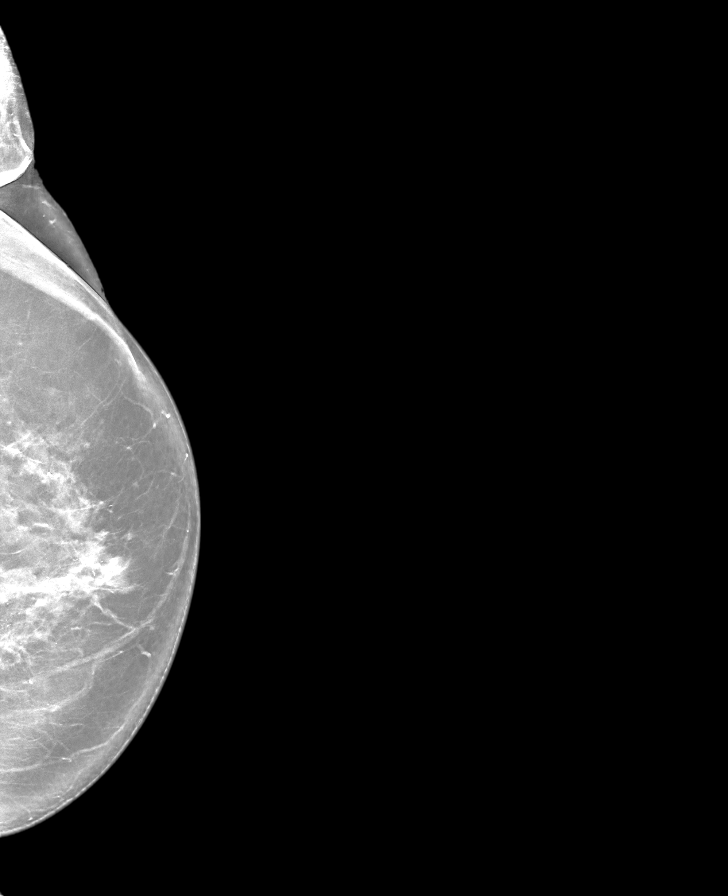

[L MLO synth-2D]
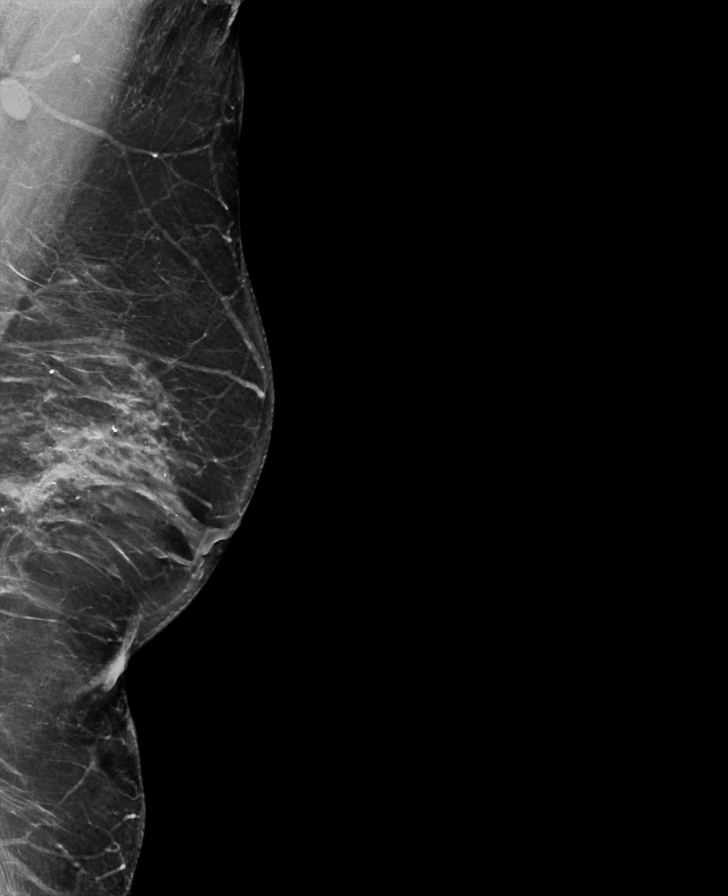

[L MLO tomo · tomo slice 41/81.0]
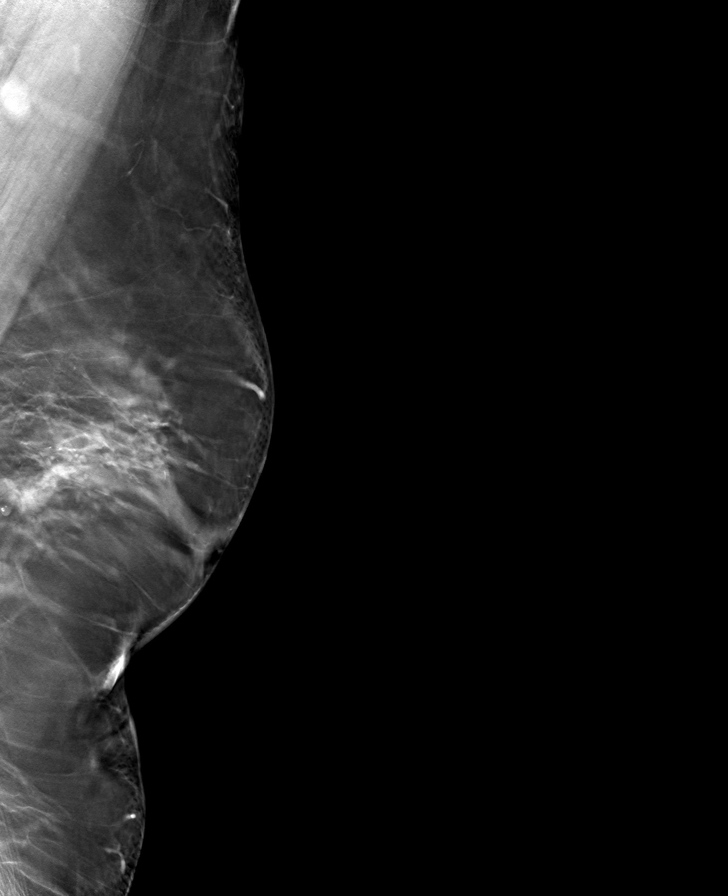

[L CC tomo · tomo slice 39/78.0]
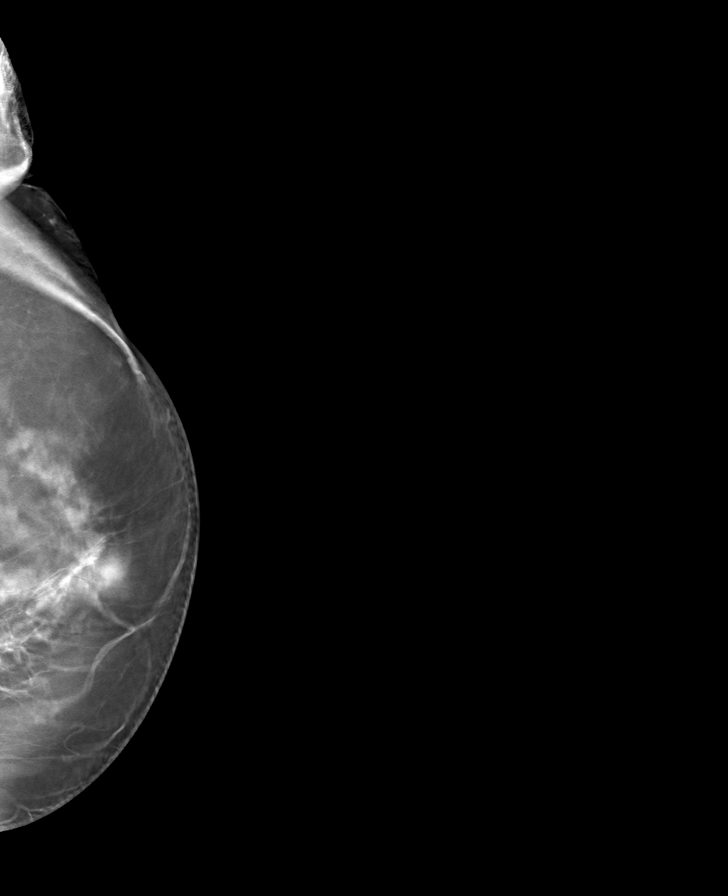

[R MLO tomo · tomo slice 42/83.0]
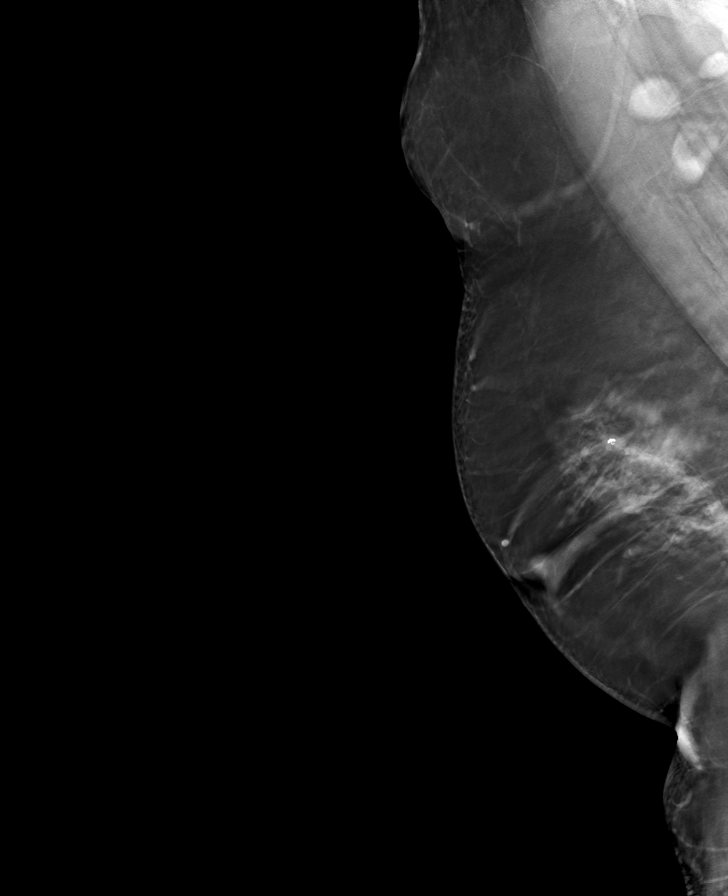

[R CC tomo · tomo slice 37/72.0]
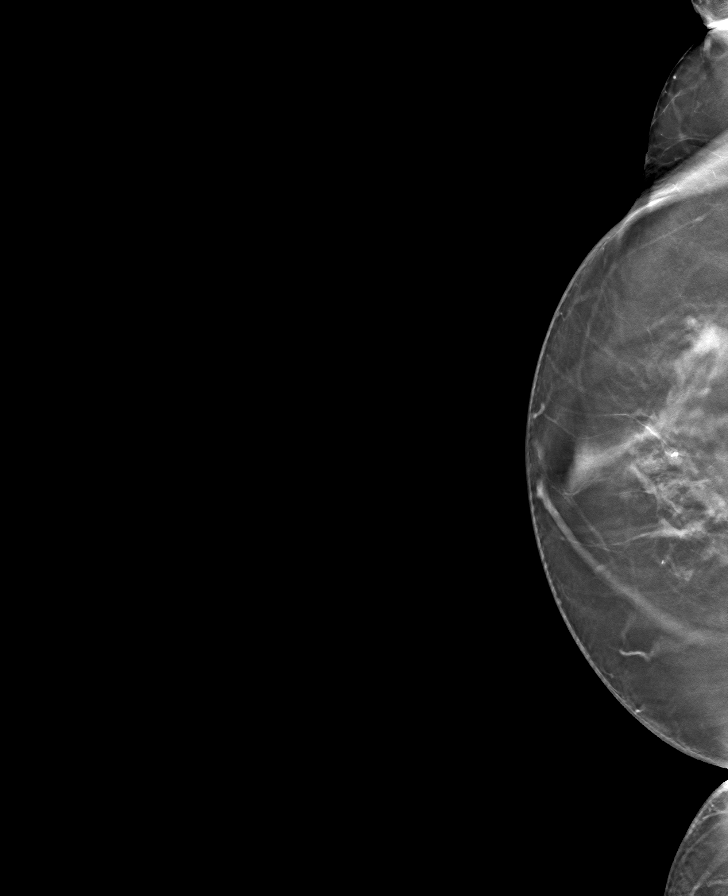

[8 of 24 positions shown; findings below may reference images not displayed]

ACR Breast Density Category c: The breast tissue is heterogeneously
dense, which may obscure small masses.
FINDINGS: There are no findings suspicious for malignancy.
IMPRESSION: No mammographic evidence of malignancy. A result letter of this
screening mammogram will be mailed directly to the patient.

RECOMMENDATION:
Screening mammogram in one year. (Code:Q3-W-BC3)

BI-RADS CATEGORY  1: Negative.

## 2022-12-21 ENCOUNTER — Other Ambulatory Visit: Payer: Self-pay | Admitting: *Deleted

## 2022-12-21 ENCOUNTER — Encounter: Payer: Self-pay | Admitting: *Deleted

## 2022-12-21 DIAGNOSIS — C50911 Malignant neoplasm of unspecified site of right female breast: Secondary | ICD-10-CM

## 2022-12-22 ENCOUNTER — Ambulatory Visit: Payer: Medicare HMO | Admitting: Adult Health

## 2022-12-22 ENCOUNTER — Inpatient Hospital Stay: Payer: Medicare HMO | Attending: Hematology and Oncology

## 2022-12-22 ENCOUNTER — Inpatient Hospital Stay: Payer: Medicare HMO

## 2022-12-22 VITALS — BP 166/68 | HR 60 | Temp 97.6°F | Resp 16 | Wt 189.5 lb

## 2022-12-22 DIAGNOSIS — Z5112 Encounter for antineoplastic immunotherapy: Secondary | ICD-10-CM | POA: Insufficient documentation

## 2022-12-22 DIAGNOSIS — C50411 Malignant neoplasm of upper-outer quadrant of right female breast: Secondary | ICD-10-CM | POA: Insufficient documentation

## 2022-12-22 DIAGNOSIS — M8580 Other specified disorders of bone density and structure, unspecified site: Secondary | ICD-10-CM | POA: Diagnosis not present

## 2022-12-22 DIAGNOSIS — C50912 Malignant neoplasm of unspecified site of left female breast: Secondary | ICD-10-CM

## 2022-12-22 DIAGNOSIS — Z95828 Presence of other vascular implants and grafts: Secondary | ICD-10-CM

## 2022-12-22 DIAGNOSIS — Z79811 Long term (current) use of aromatase inhibitors: Secondary | ICD-10-CM | POA: Diagnosis not present

## 2022-12-22 DIAGNOSIS — C50911 Malignant neoplasm of unspecified site of right female breast: Secondary | ICD-10-CM

## 2022-12-22 DIAGNOSIS — Z170 Estrogen receptor positive status [ER+]: Secondary | ICD-10-CM | POA: Insufficient documentation

## 2022-12-22 LAB — CBC WITH DIFFERENTIAL (CANCER CENTER ONLY)
Abs Immature Granulocytes: 0.03 10*3/uL (ref 0.00–0.07)
Basophils Absolute: 0 10*3/uL (ref 0.0–0.1)
Basophils Relative: 1 %
Eosinophils Absolute: 0.3 10*3/uL (ref 0.0–0.5)
Eosinophils Relative: 4 %
HCT: 40.4 % (ref 36.0–46.0)
Hemoglobin: 12.7 g/dL (ref 12.0–15.0)
Immature Granulocytes: 0 %
Lymphocytes Relative: 42 %
Lymphs Abs: 3.4 10*3/uL (ref 0.7–4.0)
MCH: 26 pg (ref 26.0–34.0)
MCHC: 31.4 g/dL (ref 30.0–36.0)
MCV: 82.6 fL (ref 80.0–100.0)
Monocytes Absolute: 0.9 10*3/uL (ref 0.1–1.0)
Monocytes Relative: 11 %
Neutro Abs: 3.5 10*3/uL (ref 1.7–7.7)
Neutrophils Relative %: 42 %
Platelet Count: 281 10*3/uL (ref 150–400)
RBC: 4.89 MIL/uL (ref 3.87–5.11)
RDW: 14.6 % (ref 11.5–15.5)
WBC Count: 8.2 10*3/uL (ref 4.0–10.5)
nRBC: 0 % (ref 0.0–0.2)

## 2022-12-22 LAB — CMP (CANCER CENTER ONLY)
ALT: 22 U/L (ref 0–44)
AST: 21 U/L (ref 15–41)
Albumin: 3.9 g/dL (ref 3.5–5.0)
Alkaline Phosphatase: 90 U/L (ref 38–126)
Anion gap: 5 (ref 5–15)
BUN: 14 mg/dL (ref 8–23)
CO2: 29 mmol/L (ref 22–32)
Calcium: 9.5 mg/dL (ref 8.9–10.3)
Chloride: 105 mmol/L (ref 98–111)
Creatinine: 0.83 mg/dL (ref 0.44–1.00)
GFR, Estimated: >60 mL/min (ref >60)
Glucose, Bld: 92 mg/dL (ref 70–99)
Potassium: 3.9 mmol/L (ref 3.5–5.1)
Sodium: 139 mmol/L (ref 135–145)
Total Bilirubin: 0.5 mg/dL (ref <1.2)
Total Protein: 7.6 g/dL (ref 6.5–8.1)

## 2022-12-22 MED ORDER — SODIUM CHLORIDE 0.9% FLUSH
10.0000 mL | Freq: Once | INTRAVENOUS | Status: AC
  Administered 2022-12-22: 10 mL

## 2022-12-22 MED ORDER — TRASTUZUMAB-ANNS CHEMO 150 MG IV SOLR
6.0000 mg/kg | Freq: Once | INTRAVENOUS | Status: AC
  Administered 2022-12-22: 504 mg via INTRAVENOUS
  Filled 2022-12-22: qty 24

## 2022-12-22 MED ORDER — SODIUM CHLORIDE 0.9 % IV SOLN: Freq: Once | INTRAVENOUS | Status: AC

## 2022-12-22 MED ORDER — SODIUM CHLORIDE 0.9% FLUSH
10.0000 mL | INTRAVENOUS | Status: DC | PRN
  Administered 2022-12-22: 10 mL

## 2022-12-22 MED ORDER — ACETAMINOPHEN 325 MG PO TABS
650.0000 mg | ORAL_TABLET | Freq: Once | ORAL | Status: AC
Start: 2022-12-22 — End: 2022-12-22
  Administered 2022-12-22: 650 mg via ORAL
  Filled 2022-12-22: qty 2

## 2022-12-22 MED ORDER — DIPHENHYDRAMINE HCL 25 MG PO CAPS
50.0000 mg | ORAL_CAPSULE | Freq: Once | ORAL | Status: AC
  Administered 2022-12-22: 50 mg via ORAL
  Filled 2022-12-22: qty 2

## 2022-12-22 MED ORDER — HEPARIN SOD (PORK) LOCK FLUSH 100 UNIT/ML IV SOLN
500.0000 [IU] | Freq: Once | INTRAVENOUS | Status: AC | PRN
  Administered 2022-12-22: 500 [IU]

## 2022-12-22 NOTE — Patient Instructions (Signed)
[facility] - A DEPT OF [facility]  Discharge Instructions: Thank you for choosing [facility] to provide your oncology and hematology care.   If you have a lab appointment with the Cancer Center, please go directly to the Cancer Center and check in at the registration area.   Wear comfortable clothing and clothing appropriate for easy access to any Portacath or PICC line.   We strive to give you quality time with your provider. You may need to reschedule your appointment if you arrive late (15 or more minutes).  Arriving late affects you and other patients whose appointments are after yours.  Also, if you miss three or more appointments without notifying the office, you may be dismissed from the clinic at the provider's discretion.      For prescription refill requests, have your pharmacy contact our office and allow 72 hours for refills to be completed.    Today you received the following chemotherapy and/or immunotherapy agents: trastuzumab-anns      To help prevent nausea and vomiting after your treatment, we encourage you to take your nausea medication as directed.  BELOW ARE SYMPTOMS THAT SHOULD BE REPORTED IMMEDIATELY: *FEVER GREATER THAN 100.4 F (38 C) OR HIGHER *CHILLS OR SWEATING *NAUSEA AND VOMITING THAT IS NOT CONTROLLED WITH YOUR NAUSEA MEDICATION *UNUSUAL SHORTNESS OF BREATH *UNUSUAL BRUISING OR BLEEDING *URINARY PROBLEMS (pain or burning when urinating, or frequent urination) *BOWEL PROBLEMS (unusual diarrhea, constipation, pain near the anus) TENDERNESS IN MOUTH AND THROAT WITH OR WITHOUT PRESENCE OF ULCERS (sore throat, sores in mouth, or a toothache) UNUSUAL RASH, SWELLING OR PAIN  UNUSUAL VAGINAL DISCHARGE OR ITCHING   Items with * indicate a potential emergency and should be followed up as soon as possible or go to the Emergency Department if any problems should occur.  Please show the CHEMOTHERAPY ALERT CARD or  IMMUNOTHERAPY ALERT CARD at check-in to the Emergency Department and triage nurse.  Should you have questions after your visit or need to cancel or reschedule your appointment, please contact [facility] - A DEPT [facility]  Dept: [phone]  and follow the prompts.  Office hours are 8:00 a.m. to 4:30 p.m. Monday - Friday. Please note that voicemails left after 4:00 p.m. may not be returned until the following business day.  We are closed weekends and major holidays. You have access to a nurse at all times for urgent questions. Please call the main number to the clinic Dept: [phone] and follow the prompts.   For any non-urgent questions, you may also contact your provider using MyChart. We now offer e-Visits for anyone 73 and older to request care online for non-urgent symptoms. For details visit [email or web address].   Also download the MyChart app! Go to the app store, search "MyChart", open the app, select [facility], and log in with your MyChart username and password.

## 2023-01-11 ENCOUNTER — Encounter: Payer: Self-pay | Admitting: *Deleted

## 2023-01-11 ENCOUNTER — Inpatient Hospital Stay: Payer: Medicare HMO

## 2023-01-11 ENCOUNTER — Other Ambulatory Visit: Payer: Self-pay

## 2023-01-11 ENCOUNTER — Inpatient Hospital Stay (HOSPITAL_BASED_OUTPATIENT_CLINIC_OR_DEPARTMENT_OTHER): Payer: Medicare HMO | Admitting: Hematology and Oncology

## 2023-01-11 VITALS — BP 152/55 | HR 63 | Temp 98.0°F | Resp 17 | Wt 188.2 lb

## 2023-01-11 DIAGNOSIS — Z6835 Body mass index (BMI) 35.0-35.9, adult: Secondary | ICD-10-CM | POA: Diagnosis not present

## 2023-01-11 DIAGNOSIS — C50911 Malignant neoplasm of unspecified site of right female breast: Secondary | ICD-10-CM

## 2023-01-11 DIAGNOSIS — I10 Essential (primary) hypertension: Secondary | ICD-10-CM | POA: Diagnosis not present

## 2023-01-11 DIAGNOSIS — Z7951 Long term (current) use of inhaled steroids: Secondary | ICD-10-CM | POA: Diagnosis not present

## 2023-01-11 DIAGNOSIS — Z809 Family history of malignant neoplasm, unspecified: Secondary | ICD-10-CM | POA: Diagnosis not present

## 2023-01-11 DIAGNOSIS — Z5112 Encounter for antineoplastic immunotherapy: Secondary | ICD-10-CM | POA: Diagnosis not present

## 2023-01-11 DIAGNOSIS — E785 Hyperlipidemia, unspecified: Secondary | ICD-10-CM | POA: Diagnosis not present

## 2023-01-11 DIAGNOSIS — J45909 Unspecified asthma, uncomplicated: Secondary | ICD-10-CM | POA: Diagnosis not present

## 2023-01-11 DIAGNOSIS — R32 Unspecified urinary incontinence: Secondary | ICD-10-CM | POA: Diagnosis not present

## 2023-01-11 DIAGNOSIS — C50912 Malignant neoplasm of unspecified site of left female breast: Secondary | ICD-10-CM

## 2023-01-11 DIAGNOSIS — M1990 Unspecified osteoarthritis, unspecified site: Secondary | ICD-10-CM | POA: Diagnosis not present

## 2023-01-11 DIAGNOSIS — Z8249 Family history of ischemic heart disease and other diseases of the circulatory system: Secondary | ICD-10-CM | POA: Diagnosis not present

## 2023-01-11 DIAGNOSIS — C50919 Malignant neoplasm of unspecified site of unspecified female breast: Secondary | ICD-10-CM | POA: Diagnosis not present

## 2023-01-11 DIAGNOSIS — G4733 Obstructive sleep apnea (adult) (pediatric): Secondary | ICD-10-CM | POA: Diagnosis not present

## 2023-01-11 MED ORDER — ACETAMINOPHEN 325 MG PO TABS
650.0000 mg | ORAL_TABLET | Freq: Once | ORAL | Status: AC
  Administered 2023-01-11: 650 mg via ORAL
  Filled 2023-01-11: qty 2

## 2023-01-11 MED ORDER — HEPARIN SOD (PORK) LOCK FLUSH 100 UNIT/ML IV SOLN
500.0000 [IU] | Freq: Once | INTRAVENOUS | Status: AC | PRN
  Administered 2023-01-11: 500 [IU]

## 2023-01-11 MED ORDER — SODIUM CHLORIDE 0.9 % IV SOLN: Freq: Once | INTRAVENOUS | Status: AC

## 2023-01-11 MED ORDER — SODIUM CHLORIDE 0.9% FLUSH
10.0000 mL | INTRAVENOUS | Status: DC | PRN
  Administered 2023-01-11: 10 mL

## 2023-01-11 MED ORDER — SODIUM CHLORIDE 0.9 % IV SOLN
6.0000 mg/kg | Freq: Once | INTRAVENOUS | Status: AC
  Administered 2023-01-11: 504 mg via INTRAVENOUS
  Filled 2023-01-11: qty 24

## 2023-01-11 MED ORDER — DIPHENHYDRAMINE HCL 25 MG PO CAPS
50.0000 mg | ORAL_CAPSULE | Freq: Once | ORAL | Status: AC
Start: 2023-01-11 — End: 2023-01-11
  Administered 2023-01-11: 50 mg via ORAL
  Filled 2023-01-11: qty 2

## 2023-01-11 NOTE — Patient Instructions (Signed)
 [facility] - A DEPT OF [facility]  Discharge Instructions: Thank you for choosing [facility] to provide your oncology and hematology care.   If you have a lab appointment with the Cancer Center, please go directly to the Cancer Center and check in at the registration area.   Wear comfortable clothing and clothing appropriate for easy access to any Portacath or PICC line.   We strive to give you quality time with your provider. You may need to reschedule your appointment if you arrive late (15 or more minutes).  Arriving late affects you and other patients whose appointments are after yours.  Also, if you miss three or more appointments without notifying the office, you may be dismissed from the clinic at the provider's discretion.      For prescription refill requests, have your pharmacy contact our office and allow 72 hours for refills to be completed.    Today you received the following chemotherapy and/or immunotherapy agents: trastuzumab-anns      To help prevent nausea and vomiting after your treatment, we encourage you to take your nausea medication as directed.  BELOW ARE SYMPTOMS THAT SHOULD BE REPORTED IMMEDIATELY: *FEVER GREATER THAN 100.4 F (38 C) OR HIGHER *CHILLS OR SWEATING *NAUSEA AND VOMITING THAT IS NOT CONTROLLED WITH YOUR NAUSEA MEDICATION *UNUSUAL SHORTNESS OF BREATH *UNUSUAL BRUISING OR BLEEDING *URINARY PROBLEMS (pain or burning when urinating, or frequent urination) *BOWEL PROBLEMS (unusual diarrhea, constipation, pain near the anus) TENDERNESS IN MOUTH AND THROAT WITH OR WITHOUT PRESENCE OF ULCERS (sore throat, sores in mouth, or a toothache) UNUSUAL RASH, SWELLING OR PAIN  UNUSUAL VAGINAL DISCHARGE OR ITCHING   Items with * indicate a potential emergency and should be followed up as soon as possible or go to the Emergency Department if any problems should occur.  Please show the CHEMOTHERAPY ALERT CARD or  IMMUNOTHERAPY ALERT CARD at check-in to the Emergency Department and triage nurse.  Should you have questions after your visit or need to cancel or reschedule your appointment, please contact [facility] - [facility]  Dept: [phone]  and follow the prompts.  Office hours are 8:00 a.m. to 4:30 p.m. Monday - Friday. Please note that voicemails left after 4:00 p.m. may not be returned until the following business day.  We are closed weekends and major holidays. You have access to a nurse at all times for urgent questions. Please call the main number to the clinic Dept: [phone] and follow the prompts.   For any non-urgent questions, you may also contact your provider using MyChart. We now offer e-Visits for anyone 73 and older to request care online for non-urgent symptoms. For details visit [email or web address].   Also download the MyChart app! Go to the app store, search "MyChart", open the app, select [facility], and log in with your MyChart username and password.

## 2023-01-11 NOTE — Assessment & Plan Note (Deleted)
 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   This lesion measured 3 mm. ITC noted in the left axillary LN. Unfortunately progs cannot be done on ITC. She is on Herceptin alone.  Most recent echo with no major findings, will continue Herceptin. RTC every 3 weeks for infusion and every 6 weeks for FU  Shingles Mild case with burning sensation but no severe pain. No antiviral treatment initiated at this time. -Advise to keep area covered and maintain hygiene to prevent spread. -If symptoms worsen, consider initiation of antiviral therapy.  General Health Maintenance -Reschedule appointments on December 31st to December 30th. -Follow-up in six weeks.

## 2023-01-11 NOTE — Progress Notes (Signed)
[facility] Cancer Follow up:    Wendy Greathouse, MD 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 This is a highly suspicious finding for which ultrasound-guided biopsy is recommended. Adjacent/nearly contiguous complex cystic and solid mass within the RIGHT breast at the 9:30 o'clock axis, 3 cm from the nipple, measuring 1.3 cm. This is a suspicious finding for which ultrasound-guided biopsy is recommended. Highly suspicious calcifications within the inner LEFT breast, with a segmental distribution, measuring 5.7 cm extent. Recommend 2 site stereotactic biopsy to include the more posterior calcifications and the more anterior/retroareolar calcifications.    12/30/2021 Pathology Results   Left breast needle core biopsy from post extent microcalcs showed IDC, overall grade 3.  Prognostics showed ER 100% positive, strong staining intensity, PR 100% positive, strong staining intensity, Ki 67 of 20%. 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  Second area in the right breast needle core biopsy coil clip also showed intermediate grade invasive mammary carcinoma.  Prognostics showed ER 100% positive strong staining PR 15% positive strong staining Ki-67 of 10% and HER2 negative by FISH.  Coil clip prognostic showed ER 95% positive strong staining PR 95% positive strong staining Ki-67 of 30% and HER2 negative by [facility]   02/09/2022 Surgery   Bilateral mastectomies Right breast: ILC, intermediate grade, 1.5cm, LCIS, DCIS, 1LN identifed in breast tissue and negative, T1c, N0   02/09/2022 Cancer Staging   Staging form: Breast, AJCC 8th Edition - Pathologic stage from 02/09/2022: Stage IA (pT1c, pN0, cM0, G2, ER+, PR+, HER2-) - Signed by Loa Socks, NP on 05/05/2022 Stage prefix: Initial diagnosis Histologic grading system: 3 grade system   03/27/2022 -  Chemotherapy   Patient is on Treatment Plan : BREAST MAINTENANCE Trastuzumab IV (6) or SQ (600) D1 q21d x 13 cycles     03/2022 -  Anti-estrogen oral therapy   Anastrozole   Invasive ductal carcinoma of left breast (HCC)  02/09/2022 Surgery   Bilateral Mastectomies: Left breast mastectomy: IDC, g2, 0.3cm, DCIS, ILC intermediate grade, 0.6cm, LCIS, 1SLN + for ITC, 4 SLN negative for malignancy   02/09/2022 Cancer Staging   Staging form: Breast, AJCC 8th Edition - Pathologic stage from 02/09/2022: Stage IA (pT1b, pN0(i+), cM0, G2, ER+, PR+, HER2+) - Signed by Loa Socks, NP on 05/05/2022 Histologic grading system: 3 grade system   03/27/2022 -  Chemotherapy   Patient is on Treatment Plan : BREAST MAINTENANCE Trastuzumab IV (6) or SQ (600) D1 q21d x 13 cycles     03/2022 -  Anti-estrogen oral therapy   Anastrozole      CURRENT THERAPY: Anastrozole/Herceptin  INTERVAL HISTORY:  [name] 77 y.o. female returns for f/u on Anastrozole and Herceptin.   The patient, with a history of breast cancer, presents for a routine follow-up. She reports a positive experience with the chemotherapy nurses and has been adhering to her anastrozole and Herceptin regimen. She denies any new health issues. Previously, she had a shingles outbreak, which has since resolved and is no longer causing pain. She also reports taking vitamin D and maintaining physical activity, including walking with a walking stick for balance. Her last bone density scan showed osteopenia.  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  MEDICAL HISTORY: Past Medical History:  Diagnosis Date   Asthma    Deafness in left ear    WEARS HEARING AID LEFT EAR   GERD (gastroesophageal reflux disease)    Hypertension    Pneumonia    in past x 2    SURGICAL HISTORY: Past Surgical History:  Procedure Laterality Date   ABDOMINAL HYSTERECTOMY     BREAST BIOPSY Left 12/30/2021   MM LT BREAST BX W LOC DEV 1ST LESION IMAGE BX SPEC STEREO GUIDE 12/30/2021 [facility]   BREAST BIOPSY Right 01/02/2022   [place] RT BREAST BX W LOC DEV 1ST LESION IMG BX SPEC US GUIDE 01/02/2022 [facility]   BREAST BIOPSY Right 01/02/2022   [place] RT BREAST BX W LOC DEV EA ADD LESION IMG BX SPEC US GUIDE 01/02/2022 [facility]   BREAST BIOPSY Left 12/30/2021   MM LT BREAST BX W LOC DEV EA AD LESION IMG BX SPEC STEREO GUIDE 12/30/2021 [facility]    CHOLECYSTECTOMY     EYE SURGERY  2016   bilateral cataract surgery   NEPHRECTOMY Left 02/04/2015   Procedure: OPEN RADICAL NEPHRECTOMY;  Surgeon: Heloise Purpura, MD;  Location: [facility];  Service: Urology;  Laterality: Left;   PORTACATH PLACEMENT Left 02/09/2022   Procedure: INSERTION PORT-A-CATH;  Surgeon: Abigail Miyamoto, MD;  Location: [facility] OR;  Service: General;  Laterality: Left;   SENTINEL NODE BIOPSY Bilateral 02/09/2022   Procedure: BILATERAL SENTINEL NODE BIOPSY;  Surgeon: Abigail Miyamoto, MD;  Location: [facility] OR;  Service: General;  Laterality: Bilateral;   TOTAL MASTECTOMY Bilateral 02/09/2022   Procedure: BILATERAL TOTAL MASTECTOMY;  Surgeon: Abigail Miyamoto, MD;  Location: [facility] OR;  Service: General;  Laterality: Bilateral;   TUBAL LIGATION      SOCIAL HISTORY: Social History   Socioeconomic History   Marital status: Married    Spouse name: Not on file   Number of children: 2   Years of education: Not on file   Highest education level: Not on file  Occupational History   Not on file  Tobacco Use   Smoking status: Never   Smokeless tobacco: Not on file  Substance and Sexual Activity   Alcohol use: No   Drug use: No  Sexual activity: Not on file  Other Topics Concern   Not on file  Social History Narrative   Not on file   Social Drivers of Health   Financial Resource Strain: Low Risk  (01/30/2022)   Overall Financial Resource Strain (CARDIA)    Difficulty of Paying Living Expenses: Not very hard  Food Insecurity: No Food Insecurity (01/30/2022)   Hunger Vital Sign    Worried About Running Out of Food in the Last Year: Never true    Ran Out of Food in the Last Year: Never true  Transportation Needs: Not on file  Physical Activity: Not on file  Stress: Not on file  Social Connections: Not on file  Intimate Partner Violence: Not on file    FAMILY HISTORY: Family History  Problem Relation Age of Onset   Breast cancer Mother 40 - 17   Colon cancer Mother 63    Breast cancer Sister 49 - 109   Colon cancer Sister 53 - 78   Sleep apnea Brother    Cancer Brother        unknown type, possibly stomach cancer   Thyroid cancer Daughter 61    Review of Systems  Constitutional:  Negative for appetite change, chills, fatigue, fever and unexpected weight change.  HENT:   Negative for hearing loss, lump/mass and trouble swallowing.   Eyes:  Negative for eye problems and icterus.  Respiratory:  Negative for chest tightness, cough and shortness of breath.   Cardiovascular:  Negative for chest pain, leg swelling and palpitations.  Gastrointestinal:  Negative for abdominal distention, abdominal pain, constipation, diarrhea, nausea and vomiting.  Endocrine: Negative for hot flashes.  Genitourinary:  Negative for difficulty urinating.   Musculoskeletal:  Negative for arthralgias.  Skin:  Negative for itching and rash.  Neurological:  Negative for dizziness, extremity weakness, headaches and numbness.  Hematological:  Negative for adenopathy. Does not bruise/bleed easily.  Psychiatric/Behavioral:  Negative for depression. The patient is not nervous/anxious.       PHYSICAL EXAMINATION     Vitals:   01/11/23 1314  BP: (!) 152/55  Pulse: 63  Resp: 17  Temp: 98 F (36.7 C)  SpO2: 98%    Physical Exam Constitutional:      General: She is not in acute distress.    Appearance: Normal appearance. She is not toxic-appearing.  HENT:     Head: Normocephalic and atraumatic.     Mouth/Throat:     Mouth: Mucous membranes are moist.     Pharynx: Oropharynx is clear. No oropharyngeal exudate or posterior oropharyngeal erythema.  Eyes:     General: No scleral icterus. Cardiovascular:     Rate and Rhythm: Normal rate and regular rhythm.     Pulses: Normal pulses.     Heart sounds: Normal heart sounds.  Pulmonary:     Effort: Pulmonary effort is normal.     Breath sounds: Normal breath sounds.  Abdominal:     General: Abdomen is flat. Bowel sounds are  normal. There is no distension.     Palpations: Abdomen is soft.     Tenderness: There is no abdominal tenderness.  Musculoskeletal:        General: No swelling.     Cervical back: Neck supple.  Lymphadenopathy:     Cervical: No cervical adenopathy.  Skin:    General: Skin is warm and dry.     Findings: No rash.  Neurological:     General: No focal deficit present.  Mental Status: She is alert.  Psychiatric:        Mood and Affect: Mood normal.        Behavior: Behavior normal.     LABORATORY DATA:  CBC    Component Value Date/Time   WBC 8.2 12/22/2022 1059   WBC 10.9 (H) 11/23/2022 2235   RBC 4.89 12/22/2022 1059   HGB 12.7 12/22/2022 1059   HCT 40.4 12/22/2022 1059   PLT 281 12/22/2022 1059   MCV 82.6 12/22/2022 1059   MCH 26.0 12/22/2022 1059   MCHC 31.4 12/22/2022 1059   RDW 14.6 12/22/2022 1059   LYMPHSABS 3.4 12/22/2022 1059   MONOABS 0.9 12/22/2022 1059   EOSABS 0.3 12/22/2022 1059   BASOSABS 0.0 12/22/2022 1059    CMP     Component Value Date/Time   NA 139 12/22/2022 1059   K 3.9 12/22/2022 1059   CL 105 12/22/2022 1059   CO2 29 12/22/2022 1059   GLUCOSE 92 12/22/2022 1059   BUN 14 12/22/2022 1059   CREATININE 0.83 12/22/2022 1059   CALCIUM 9.5 12/22/2022 1059   PROT 7.6 12/22/2022 1059   ALBUMIN 3.9 12/22/2022 1059   AST 21 12/22/2022 1059   ALT 22 12/22/2022 1059   ALKPHOS 90 12/22/2022 1059   BILITOT 0.5 12/22/2022 1059   GFRNONAA >60 12/22/2022 1059   GFRAA >60 02/07/2015 0439      ASSESSMENT and THERAPY PLAN:   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   She has bilateral breast cancer.  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   On the left side, she has Her 2 amplified. She is now on anastrozole and herceptin ( will complete 1 yr in Feb 2025) She is tolerating this extremely well.   Shingles Resolved with no residual pain or complications. -No further action required.  Osteopenia Last bone density scan in July showed osteopenia. Patient is taking Vitamin D and walking for exercise. -Continue Vitamin D supplementation and regular exercise. -Plan for follow-up bone density scan as per guidelines.  Cardiac Function Last echocardiogram was in October and was normal. Patient is due for a follow-up echocardiogram. -Order echocardiogram for January.  Follow-up Next appointment scheduled for February 10th, coinciding with the last Herceptin infusion.   All questions were answered. The patient knows to call the clinic with any problems, questions or concerns. We can certainly see the patient much sooner if necessary.

## 2023-01-11 NOTE — Assessment & Plan Note (Addendum)
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   She has bilateral breast cancer.  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  On the left side, she has Her 2 amplified. She is now on anastrozole and herceptin ( will complete 1 yr in Feb 2025) She is tolerating this extremely well.   Shingles Resolved with no residual pain or complications. -No further action required.  Osteopenia Last bone density scan in July showed osteopenia. Patient is taking Vitamin D and walking for exercise. -Continue Vitamin D supplementation and regular exercise. -Plan for follow-up bone density scan as per guidelines.  Cardiac Function Last echocardiogram was in October and was normal. Patient is due for a follow-up echocardiogram. -Order echocardiogram for January.  Follow-up Next appointment scheduled for February 10th, coinciding with the last Herceptin infusion.

## 2023-01-12 ENCOUNTER — Ambulatory Visit: Payer: Medicare HMO

## 2023-01-12 ENCOUNTER — Ambulatory Visit: Payer: Medicare HMO | Admitting: Hematology and Oncology

## 2023-01-25 ENCOUNTER — Ambulatory Visit (HOSPITAL_COMMUNITY)
Admission: RE | Admit: 2023-01-25 | Discharge: 2023-01-25 | Disposition: A | Discharge status: Home / Self Care | Payer: Medicare HMO | Source: Ambulatory Visit | Attending: Hematology and Oncology | Admitting: Hematology and Oncology

## 2023-01-25 DIAGNOSIS — Z0189 Encounter for other specified special examinations: Secondary | ICD-10-CM

## 2023-01-25 DIAGNOSIS — Z79899 Other long term (current) drug therapy: Secondary | ICD-10-CM | POA: Diagnosis not present

## 2023-01-25 DIAGNOSIS — I10 Essential (primary) hypertension: Secondary | ICD-10-CM | POA: Insufficient documentation

## 2023-01-25 DIAGNOSIS — C50912 Malignant neoplasm of unspecified site of left female breast: Secondary | ICD-10-CM | POA: Diagnosis not present

## 2023-01-25 DIAGNOSIS — C50911 Malignant neoplasm of unspecified site of right female breast: Secondary | ICD-10-CM | POA: Insufficient documentation

## 2023-01-25 LAB — ECHOCARDIOGRAM COMPLETE
AR max vel: 2.21 cm2
AV Area VTI: 2.17 cm2
AV Area mean vel: 2 cm2
AV Mean grad: 4 mm[Hg]
AV Peak grad: 6.9 mm[Hg]
Ao pk vel: 1.31 m/s
Area-P 1/2: 3.17 cm2
S' Lateral: 2.6 cm

## 2023-01-26 DIAGNOSIS — J300 Vasomotor rhinitis: Secondary | ICD-10-CM | POA: Diagnosis not present

## 2023-01-26 DIAGNOSIS — H1045 Other chronic allergic conjunctivitis: Secondary | ICD-10-CM | POA: Diagnosis not present

## 2023-01-26 DIAGNOSIS — J3089 Other allergic rhinitis: Secondary | ICD-10-CM | POA: Diagnosis not present

## 2023-01-26 DIAGNOSIS — J4530 Mild persistent asthma, uncomplicated: Secondary | ICD-10-CM | POA: Diagnosis not present

## 2023-02-01 ENCOUNTER — Inpatient Hospital Stay: Payer: Medicare HMO | Attending: Hematology and Oncology

## 2023-02-01 ENCOUNTER — Inpatient Hospital Stay (HOSPITAL_BASED_OUTPATIENT_CLINIC_OR_DEPARTMENT_OTHER): Payer: Medicare HMO | Admitting: Hematology and Oncology

## 2023-02-01 VITALS — BP 152/79 | HR 63 | Temp 98.3°F | Resp 17 | Wt 188.5 lb

## 2023-02-01 DIAGNOSIS — Z1732 Human epidermal growth factor receptor 2 negative status: Secondary | ICD-10-CM | POA: Diagnosis not present

## 2023-02-01 DIAGNOSIS — C50912 Malignant neoplasm of unspecified site of left female breast: Secondary | ICD-10-CM

## 2023-02-01 DIAGNOSIS — Z5112 Encounter for antineoplastic immunotherapy: Secondary | ICD-10-CM | POA: Diagnosis present

## 2023-02-01 DIAGNOSIS — Z170 Estrogen receptor positive status [ER+]: Secondary | ICD-10-CM | POA: Diagnosis not present

## 2023-02-01 DIAGNOSIS — Z1721 Progesterone receptor positive status: Secondary | ICD-10-CM | POA: Diagnosis not present

## 2023-02-01 DIAGNOSIS — C50911 Malignant neoplasm of unspecified site of right female breast: Secondary | ICD-10-CM | POA: Diagnosis not present

## 2023-02-01 DIAGNOSIS — Z79811 Long term (current) use of aromatase inhibitors: Secondary | ICD-10-CM | POA: Insufficient documentation

## 2023-02-01 DIAGNOSIS — C50411 Malignant neoplasm of upper-outer quadrant of right female breast: Secondary | ICD-10-CM | POA: Diagnosis present

## 2023-02-01 DIAGNOSIS — Z9013 Acquired absence of bilateral breasts and nipples: Secondary | ICD-10-CM | POA: Diagnosis not present

## 2023-02-01 MED ORDER — TRASTUZUMAB-ANNS CHEMO 150 MG IV SOLR
6.0000 mg/kg | Freq: Once | INTRAVENOUS | Status: AC
  Administered 2023-02-01: 504 mg via INTRAVENOUS
  Filled 2023-02-01: qty 24

## 2023-02-01 MED ORDER — HEPARIN SOD (PORK) LOCK FLUSH 100 UNIT/ML IV SOLN
500.0000 [IU] | Freq: Once | INTRAVENOUS | Status: AC | PRN
  Administered 2023-02-01: 500 [IU]

## 2023-02-01 MED ORDER — SODIUM CHLORIDE 0.9 % IV SOLN: Freq: Once | INTRAVENOUS | Status: AC

## 2023-02-01 MED ORDER — DIPHENHYDRAMINE HCL 25 MG PO CAPS
50.0000 mg | ORAL_CAPSULE | Freq: Once | ORAL | Status: AC
  Administered 2023-02-01: 50 mg via ORAL
  Filled 2023-02-01: qty 2

## 2023-02-01 MED ORDER — ACETAMINOPHEN 325 MG PO TABS
650.0000 mg | ORAL_TABLET | Freq: Once | ORAL | Status: AC
  Administered 2023-02-01: 650 mg via ORAL
  Filled 2023-02-01: qty 2

## 2023-02-01 MED ORDER — SODIUM CHLORIDE 0.9% FLUSH
10.0000 mL | INTRAVENOUS | Status: DC | PRN
Start: 2023-02-01 — End: 2023-02-01
  Administered 2023-02-01: 10 mL

## 2023-02-01 NOTE — Patient Instructions (Signed)
 [facility] - A DEPT OF [facility]  Discharge Instructions: Thank you for choosing [facility] to provide your oncology and hematology care.   If you have a lab appointment with the Cancer Center, please go directly to the Cancer Center and check in at the registration area.   Wear comfortable clothing and clothing appropriate for easy access to any Portacath or PICC line.   We strive to give you quality time with your provider. You may need to reschedule your appointment if you arrive late (15 or more minutes).  Arriving late affects you and other patients whose appointments are after yours.  Also, if you miss three or more appointments without notifying the office, you may be dismissed from the clinic at the provider's discretion.      For prescription refill requests, have your pharmacy contact our office and allow 72 hours for refills to be completed.    Today you received the following chemotherapy and/or immunotherapy agents : Trastuzumab      To help prevent nausea and vomiting after your treatment, we encourage you to take your nausea medication as directed.  BELOW ARE SYMPTOMS THAT SHOULD BE REPORTED IMMEDIATELY: *FEVER GREATER THAN 100.4 F (38 C) OR HIGHER *CHILLS OR SWEATING *NAUSEA AND VOMITING THAT IS NOT CONTROLLED WITH YOUR NAUSEA MEDICATION *UNUSUAL SHORTNESS OF BREATH *UNUSUAL BRUISING OR BLEEDING *URINARY PROBLEMS (pain or burning when urinating, or frequent urination) *BOWEL PROBLEMS (unusual diarrhea, constipation, pain near the anus) TENDERNESS IN MOUTH AND THROAT WITH OR WITHOUT PRESENCE OF ULCERS (sore throat, sores in mouth, or a toothache) UNUSUAL RASH, SWELLING OR PAIN  UNUSUAL VAGINAL DISCHARGE OR ITCHING   Items with * indicate a potential emergency and should be followed up as soon as possible or go to the Emergency Department if any problems should occur.  Please show the CHEMOTHERAPY ALERT CARD or  IMMUNOTHERAPY ALERT CARD at check-in to the Emergency Department and triage nurse.  Should you have questions after your visit or need to cancel or reschedule your appointment, please contact [facility] - A DEPT OF [facility]  Dept: [phone]  and follow the prompts.  Office hours are 8:00 a.m. to 4:30 p.m. Monday - Friday. Please note that voicemails left after 4:00 p.m. may not be returned until the following business day.  We are closed weekends and major holidays. You have access to a nurse at all times for urgent questions. Please call the main number to the clinic Dept: [phone] and follow the prompts.   For any non-urgent questions, you may also contact your provider using MyChart. We now offer e-Visits for anyone 24 and older to request care online for non-urgent symptoms. For details visit [email or web address].   Also download the MyChart app! Go to the app store, search "MyChart", open the app, select [facility], and log in with your MyChart username and password.

## 2023-02-01 NOTE — Progress Notes (Signed)
[facility] Cancer Follow up:    Wendy Greathouse, MD [address] 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 This is a highly suspicious finding for which ultrasound-guided biopsy is recommended. Adjacent/nearly contiguous complex cystic and solid mass within the RIGHT breast at the 9:30 o'clock axis, 3 cm from the nipple, measuring 1.3 cm. This is a suspicious finding for which ultrasound-guided biopsy is recommended. Highly suspicious calcifications within the inner LEFT breast, with a segmental distribution, measuring 5.7 cm extent. Recommend 2 site stereotactic biopsy to include the more posterior calcifications and the more anterior/retroareolar calcifications.    12/30/2021 Pathology Results   Left breast needle core biopsy from post extent microcalcs showed IDC, overall grade 3.  Prognostics showed ER 100% positive, strong staining intensity, PR 100% positive, strong staining intensity, Ki 67 of 20%. 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  Second area in the right breast needle core biopsy coil clip also showed intermediate grade invasive mammary carcinoma.  Prognostics showed ER 100% positive strong staining PR 15% positive strong staining Ki-67 of 10% and HER2 negative by FISH.  Coil clip prognostic showed ER 95% positive strong staining PR 95% positive strong staining Ki-67 of 30% and HER2 negative by [facility]   02/09/2022 Surgery   Bilateral mastectomies Right breast: ILC, intermediate grade, 1.5cm, LCIS, DCIS, 1LN identifed in breast tissue and negative, T1c, N0   02/09/2022 Cancer Staging   Staging form: Breast, AJCC 8th Edition - Pathologic stage from 02/09/2022: Stage IA (pT1c, pN0, cM0, G2, ER+, PR+, HER2-) - Signed by Loa Socks, NP on 05/05/2022 Stage prefix: Initial diagnosis Histologic grading system: 3 grade system   03/27/2022 -  Chemotherapy   Patient is on Treatment Plan : BREAST MAINTENANCE Trastuzumab IV (6) or SQ (600) D1 q21d x 13 cycles     03/2022 -  Anti-estrogen oral therapy   Anastrozole   Invasive ductal carcinoma of left breast (HCC)  02/09/2022 Surgery   Bilateral Mastectomies: Left breast mastectomy: IDC, g2, 0.3cm, DCIS, ILC intermediate grade, 0.6cm, LCIS, 1SLN + for ITC, 4 SLN negative for malignancy   02/09/2022 Cancer Staging   Staging form: Breast, AJCC 8th Edition - Pathologic stage from 02/09/2022: Stage IA (pT1b, pN0(i+), cM0, G2, ER+, PR+, HER2+) - Signed by Loa Socks, NP on 05/05/2022 Histologic grading system: 3 grade system   03/27/2022 -  Chemotherapy   Patient is on Treatment Plan : BREAST MAINTENANCE Trastuzumab IV (6) or SQ (600) D1 q21d x 13 cycles     03/2022 -  Anti-estrogen oral therapy   Anastrozole      CURRENT THERAPY: Anastrozole/Herceptin  INTERVAL HISTORY:  [name] 78 y.o. female returns for f/u on Anastrozole and Herceptin.   Discussed the use of AI scribe software for clinical note transcription with the patient, who gave verbal consent to proceed.  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  MEDICAL HISTORY: Past Medical History:  Diagnosis Date   Asthma    Deafness in left ear    WEARS HEARING AID LEFT EAR   GERD (gastroesophageal reflux disease)    Hypertension    Pneumonia    in past x 2    SURGICAL HISTORY: Past Surgical History:  Procedure Laterality Date   ABDOMINAL HYSTERECTOMY     BREAST BIOPSY Left 12/30/2021   MM LT BREAST BX W LOC DEV 1ST LESION IMAGE BX SPEC STEREO GUIDE 12/30/2021 [facility]   BREAST BIOPSY Right 01/02/2022   [place] RT BREAST BX W LOC DEV 1ST LESION IMG BX SPEC US GUIDE 01/02/2022 [facility]   BREAST BIOPSY Right 01/02/2022   [place] RT BREAST BX W LOC DEV EA ADD LESION IMG BX SPEC US GUIDE 01/02/2022 [facility]   BREAST BIOPSY Left 12/30/2021   MM LT BREAST BX W LOC DEV EA AD LESION IMG BX SPEC STEREO GUIDE 12/30/2021 [facility]   CHOLECYSTECTOMY     EYE SURGERY  2016   bilateral cataract surgery   NEPHRECTOMY Left 02/04/2015   Procedure: OPEN RADICAL NEPHRECTOMY;  Surgeon: Heloise Purpura, MD;  Location: [facility];  Service: Urology;  Laterality: Left;   PORTACATH PLACEMENT Left 02/09/2022   Procedure: INSERTION PORT-A-CATH;  Surgeon: Abigail Miyamoto, MD;   Location: [facility] OR;  Service: General;  Laterality: Left;   SENTINEL NODE BIOPSY Bilateral 02/09/2022   Procedure: BILATERAL SENTINEL NODE BIOPSY;  Surgeon: Abigail Miyamoto, MD;  Location: [facility] OR;  Service: General;  Laterality: Bilateral;   TOTAL MASTECTOMY Bilateral 02/09/2022   Procedure: BILATERAL TOTAL MASTECTOMY;  Surgeon: Abigail Miyamoto, MD;  Location: [facility] OR;  Service: General;  Laterality: Bilateral;   TUBAL LIGATION      SOCIAL HISTORY: Social History   Socioeconomic History   Marital status: Married    Spouse name: Not on file   Number of children: 2   Years of education: Not on file   Highest education level: Not on file  Occupational History   Not on file  Tobacco Use   Smoking status: Never   Smokeless tobacco: Not on file  Substance and Sexual Activity   Alcohol use: No   Drug use: No   Sexual activity: Not on file  Other Topics Concern   Not on file  Social History Narrative   Not on file   Social Drivers of Health   Financial Resource Strain: Low Risk  (01/30/2022)   Overall Financial Resource Strain (CARDIA)  Difficulty of Paying Living Expenses: Not very hard  Food Insecurity: No Food Insecurity (01/30/2022)   Hunger Vital Sign    Worried About Running Out of Food in the Last Year: Never true    Ran Out of Food in the Last Year: Never true  Transportation Needs: Not on file  Physical Activity: Not on file  Stress: Not on file  Social Connections: Not on file  Intimate Partner Violence: Not on file    FAMILY HISTORY: Family History  Problem Relation Age of Onset   Breast cancer Mother 88 - 60   Colon cancer Mother 36   Breast cancer Sister 50 - 61   Colon cancer Sister 89 - 9   Sleep apnea Brother    Cancer Brother        unknown type, possibly stomach cancer   Thyroid cancer Daughter 37    Review of Systems  Constitutional:  Negative for appetite change, chills, fatigue, fever and unexpected weight change.  HENT:   Negative for hearing  loss, lump/mass and trouble swallowing.   Eyes:  Negative for eye problems and icterus.  Respiratory:  Negative for chest tightness, cough and shortness of breath.   Cardiovascular:  Negative for chest pain, leg swelling and palpitations.  Gastrointestinal:  Negative for abdominal distention, abdominal pain, constipation, diarrhea, nausea and vomiting.  Endocrine: Negative for hot flashes.  Genitourinary:  Negative for difficulty urinating.   Musculoskeletal:  Negative for arthralgias.  Skin:  Negative for itching and rash.  Neurological:  Negative for dizziness, extremity weakness, headaches and numbness.  Hematological:  Negative for adenopathy. Does not bruise/bleed easily.  Psychiatric/Behavioral:  Negative for depression. The patient is not nervous/anxious.       PHYSICAL EXAMINATION     Vitals:   02/01/23 0823 02/01/23 0824  BP: (!) 154/70 (!) 152/79  Pulse: 63   Resp: 17   Temp: 98.3 F (36.8 C)   SpO2: 97%     Physical Exam Constitutional:      General: She is not in acute distress.    Appearance: Normal appearance. She is not toxic-appearing.  HENT:     Head: Normocephalic and atraumatic.     Mouth/Throat:     Mouth: Mucous membranes are moist.     Pharynx: Oropharynx is clear. No oropharyngeal exudate or posterior oropharyngeal erythema.  Eyes:     General: No scleral icterus. Cardiovascular:     Rate and Rhythm: Normal rate and regular rhythm.     Pulses: Normal pulses.     Heart sounds: Normal heart sounds.  Pulmonary:     Effort: Pulmonary effort is normal.     Breath sounds: Normal breath sounds.  Abdominal:     General: Abdomen is flat. Bowel sounds are normal. There is no distension.     Palpations: Abdomen is soft.     Tenderness: There is no abdominal tenderness.  Musculoskeletal:        General: No swelling.     Cervical back: Neck supple.  Lymphadenopathy:     Cervical: No cervical adenopathy.  Skin:    General: Skin is warm and dry.      Findings: No rash.  Neurological:     General: No focal deficit present.     Mental Status: She is alert.  Psychiatric:        Mood and Affect: Mood normal.        Behavior: Behavior normal.     LABORATORY DATA:  CBC  Component Value Date/Time   WBC 8.2 12/22/2022 1059   WBC 10.9 (H) 11/23/2022 2235   RBC 4.89 12/22/2022 1059   HGB 12.7 12/22/2022 1059   HCT 40.4 12/22/2022 1059   PLT 281 12/22/2022 1059   MCV 82.6 12/22/2022 1059   MCH 26.0 12/22/2022 1059   MCHC 31.4 12/22/2022 1059   RDW 14.6 12/22/2022 1059   LYMPHSABS 3.4 12/22/2022 1059   MONOABS 0.9 12/22/2022 1059   EOSABS 0.3 12/22/2022 1059   BASOSABS 0.0 12/22/2022 1059    CMP     Component Value Date/Time   NA 139 12/22/2022 1059   K 3.9 12/22/2022 1059   CL 105 12/22/2022 1059   CO2 29 12/22/2022 1059   GLUCOSE 92 12/22/2022 1059   BUN 14 12/22/2022 1059   CREATININE 0.83 12/22/2022 1059   CALCIUM 9.5 12/22/2022 1059   PROT 7.6 12/22/2022 1059   ALBUMIN 3.9 12/22/2022 1059   AST 21 12/22/2022 1059   ALT 22 12/22/2022 1059   ALKPHOS 90 12/22/2022 1059   BILITOT 0.5 12/22/2022 1059   GFRNONAA >60 12/22/2022 1059   GFRAA >60 02/07/2015 0439      ASSESSMENT and THERAPY PLAN:   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   She had bilateral breast cancer.  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  On the left side, she has Her 2 amplified. She is now on anastrozole and herceptin ( will complete 1 yr in Feb 2025) She is tolerating this extremely well.  Osteopenia Last bone density scan in July appears well. -Continue Vitamin D supplementation and regular exercise. -Plan for follow-up bone  density scan as per guidelines.  General Health Maintenance Patient received RSV shot. -No specific plan needed at this time.  Port Removal Patient inquired about removal of port after completion of Herceptin treatment. -Contact Dr. Magnus Ivan to arrange port removal after completion of Herceptin treatment. FU in 6months.  Rachel Moulds MD    All questions were answered. The patient knows to call the clinic with any problems, questions or concerns. We can certainly see the patient much sooner if necessary.

## 2023-02-01 NOTE — Assessment & Plan Note (Signed)
This is a very pleasant 78 year old female patient with bilateral breast cancer, right-sided biopsy showing invasive lobular cancer, ER/PR positive HER2 negative referred to medical oncology for recommendations.   She had bilateral breast cancer.  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  On the left side, she has Her 2 amplified. She is now on anastrozole and herceptin ( will complete 1 yr in Feb 2025) She is tolerating this extremely well.  Osteopenia Last bone density scan in July appears well. -Continue Vitamin D supplementation and regular exercise. -Plan for follow-up bone density scan as per guidelines.  General Health Maintenance Patient received RSV shot. -No specific plan needed at this time.  Port Removal Patient inquired about removal of port after completion of Herceptin treatment. -Contact Dr. Magnus Ivan to arrange port removal after completion of Herceptin treatment. FU in 6months.  Rachel Moulds MD

## 2023-02-05 ENCOUNTER — Other Ambulatory Visit: Payer: Self-pay | Admitting: Surgery

## 2023-02-08 ENCOUNTER — Encounter: Payer: Self-pay | Admitting: *Deleted

## 2023-02-08 DIAGNOSIS — C50912 Malignant neoplasm of unspecified site of left female breast: Secondary | ICD-10-CM

## 2023-02-08 DIAGNOSIS — C50911 Malignant neoplasm of unspecified site of right female breast: Secondary | ICD-10-CM

## 2023-02-22 ENCOUNTER — Ambulatory Visit: Payer: Medicare HMO | Admitting: Hematology and Oncology

## 2023-02-22 ENCOUNTER — Other Ambulatory Visit: Payer: Self-pay

## 2023-02-22 ENCOUNTER — Inpatient Hospital Stay: Payer: Medicare HMO | Attending: Hematology and Oncology

## 2023-02-22 VITALS — BP 158/71 | HR 62 | Temp 98.0°F | Resp 18 | Wt 188.0 lb

## 2023-02-22 DIAGNOSIS — C50411 Malignant neoplasm of upper-outer quadrant of right female breast: Secondary | ICD-10-CM | POA: Insufficient documentation

## 2023-02-22 DIAGNOSIS — Z9013 Acquired absence of bilateral breasts and nipples: Secondary | ICD-10-CM | POA: Diagnosis not present

## 2023-02-22 DIAGNOSIS — Z5112 Encounter for antineoplastic immunotherapy: Secondary | ICD-10-CM | POA: Diagnosis present

## 2023-02-22 DIAGNOSIS — Z1721 Progesterone receptor positive status: Secondary | ICD-10-CM | POA: Insufficient documentation

## 2023-02-22 DIAGNOSIS — Z79811 Long term (current) use of aromatase inhibitors: Secondary | ICD-10-CM | POA: Diagnosis not present

## 2023-02-22 DIAGNOSIS — C50911 Malignant neoplasm of unspecified site of right female breast: Secondary | ICD-10-CM

## 2023-02-22 DIAGNOSIS — Z170 Estrogen receptor positive status [ER+]: Secondary | ICD-10-CM | POA: Insufficient documentation

## 2023-02-22 DIAGNOSIS — C50912 Malignant neoplasm of unspecified site of left female breast: Secondary | ICD-10-CM

## 2023-02-22 MED ORDER — TRASTUZUMAB-ANNS CHEMO 150 MG IV SOLR
6.0000 mg/kg | Freq: Once | INTRAVENOUS | Status: AC
  Administered 2023-02-22: 504 mg via INTRAVENOUS
  Filled 2023-02-22: qty 24

## 2023-02-22 MED ORDER — SODIUM CHLORIDE 0.9 % IV SOLN: Freq: Once | INTRAVENOUS | Status: AC

## 2023-02-22 MED ORDER — ACETAMINOPHEN 325 MG PO TABS
650.0000 mg | ORAL_TABLET | Freq: Once | ORAL | Status: AC
Start: 2023-02-22 — End: 2023-02-22
  Administered 2023-02-22: 650 mg via ORAL
  Filled 2023-02-22: qty 2

## 2023-02-22 MED ORDER — DIPHENHYDRAMINE HCL 25 MG PO CAPS
50.0000 mg | ORAL_CAPSULE | Freq: Once | ORAL | Status: AC
  Administered 2023-02-22: 50 mg via ORAL
  Filled 2023-02-22: qty 2

## 2023-02-22 NOTE — Patient Instructions (Signed)
 [facility] - A DEPT OF [facility]  Discharge Instructions: Thank you for choosing [facility] to provide your oncology and hematology care.   If you have a lab appointment with the Cancer Center, please go directly to the Cancer Center and check in at the registration area.   Wear comfortable clothing and clothing appropriate for easy access to any Portacath or PICC line.   We strive to give you quality time with your provider. You may need to reschedule your appointment if you arrive late (15 or more minutes).  Arriving late affects you and other patients whose appointments are after yours.  Also, if you miss three or more appointments without notifying the office, you may be dismissed from the clinic at the provider's discretion.      For prescription refill requests, have your pharmacy contact our office and allow 72 hours for refills to be completed.    Today you received the following chemotherapy and/or immunotherapy agents: trastuzumab-anns      To help prevent nausea and vomiting after your treatment, we encourage you to take your nausea medication as directed.  BELOW ARE SYMPTOMS THAT SHOULD BE REPORTED IMMEDIATELY: *FEVER GREATER THAN 100.4 F (38 C) OR HIGHER *CHILLS OR SWEATING *NAUSEA AND VOMITING THAT IS NOT CONTROLLED WITH YOUR NAUSEA MEDICATION *UNUSUAL SHORTNESS OF BREATH *UNUSUAL BRUISING OR BLEEDING *URINARY PROBLEMS (pain or burning when urinating, or frequent urination) *BOWEL PROBLEMS (unusual diarrhea, constipation, pain near the anus) TENDERNESS IN MOUTH AND THROAT WITH OR WITHOUT PRESENCE OF ULCERS (sore throat, sores in mouth, or a toothache) UNUSUAL RASH, SWELLING OR PAIN  UNUSUAL VAGINAL DISCHARGE OR ITCHING   Items with * indicate a potential emergency and should be followed up as soon as possible or go to the Emergency Department if any problems should occur.  Please show the CHEMOTHERAPY ALERT CARD or  IMMUNOTHERAPY ALERT CARD at check-in to the Emergency Department and triage nurse.  Should you have questions after your visit or need to cancel or reschedule your appointment, please contact [facility] - [facility]  Dept: [phone]  and follow the prompts.  Office hours are 8:00 a.m. to 4:30 p.m. Monday - Friday. Please note that voicemails left after 4:00 p.m. may not be returned until the following business day.  We are closed weekends and major holidays. You have access to a nurse at all times for urgent questions. Please call the main number to the clinic Dept: [phone] and follow the prompts.   For any non-urgent questions, you may also contact your provider using MyChart. We now offer e-Visits for anyone 73 and older to request care online for non-urgent symptoms. For details visit [email or web address].   Also download the MyChart app! Go to the app store, search "MyChart", open the app, select [facility], and log in with your MyChart username and password.

## 2023-02-23 NOTE — Anesthesia Preprocedure Evaluation (Addendum)
 Anesthesia Evaluation  Patient identified by MRN, date of birth, ID band Patient awake    Reviewed: Allergy & Precautions, NPO status , Patient's Chart, lab work & pertinent test results, reviewed documented beta blocker date and time   Airway Mallampati: II  TM Distance: >3 FB Neck ROM: Full    Dental  (+) Dental Advisory Given   Pulmonary asthma , sleep apnea    breath sounds clear to auscultation       Cardiovascular hypertension, Pt. on medications and Pt. on home beta blockers  Rhythm:Regular Rate:Normal     Neuro/Psych negative neurological ROS  negative psych ROS   GI/Hepatic Neg liver ROS,GERD  Controlled,,  Endo/Other  Hypothyroidism    Renal/GU negative Renal ROS  negative genitourinary   Musculoskeletal negative musculoskeletal ROS (+)    Abdominal   Peds  Hematology negative hematology ROS (+)   Anesthesia Other Findings   Reproductive/Obstetrics negative OB ROS                             Anesthesia Physical Anesthesia Plan  ASA: 2  Anesthesia Plan: MAC   Post-op Pain Management: Tylenol PO (pre-op)*   Induction:   PONV Risk Score and Plan: 2 and Propofol infusion and TIVA  Airway Management Planned: Natural Airway and Simple Face Mask  Additional Equipment: None  Intra-op Plan:   Post-operative Plan:   Informed Consent: I have reviewed the patients History and Physical, chart, labs and discussed the procedure including the risks, benefits and alternatives for the proposed anesthesia with the patient or authorized representative who has indicated his/her understanding and acceptance.     Dental advisory given  Plan Discussed with: CRNA  Anesthesia Plan Comments:        Anesthesia Quick Evaluation

## 2023-02-25 ENCOUNTER — Telehealth: Payer: Self-pay | Admitting: Adult Health

## 2023-02-25 NOTE — Telephone Encounter (Signed)
[name]

## 2023-02-26 DIAGNOSIS — I10 Essential (primary) hypertension: Secondary | ICD-10-CM | POA: Diagnosis not present

## 2023-02-26 DIAGNOSIS — G4733 Obstructive sleep apnea (adult) (pediatric): Secondary | ICD-10-CM | POA: Diagnosis not present

## 2023-03-01 ENCOUNTER — Encounter (HOSPITAL_BASED_OUTPATIENT_CLINIC_OR_DEPARTMENT_OTHER)
Admission: RE | Admit: 2023-03-01 | Discharge: 2023-03-01 | Disposition: A | Discharge status: Home / Self Care | Payer: Medicare HMO | Source: Ambulatory Visit | Attending: Surgery | Admitting: Surgery

## 2023-03-01 ENCOUNTER — Encounter (HOSPITAL_BASED_OUTPATIENT_CLINIC_OR_DEPARTMENT_OTHER): Payer: Self-pay | Admitting: Surgery

## 2023-03-01 ENCOUNTER — Other Ambulatory Visit: Payer: Self-pay

## 2023-03-01 DIAGNOSIS — Z01812 Encounter for preprocedural laboratory examination: Secondary | ICD-10-CM | POA: Diagnosis not present

## 2023-03-01 DIAGNOSIS — I10 Essential (primary) hypertension: Secondary | ICD-10-CM | POA: Insufficient documentation

## 2023-03-01 LAB — BASIC METABOLIC PANEL
Anion gap: 6 (ref 5–15)
BUN: 15 mg/dL (ref 8–23)
CO2: 28 mmol/L (ref 22–32)
Calcium: 9.1 mg/dL (ref 8.9–10.3)
Chloride: 104 mmol/L (ref 98–111)
Creatinine, Ser: 0.86 mg/dL (ref 0.44–1.00)
GFR, Estimated: >60 mL/min (ref >60)
Glucose, Bld: 87 mg/dL (ref 70–99)
Potassium: 4.4 mmol/L (ref 3.5–5.1)
Sodium: 138 mmol/L (ref 135–145)

## 2023-03-01 MED ORDER — CHLORHEXIDINE GLUCONATE CLOTH 2 % EX PADS: 6.0000 | MEDICATED_PAD | Freq: Once | CUTANEOUS | Status: DC

## 2023-03-01 MED ORDER — CHLORHEXIDINE GLUCONATE CLOTH 2 % EX PADS
6.0000 | MEDICATED_PAD | Freq: Once | CUTANEOUS | Status: DC
Start: 2023-03-01 — End: 2023-03-08

## 2023-03-01 NOTE — Progress Notes (Signed)
   03/01/23 0959  PAT Phone Screen  Is the patient taking a GLP-1 receptor agonist? No  Do You Have Diabetes? No  Do You Have Hypertension? Yes  Have You Ever Been to the ER for Asthma? No  Have You Taken Oral Steroids in the Past 3 Months? No  Do you Take Phenteramine or any Other Diet Drugs? No  Recent  Lab Work, EKG, CXR? (S)  Yes  Where was this test performed? (S)  CXR 11/23/22, Echo 01/25/23 (EF 55-60%), EKG 11/23/22  Do you have a history of heart problems? No  Any Recent Hospitalizations? No  Height 5\' 1"  (1.549 m)  Weight 83.9 kg  Pat Appointment Scheduled (S)  Yes (BMP)

## 2023-03-01 NOTE — Progress Notes (Signed)

## 2023-03-07 NOTE — H&P (Signed)
 [name] is an 78 y.o. female.   Chief Complaint: Port-A-Cath no longer needed HPI: This is a 78 year old female who is completed her treatment for breast cancer.  She has a left subclavian Port-A-Cath in place.  She has undergone bilateral mastectomies.  The Port-A-Cath is no longer needed so we will be removed.  She is currently doing well  Past Medical History:  Diagnosis Date   Asthma    Deafness in left ear    WEARS HEARING AID LEFT EAR   GERD (gastroesophageal reflux disease)    Hypertension    Pneumonia    in past x 2   Sleep apnea     Past Surgical History:  Procedure Laterality Date   ABDOMINAL HYSTERECTOMY     BREAST BIOPSY Left 12/30/2021   MM LT BREAST BX W LOC DEV 1ST LESION IMAGE BX SPEC STEREO GUIDE 12/30/2021 [facility]   BREAST BIOPSY Right 01/02/2022   [place] RT BREAST BX W LOC DEV 1ST LESION IMG BX SPEC US GUIDE 01/02/2022 [facility]   BREAST BIOPSY Right 01/02/2022   [place] RT BREAST BX W LOC DEV EA ADD LESION IMG BX SPEC US GUIDE 01/02/2022 [facility]   BREAST BIOPSY Left 12/30/2021   MM LT BREAST BX W LOC DEV EA AD LESION IMG BX SPEC STEREO GUIDE 12/30/2021 [facility]   CHOLECYSTECTOMY     EYE SURGERY  2016   bilateral cataract surgery   NEPHRECTOMY Left 02/04/2015   Procedure: OPEN RADICAL NEPHRECTOMY;  Surgeon: Heloise Purpura, MD;  Location: [facility];  Service: Urology;  Laterality: Left;   PORTACATH PLACEMENT Left 02/09/2022   Procedure: INSERTION PORT-A-CATH;  Surgeon: Abigail Miyamoto, MD;  Location: [facility] OR;  Service: General;  Laterality: Left;   SENTINEL NODE BIOPSY Bilateral 02/09/2022   Procedure: BILATERAL SENTINEL NODE BIOPSY;  Surgeon: Abigail Miyamoto, MD;  Location: [facility] OR;  Service: General;  Laterality: Bilateral;   TOTAL MASTECTOMY Bilateral 02/09/2022   Procedure: BILATERAL TOTAL MASTECTOMY;  Surgeon: Abigail Miyamoto, MD;  Location: [facility] OR;  Service: General;  Laterality: Bilateral;   TUBAL LIGATION      Family  History  Problem Relation Age of Onset   Breast cancer Mother 16 - 49   Colon cancer Mother 21   Breast cancer Sister 2 - 28   Colon cancer Sister 40 - 69   Sleep apnea Brother    Cancer Brother        unknown type, possibly stomach cancer   Thyroid cancer Daughter 80   Social History:  reports that she has never smoked. She does not have any smokeless tobacco history on file. She reports that she does not drink alcohol and does not use drugs.  Allergies:  Allergies  Allergen Reactions   Aspirin     Other Reaction(s): nausea (regular aspirin (325mg ) only)   Hydrocodone-Acetaminophen Nausea And Vomiting   Levofloxacin Nausea And Vomiting   Tramadol Nausea And Vomiting   Codeine Nausea Only and Rash    No medications prior to admission.    No results found for this or any previous visit (from the past 48 hours). No results found.  Review of Systems  All other systems reviewed and are negative.   Height 5\' 1"  (1.549 m), weight 83.9 kg. Physical Exam Constitutional:      Appearance: Normal appearance.  Cardiovascular:     Rate and Rhythm: Normal rate and regular rhythm.  Pulmonary:     Effort: Pulmonary effort is normal. No respiratory  distress.  Abdominal:     General: Abdomen is flat.     Palpations: Abdomen is soft.  Skin:    General: Skin is warm and dry.  Neurological:     General: No focal deficit present.     Mental Status: She is alert.  Psychiatric:        Mood and Affect: Mood normal.        Behavior: Behavior normal.      Assessment/Plan History of breast cancer with treatment completed and Port-A-Cath no longer needed  We will proceed to the operating room for Port-A-Cath removal.  I discussed the risks with the patient who agrees to proceed  Abigail Miyamoto, MD 03/07/2023, 10:10 AM

## 2023-03-08 ENCOUNTER — Ambulatory Visit (HOSPITAL_BASED_OUTPATIENT_CLINIC_OR_DEPARTMENT_OTHER): Payer: Medicare HMO | Admitting: Anesthesiology

## 2023-03-08 ENCOUNTER — Other Ambulatory Visit: Payer: Self-pay

## 2023-03-08 ENCOUNTER — Ambulatory Visit (HOSPITAL_BASED_OUTPATIENT_CLINIC_OR_DEPARTMENT_OTHER)
Admission: RE | Admit: 2023-03-08 | Discharge: 2023-03-08 | Disposition: A | Discharge status: Home / Self Care | Payer: Medicare HMO | Attending: Surgery | Admitting: Surgery

## 2023-03-08 ENCOUNTER — Encounter (HOSPITAL_BASED_OUTPATIENT_CLINIC_OR_DEPARTMENT_OTHER)
Admission: RE | Disposition: A | Discharge status: Home / Self Care | Payer: Self-pay | Source: Home / Self Care | Attending: Surgery

## 2023-03-08 ENCOUNTER — Encounter (HOSPITAL_BASED_OUTPATIENT_CLINIC_OR_DEPARTMENT_OTHER): Payer: Self-pay | Admitting: Surgery

## 2023-03-08 DIAGNOSIS — Z452 Encounter for adjustment and management of vascular access device: Secondary | ICD-10-CM

## 2023-03-08 DIAGNOSIS — Z853 Personal history of malignant neoplasm of breast: Secondary | ICD-10-CM | POA: Insufficient documentation

## 2023-03-08 DIAGNOSIS — Z79899 Other long term (current) drug therapy: Secondary | ICD-10-CM | POA: Diagnosis not present

## 2023-03-08 DIAGNOSIS — I10 Essential (primary) hypertension: Secondary | ICD-10-CM | POA: Diagnosis not present

## 2023-03-08 DIAGNOSIS — G4730 Sleep apnea, unspecified: Secondary | ICD-10-CM | POA: Diagnosis not present

## 2023-03-08 DIAGNOSIS — Z803 Family history of malignant neoplasm of breast: Secondary | ICD-10-CM | POA: Diagnosis not present

## 2023-03-08 DIAGNOSIS — J45909 Unspecified asthma, uncomplicated: Secondary | ICD-10-CM | POA: Insufficient documentation

## 2023-03-08 DIAGNOSIS — Z9013 Acquired absence of bilateral breasts and nipples: Secondary | ICD-10-CM | POA: Diagnosis not present

## 2023-03-08 HISTORY — PX: PORT-A-CATH REMOVAL: SHX5289

## 2023-03-08 HISTORY — DX: Sleep apnea, unspecified: G47.30

## 2023-03-08 SURGERY — REMOVAL PORT-A-CATH
Anesthesia: Monitor Anesthesia Care | Site: Chest

## 2023-03-08 MED ORDER — FENTANYL CITRATE (PF) 100 MCG/2ML IJ SOLN
INTRAMUSCULAR | Status: AC
  Filled 2023-03-08: qty 2

## 2023-03-08 MED ORDER — ACETAMINOPHEN 500 MG PO TABS: 1000.0000 mg | ORAL_TABLET | Freq: Once | ORAL | Status: DC

## 2023-03-08 MED ORDER — ONDANSETRON HCL 4 MG/2ML IJ SOLN: 4.0000 mg | Freq: Once | INTRAMUSCULAR | Status: DC | PRN

## 2023-03-08 MED ORDER — DEXMEDETOMIDINE HCL IN NACL 80 MCG/20ML IV SOLN
INTRAVENOUS | Status: DC | PRN
  Administered 2023-03-08: 4 ug via INTRAVENOUS

## 2023-03-08 MED ORDER — PROPOFOL 1000 MG/100ML IV EMUL
INTRAVENOUS | Status: AC
Start: 2023-03-08 — End: ?
  Filled 2023-03-08: qty 400

## 2023-03-08 MED ORDER — CEFAZOLIN SODIUM-DEXTROSE 2-4 GM/100ML-% IV SOLN
2.0000 g | INTRAVENOUS | Status: AC
  Administered 2023-03-08: 2 g via INTRAVENOUS

## 2023-03-08 MED ORDER — LIDOCAINE-EPINEPHRINE 1 %-1:100000 IJ SOLN
INTRAMUSCULAR | Status: DC | PRN
  Administered 2023-03-08: 10 mL

## 2023-03-08 MED ORDER — BUPIVACAINE-EPINEPHRINE (PF) 0.5% -1:200000 IJ SOLN
INTRAMUSCULAR | Status: AC
  Filled 2023-03-08: qty 30

## 2023-03-08 MED ORDER — ONDANSETRON HCL 4 MG/2ML IJ SOLN
INTRAMUSCULAR | Status: DC | PRN
  Administered 2023-03-08: 4 mg via INTRAVENOUS

## 2023-03-08 MED ORDER — OXYCODONE HCL 5 MG PO TABS: 5.0000 mg | ORAL_TABLET | Freq: Once | ORAL | Status: DC | PRN

## 2023-03-08 MED ORDER — PROPOFOL 500 MG/50ML IV EMUL
INTRAVENOUS | Status: DC | PRN
  Administered 2023-03-08: 150 ug/kg/min via INTRAVENOUS

## 2023-03-08 MED ORDER — ACETAMINOPHEN 500 MG PO TABS
ORAL_TABLET | ORAL | Status: AC
  Filled 2023-03-08: qty 2

## 2023-03-08 MED ORDER — FENTANYL CITRATE (PF) 100 MCG/2ML IJ SOLN
INTRAMUSCULAR | Status: DC | PRN
  Administered 2023-03-08: 25 ug via INTRAVENOUS

## 2023-03-08 MED ORDER — LIDOCAINE HCL (CARDIAC) PF 100 MG/5ML IV SOSY
PREFILLED_SYRINGE | INTRAVENOUS | Status: DC | PRN
  Administered 2023-03-08: 40 mg via INTRAVENOUS

## 2023-03-08 MED ORDER — LACTATED RINGERS IV SOLN: INTRAVENOUS | Status: DC

## 2023-03-08 MED ORDER — CEFAZOLIN SODIUM-DEXTROSE 2-4 GM/100ML-% IV SOLN
INTRAVENOUS | Status: AC
  Filled 2023-03-08: qty 100

## 2023-03-08 MED ORDER — ACETAMINOPHEN 500 MG PO TABS
1000.0000 mg | ORAL_TABLET | ORAL | Status: AC
  Administered 2023-03-08: 1000 mg via ORAL

## 2023-03-08 MED ORDER — OXYCODONE HCL 5 MG/5ML PO SOLN: 5.0000 mg | Freq: Once | ORAL | Status: DC | PRN

## 2023-03-08 MED ORDER — AMISULPRIDE (ANTIEMETIC) 5 MG/2ML IV SOLN: 10.0000 mg | Freq: Once | INTRAVENOUS | Status: DC | PRN

## 2023-03-08 MED ORDER — LIDOCAINE-EPINEPHRINE 1 %-1:100000 IJ SOLN
INTRAMUSCULAR | Status: AC
  Filled 2023-03-08: qty 1

## 2023-03-08 MED ORDER — FENTANYL CITRATE (PF) 100 MCG/2ML IJ SOLN: 25.0000 ug | INTRAMUSCULAR | Status: DC | PRN

## 2023-03-08 SURGICAL SUPPLY — 24 items
BLADE SURG 15 STRL LF DISP TIS (BLADE) ×1 IMPLANT
CHLORAPREP W/TINT 26 (MISCELLANEOUS) ×1 IMPLANT
COVER BACK TABLE 60X90IN (DRAPES) ×1 IMPLANT
COVER MAYO STAND STRL (DRAPES) ×1 IMPLANT
DERMABOND ADVANCED .7 DNX12 (GAUZE/BANDAGES/DRESSINGS) ×1 IMPLANT
DRAPE LAPAROTOMY 100X72 PEDS (DRAPES) ×1 IMPLANT
DRAPE UTILITY XL STRL (DRAPES) ×1 IMPLANT
ELECT REM PT RETURN 9FT ADLT (ELECTROSURGICAL) ×1
ELECTRODE REM PT RTRN 9FT ADLT (ELECTROSURGICAL) ×1 IMPLANT
GLOVE SURG SIGNA 7.5 PF LTX (GLOVE) ×1 IMPLANT
GOWN STRL REUS W/ TWL LRG LVL3 (GOWN DISPOSABLE) ×1 IMPLANT
GOWN STRL REUS W/ TWL XL LVL3 (GOWN DISPOSABLE) ×1 IMPLANT
NDL HYPO 25X1 1.5 SAFETY (NEEDLE) ×1 IMPLANT
NEEDLE HYPO 25X1 1.5 SAFETY (NEEDLE) ×1
NS IRRIG 1000ML POUR BTL (IV SOLUTION) IMPLANT
PACK BASIN DAY SURGERY FS (CUSTOM PROCEDURE TRAY) ×1 IMPLANT
PENCIL SMOKE EVACUATOR (MISCELLANEOUS) ×1 IMPLANT
SLEEVE SCD COMPRESS KNEE MED (STOCKING) IMPLANT
SPIKE FLUID TRANSFER (MISCELLANEOUS) IMPLANT
SUT MNCRL AB 4-0 PS2 18 (SUTURE) ×1 IMPLANT
SUT VIC AB 3-0 SH 27X BRD (SUTURE) ×1 IMPLANT
SYR BULB EAR ULCER 3OZ GRN STR (SYRINGE) IMPLANT
SYR CONTROL 10ML LL (SYRINGE) ×1 IMPLANT
TOWEL GREEN STERILE FF (TOWEL DISPOSABLE) ×1 IMPLANT

## 2023-03-08 NOTE — Interval H&P Note (Signed)
 History and Physical Interval Note:no change in H and P  03/08/2023 7:07 AM  [name]  has presented today for surgery, with the diagnosis of PORT NO LONGER NEEDED, HISTORY OF BREAST CANCER.  The various methods of treatment have been discussed with the patient and family. After consideration of risks, benefits and other options for treatment, the patient has consented to  Procedure(s): PORT-A-CATH REMOVAL (N/A) as a surgical intervention.  The patient's history has been reviewed, patient examined, no change in status, stable for surgery.  I have reviewed the patient's chart and labs.  Questions were answered to the patient's satisfaction.     Abigail Miyamoto

## 2023-03-08 NOTE — Op Note (Signed)
 PORT-A-CATH REMOVAL  Procedure Note  [name] 03/08/2023   Pre-op Diagnosis: PORT NO LONGER NEEDED, HISTORY OF BREAST CANCER     Post-op Diagnosis: same  Procedure(s): PORT-A-CATH REMOVAL  Surgeon(s): Abigail Miyamoto, MD  Anesthesia: Monitor Anesthesia Care  Staff:  Circulator: Griffin Basil, RN; McDonough-Hughes, Maceo Pro, RN Scrub Person: Rolla Etienne  Estimated Blood Loss: Minimal               Indications: This is a 78 year old female who is completed her treatment for breast cancer and no longer needs her Port-A-Cath.  The decision has been made to proceed with Port-A-Cath removal  Procedure: The patient was brought to the operating room and identified as the correct patient.  She is placed upon in the operating room table and anesthesia was induced.  Her left chest was prepped and draped in the usual sterile fashion.  I anesthetized the skin of the previous scar at the port site in the left upper chest with lidocaine.  I then made an incision with a scalpel.  I dissected down to the port which is easily identified.  I removed the Prolene suture holding the port in place.  I then easily removed the port and the entire catheter.  I closed the catheter tract with a figure-of-eight 3-0 Vicryl suture.  I then closed the subcutaneous tissue with interrupted 3-0 Vicryl sutures and closed the skin with a running 4-0 Monocryl.  Dermabond was then applied.  The patient tolerated the procedure well.  All the counts were correct at the end of the procedure.  She was then taken in a stable condition to the recovery room.          Abigail Miyamoto   Date: 03/08/2023  Time: 7:49 AM

## 2023-03-08 NOTE — Anesthesia Postprocedure Evaluation (Signed)
 Anesthesia Post Note  Patient: Anglea B Ysaguirre  Procedure(s) Performed: PORT-A-CATH REMOVAL (Chest)     Patient location during evaluation: PACU Anesthesia Type: MAC Level of consciousness: awake and alert Pain management: pain level controlled Vital Signs Assessment: post-procedure vital signs reviewed and stable Respiratory status: spontaneous breathing, nonlabored ventilation, respiratory function stable and patient connected to nasal cannula oxygen Cardiovascular status: stable and blood pressure returned to baseline Postop Assessment: no apparent nausea or vomiting Anesthetic complications: no  No notable events documented.  Last Vitals:  Vitals:   03/08/23 0815 03/08/23 0830  BP: (!) 145/59 130/60  Pulse: (!) 57 (!) 56  Resp: 12 13  Temp:  (!) 36.1 C  SpO2: 95% 95%    Last Pain:  Vitals:   03/08/23 0830  TempSrc:   PainSc: 0-No pain                 Kennieth Rad

## 2023-03-08 NOTE — Transfer of Care (Signed)
 Immediate Anesthesia Transfer of Care Note  Patient: [name]  Procedure(s) Performed: PORT-A-CATH REMOVAL (Chest)  Patient Location: PACU  Anesthesia Type:MAC  Level of Consciousness: awake and patient cooperative  Airway & Oxygen Therapy: Patient Spontanous Breathing and Patient connected to face mask oxygen  Post-op Assessment: Report given to RN and Post -op Vital signs reviewed and stable  Post vital signs: Reviewed and stable  Last Vitals:  Vitals Value Taken Time  BP 107/48 03/08/23 0754  Temp    Pulse 54 03/08/23 0757  Resp 19 03/08/23 0757  SpO2 97 % 03/08/23 0757  Vitals shown include unfiled device data.  Last Pain:  Vitals:   03/08/23 0633  TempSrc: Tympanic  PainSc: 0-No pain      Patients Stated Pain Goal: 10 (03/08/23 1829)  Complications: No notable events documented.

## 2023-03-08 NOTE — Discharge Instructions (Addendum)
 You may shower starting tomorrow  Ice pack and Tylenol for pain  No vigorous activity for 1 week  Make an appointment to see me in the office for your long-term cancer follow-up in approximately 3 months  No Tylenol before 12:30pm if needed.  Post Anesthesia Home Care Instructions  Activity: Get plenty of rest for the remainder of the day. A responsible individual must stay with you for 24 hours following the procedure.  For the next 24 hours, DO NOT: -Drive a car -Advertising copywriter -Drink alcoholic beverages -Take any medication unless instructed by your physician -Make any legal decisions or sign important papers.  Meals: Start with liquid foods such as gelatin or soup. Progress to regular foods as tolerated. Avoid greasy, spicy, heavy foods. If nausea and/or vomiting occur, drink only clear liquids until the nausea and/or vomiting subsides. Call your physician if vomiting continues.  Special Instructions/Symptoms: Your throat may feel dry or sore from the anesthesia or the breathing tube placed in your throat during surgery. If this causes discomfort, gargle with warm salt water. The discomfort should disappear within 24 hours.

## 2023-03-09 ENCOUNTER — Other Ambulatory Visit: Payer: Self-pay | Admitting: Hematology and Oncology

## 2023-03-09 ENCOUNTER — Encounter (HOSPITAL_BASED_OUTPATIENT_CLINIC_OR_DEPARTMENT_OTHER): Payer: Self-pay | Admitting: Surgery

## 2023-04-08 DIAGNOSIS — C50912 Malignant neoplasm of unspecified site of left female breast: Secondary | ICD-10-CM | POA: Diagnosis not present

## 2023-04-08 DIAGNOSIS — C50911 Malignant neoplasm of unspecified site of right female breast: Secondary | ICD-10-CM | POA: Diagnosis not present

## 2023-04-26 DIAGNOSIS — E039 Hypothyroidism, unspecified: Secondary | ICD-10-CM | POA: Diagnosis not present

## 2023-04-26 DIAGNOSIS — E559 Vitamin D deficiency, unspecified: Secondary | ICD-10-CM | POA: Diagnosis not present

## 2023-04-26 DIAGNOSIS — Z1212 Encounter for screening for malignant neoplasm of rectum: Secondary | ICD-10-CM | POA: Diagnosis not present

## 2023-04-26 DIAGNOSIS — E785 Hyperlipidemia, unspecified: Secondary | ICD-10-CM | POA: Diagnosis not present

## 2023-04-26 DIAGNOSIS — I10 Essential (primary) hypertension: Secondary | ICD-10-CM | POA: Diagnosis not present

## 2023-04-29 ENCOUNTER — Ambulatory Visit (HOSPITAL_COMMUNITY)
Admission: RE | Admit: 2023-04-29 | Discharge: 2023-04-29 | Disposition: A | Discharge status: Home / Self Care | Payer: Medicare HMO | Source: Ambulatory Visit | Attending: Hematology and Oncology | Admitting: Hematology and Oncology

## 2023-04-29 DIAGNOSIS — Z0189 Encounter for other specified special examinations: Secondary | ICD-10-CM

## 2023-04-29 DIAGNOSIS — C50911 Malignant neoplasm of unspecified site of right female breast: Secondary | ICD-10-CM

## 2023-04-29 DIAGNOSIS — C50912 Malignant neoplasm of unspecified site of left female breast: Secondary | ICD-10-CM

## 2023-04-29 DIAGNOSIS — Z9013 Acquired absence of bilateral breasts and nipples: Secondary | ICD-10-CM | POA: Diagnosis not present

## 2023-04-29 DIAGNOSIS — Z853 Personal history of malignant neoplasm of breast: Secondary | ICD-10-CM | POA: Insufficient documentation

## 2023-04-29 DIAGNOSIS — G4730 Sleep apnea, unspecified: Secondary | ICD-10-CM | POA: Diagnosis not present

## 2023-04-29 DIAGNOSIS — Z09 Encounter for follow-up examination after completed treatment for conditions other than malignant neoplasm: Secondary | ICD-10-CM | POA: Insufficient documentation

## 2023-04-29 LAB — ECHOCARDIOGRAM COMPLETE
Area-P 1/2: 3.74 cm2
S' Lateral: 2.8 cm

## 2023-05-03 DIAGNOSIS — E039 Hypothyroidism, unspecified: Secondary | ICD-10-CM | POA: Diagnosis not present

## 2023-05-03 DIAGNOSIS — Z800 Family history of malignant neoplasm of digestive organs: Secondary | ICD-10-CM | POA: Diagnosis not present

## 2023-05-03 DIAGNOSIS — R413 Other amnesia: Secondary | ICD-10-CM | POA: Diagnosis not present

## 2023-05-03 DIAGNOSIS — J45909 Unspecified asthma, uncomplicated: Secondary | ICD-10-CM | POA: Diagnosis not present

## 2023-05-03 DIAGNOSIS — M1990 Unspecified osteoarthritis, unspecified site: Secondary | ICD-10-CM | POA: Diagnosis not present

## 2023-05-03 DIAGNOSIS — R82998 Other abnormal findings in urine: Secondary | ICD-10-CM | POA: Diagnosis not present

## 2023-05-03 DIAGNOSIS — E669 Obesity, unspecified: Secondary | ICD-10-CM | POA: Diagnosis not present

## 2023-05-03 DIAGNOSIS — J302 Other seasonal allergic rhinitis: Secondary | ICD-10-CM | POA: Diagnosis not present

## 2023-05-03 DIAGNOSIS — C50911 Malignant neoplasm of unspecified site of right female breast: Secondary | ICD-10-CM | POA: Diagnosis not present

## 2023-05-03 DIAGNOSIS — E785 Hyperlipidemia, unspecified: Secondary | ICD-10-CM | POA: Diagnosis not present

## 2023-05-03 DIAGNOSIS — E559 Vitamin D deficiency, unspecified: Secondary | ICD-10-CM | POA: Diagnosis not present

## 2023-05-03 DIAGNOSIS — G4733 Obstructive sleep apnea (adult) (pediatric): Secondary | ICD-10-CM | POA: Diagnosis not present

## 2023-05-03 DIAGNOSIS — Z1339 Encounter for screening examination for other mental health and behavioral disorders: Secondary | ICD-10-CM | POA: Diagnosis not present

## 2023-05-03 DIAGNOSIS — I10 Essential (primary) hypertension: Secondary | ICD-10-CM | POA: Diagnosis not present

## 2023-05-03 DIAGNOSIS — C50912 Malignant neoplasm of unspecified site of left female breast: Secondary | ICD-10-CM | POA: Diagnosis not present

## 2023-05-03 DIAGNOSIS — Z0000 Encounter for general adult medical examination without abnormal findings: Secondary | ICD-10-CM | POA: Diagnosis not present

## 2023-05-03 DIAGNOSIS — Z1331 Encounter for screening for depression: Secondary | ICD-10-CM | POA: Diagnosis not present

## 2023-05-25 ENCOUNTER — Inpatient Hospital Stay: Payer: Medicare HMO | Attending: Hematology and Oncology | Admitting: Adult Health

## 2023-05-25 ENCOUNTER — Encounter: Payer: Self-pay | Admitting: Adult Health

## 2023-05-25 VITALS — BP 135/54 | HR 59 | Temp 97.9°F | Resp 18 | Ht 61.0 in | Wt 188.7 lb

## 2023-05-25 DIAGNOSIS — C50411 Malignant neoplasm of upper-outer quadrant of right female breast: Secondary | ICD-10-CM | POA: Insufficient documentation

## 2023-05-25 DIAGNOSIS — C50912 Malignant neoplasm of unspecified site of left female breast: Secondary | ICD-10-CM

## 2023-05-25 DIAGNOSIS — C50911 Malignant neoplasm of unspecified site of right female breast: Secondary | ICD-10-CM | POA: Diagnosis not present

## 2023-05-25 DIAGNOSIS — Z170 Estrogen receptor positive status [ER+]: Secondary | ICD-10-CM | POA: Diagnosis not present

## 2023-05-25 NOTE — Progress Notes (Signed)
 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 This is a highly suspicious finding for which ultrasound-guided biopsy is recommended. Adjacent/nearly contiguous complex cystic and solid mass within the RIGHT breast at the 9:30 o'clock axis, 3 cm from the nipple, measuring 1.3 cm. This is a suspicious finding for which ultrasound-guided biopsy is recommended. Highly suspicious calcifications within the inner LEFT breast, with a segmental distribution, measuring 5.7 cm extent. Recommend 2 site stereotactic biopsy to include the more posterior calcifications and the more anterior/retroareolar calcifications.    12/30/2021 Pathology Results   Left breast needle core biopsy from post extent microcalcs showed IDC, overall grade 3.  Prognostics showed ER 100% positive, strong staining intensity, PR 100% positive, strong staining intensity, Ki 67 of 20%. 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  Second area in the right breast needle core biopsy coil clip also showed intermediate grade invasive mammary carcinoma.  Prognostics showed ER 100% positive strong staining PR 15% positive strong staining Ki-67 of 10% and HER2 negative by FISH.  Coil clip prognostic showed ER 95% positive strong staining PR 95% positive strong staining Ki-67 of 30% and HER2 negative by [facility]   02/09/2022 Surgery   Bilateral mastectomies Right breast: ILC, intermediate  grade, 1.5cm, LCIS, DCIS, 1LN identifed in breast tissue and negative, T1c, N0   02/09/2022 Cancer Staging   Staging form: Breast, AJCC 8th Edition - Pathologic stage from 02/09/2022: Stage IA (pT1c, pN0, cM0, G2, ER+, PR+, HER2-) - Signed by Percival Brace, NP on 05/05/2022 Stage prefix: Initial diagnosis Histologic grading system: 3 grade system   03/27/2022 -  Chemotherapy   Patient is on Treatment Plan : BREAST MAINTENANCE Trastuzumab  IV (6) or SQ (600) D1 q21d x 13 cycles     03/2022 -  Anti-estrogen oral therapy   Anastrozole    Invasive ductal carcinoma of left breast (HCC)  02/09/2022 Surgery   Bilateral Mastectomies: Left breast mastectomy: IDC, g2, 0.3cm, DCIS, ILC intermediate grade, 0.6cm, LCIS, 1SLN + for ITC, 4 SLN negative for malignancy   02/09/2022 Cancer Staging   Staging form: Breast, AJCC 8th Edition - Pathologic stage from 02/09/2022: Stage IA (pT1b, pN0(i+), cM0, G2, ER+, PR+, HER2+) - Signed by Percival Brace, NP on 05/05/2022 Histologic grading system: 3 grade system   03/27/2022 -  Chemotherapy   Patient is on Treatment Plan : BREAST MAINTENANCE Trastuzumab  IV (6) or SQ (600) D1 q21d x 13 cycles     03/2022 -  Anti-estrogen oral therapy   Anastrozole      INTERVAL HISTORY:  [name] to review her survivorship care plan detailing her treatment course for breast cancer, as well as monitoring long-term side effects of that treatment, education regarding health maintenance, screening, and overall wellness and health promotion.     Overall, [name] reports feeling quite well.  She is healing and recovering from surgery well.  She is taking anastrozole  daily and has had a mild hot flash but otherwise is recovering well.  REVIEW OF SYSTEMS:  Review of Systems  Constitutional:  Negative for appetite change, chills, fatigue, fever and unexpected weight change.  HENT:   Negative for hearing loss, lump/mass and trouble swallowing.   Eyes:  Negative  for eye problems and icterus.  Respiratory:  Negative for chest tightness, cough and shortness of breath.   Cardiovascular:  Negative for chest pain, leg swelling and palpitations.  Gastrointestinal:  Negative for abdominal distention, abdominal pain, constipation, diarrhea, nausea and vomiting.  Endocrine: Positive for hot flashes.  Genitourinary:  Negative for difficulty urinating.   Musculoskeletal:  Negative for arthralgias.  Skin:  Negative for itching and rash.  Neurological:  Negative for dizziness, extremity weakness, headaches and numbness.  Hematological:  Negative for adenopathy. Does not bruise/bleed easily.  Psychiatric/Behavioral:  Negative for depression. The patient is not nervous/anxious.   Breast: Denies any new nodularity, masses, tenderness, nipple changes, or nipple discharge.       PAST MEDICAL/SURGICAL HISTORY:  Past Medical History:  Diagnosis Date   Asthma    Deafness in left ear    WEARS HEARING AID LEFT EAR   GERD (gastroesophageal reflux disease)    Hypertension    Pneumonia    in past x 2   Sleep apnea    Past Surgical History:  Procedure Laterality Date   ABDOMINAL HYSTERECTOMY     BREAST BIOPSY Left 12/30/2021   MM LT BREAST BX W LOC DEV 1ST LESION IMAGE BX SPEC STEREO GUIDE 12/30/2021 [facility]   BREAST BIOPSY Right 01/02/2022   US  RT BREAST BX W LOC DEV 1ST LESION IMG BX SPEC US  GUIDE 01/02/2022 [facility]   BREAST BIOPSY Right 01/02/2022   US  RT BREAST BX W LOC DEV EA ADD LESION IMG BX SPEC US  GUIDE 01/02/2022 [facility]   BREAST BIOPSY Left 12/30/2021   MM LT BREAST BX W LOC DEV EA AD LESION IMG BX SPEC STEREO GUIDE 12/30/2021 [facility]   CHOLECYSTECTOMY     EYE SURGERY  2016   bilateral cataract surgery   NEPHRECTOMY Left 02/04/2015   Procedure: OPEN RADICAL NEPHRECTOMY;  Surgeon: Florencio Hunting, MD;  Location: [facility];  Service: Urology;  Laterality: Left;   PORT-A-CATH REMOVAL N/A 03/08/2023   Procedure:  PORT-A-CATH REMOVAL;  Surgeon: Oza Blumenthal, MD;  Location: [facility];  Service: General;  Laterality: N/A;   PORTACATH PLACEMENT Left 02/09/2022   Procedure: INSERTION PORT-A-CATH;  Surgeon: Oza Blumenthal, MD;  Location: [facility];  Service: General;  Laterality: Left;   SENTINEL NODE BIOPSY Bilateral 02/09/2022   Procedure: BILATERAL SENTINEL NODE BIOPSY;  Surgeon: Oza Blumenthal, MD;  Location: [facility];  Service: General;  Laterality: Bilateral;   TOTAL MASTECTOMY Bilateral 02/09/2022   Procedure: BILATERAL TOTAL MASTECTOMY;  Surgeon: Oza Blumenthal, MD;  Location: [facility];  Service: General;  Laterality: Bilateral;   TUBAL LIGATION       ALLERGIES:  Allergies  Allergen Reactions   Aspirin     Other Reaction(s): nausea (regular aspirin (325mg ) only)   Hydrocodone -Acetaminophen  Nausea And Vomiting   Levofloxacin Nausea And Vomiting   Tramadol  Nausea And Vomiting   Codeine Nausea Only and Rash     CURRENT MEDICATIONS:  Outpatient Encounter Medications as of 05/25/2023  Medication Sig   acetaminophen  (TYLENOL ) 650 MG CR tablet Take 1,300 mg by mouth every 8 (eight) hours as needed for pain.   albuterol  (PROVENTIL  HFA;VENTOLIN  HFA) 108 (90 Base) MCG/ACT inhaler Inhale 1 puff into the lungs every 6 (six) hours as  needed for wheezing or shortness of breath.   amLODipine  (NORVASC ) 10 MG tablet Take 10 mg by mouth daily.   anastrozole  (ARIMIDEX ) 1 MG tablet Take 1 tablet by mouth once daily   aspirin EC 81 MG tablet Take 81 mg by mouth daily.   Cholecalciferol (VITAMIN D) 50 MCG (2000 UT) CAPS Take 4,000 Units by mouth daily.   fluticasone  furoate-vilanterol (BREO ELLIPTA ) 100-25 MCG/ACT AEPB Inhale 1 puff into the lungs daily.   irbesartan -hydrochlorothiazide  (AVALIDE) 300-12.5 MG tablet Take 1 tablet by mouth daily.    levothyroxine  (SYNTHROID ) 50 MCG tablet Take 50 mcg by mouth daily before breakfast.   lidocaine  (LIDODERM ) 5 % Place 1 patch onto the skin daily.  Remove & Discard patch within 12 hours or as directed by MD   lidocaine -prilocaine  (EMLA ) cream Apply topically as needed.   meclizine (ANTIVERT) 25 MG tablet Take 25 mg by mouth 3 (three) times daily as needed for dizziness or nausea.   metoprolol  (LOPRESSOR ) 50 MG tablet Take 50 mg by mouth 2 (two) times daily.    montelukast  (SINGULAIR ) 10 MG tablet Take 10 mg by mouth at bedtime.    rosuvastatin  (CRESTOR ) 5 MG tablet Take 5 mg by mouth daily.   No facility-administered encounter medications on file as of 05/25/2023.     ONCOLOGIC FAMILY HISTORY:  Family History  Problem Relation Age of Onset   Breast cancer Mother 84 - 25   Colon cancer Mother 39   Breast cancer Sister 8 - 71   Colon cancer Sister 53 - 15   Sleep apnea Brother    Cancer Brother        unknown type, possibly stomach cancer   Thyroid  cancer Daughter 44     SOCIAL HISTORY:  Social History   Socioeconomic History   Marital status: Married    Spouse name: Not on file   Number of children: 2   Years of education: Not on file   Highest education level: Not on file  Occupational History   Not on file  Tobacco Use   Smoking status: Never   Smokeless tobacco: Not on file  Substance and Sexual Activity   Alcohol  use: No   Drug use: No   Sexual activity: Not on file  Other Topics Concern   Not on file  Social History Narrative   Not on file   Social Drivers of Health   Financial Resource Strain: Low Risk  (01/30/2022)   Overall Financial Resource Strain (CARDIA)    Difficulty of Paying Living Expenses: Not very hard  Food Insecurity: No Food Insecurity (01/30/2022)   Hunger Vital Sign    Worried About Running Out of Food in the Last Year: Never true    Ran Out of Food in the Last Year: Never true  Transportation Needs: Not on file  Physical Activity: Not on file  Stress: Not on file  Social Connections: Not on file  Intimate Partner Violence: Not on file     OBSERVATIONS/OBJECTIVE:  BP (!)  135/54 (BP Location: Left Arm, Patient Position: Sitting)   Pulse (!) 59   Temp 97.9 F (36.6 C) (Temporal)   Resp 18   Ht 5\' 1"  (1.549 m)   Wt 188 lb 11.2 oz (85.6 kg)   SpO2 97%   BMI 35.65 kg/m  GENERAL: Patient is a well appearing female in no acute distress HEENT:  Sclerae anicteric.  Oropharynx clear and moist. No ulcerations or evidence of oropharyngeal candidiasis. Neck is supple.  NODES:  No cervical, supraclavicular, or axillary lymphadenopathy palpated.  BREAST EXAM:  s/p bilateral mastectomies, no sign of local recurrence LUNGS:  Clear to auscultation bilaterally.  No wheezes or rhonchi. HEART:  Regular rate and rhythm. No murmur appreciated. ABDOMEN:  Soft, nontender.  Positive, normoactive bowel sounds. No organomegaly palpated. MSK:  No focal spinal tenderness to palpation. Full range of motion bilaterally in the upper extremities. EXTREMITIES:  No peripheral edema.   SKIN:  Clear with no obvious rashes or skin changes. No nail dyscrasia. NEURO:  Nonfocal. Well oriented.  Appropriate affect.   LABORATORY DATA:  None for this visit.  DIAGNOSTIC IMAGING:  None for this visit.      ASSESSMENT AND PLAN:  Ms.. Shea is a pleasant 78 y.o. 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  She presents to the Survivorship Clinic for our initial meeting and routine follow-up post-completion of treatment for breast cancer.    1. Bilateral breast cancer:  [name] is continuing to recover from definitive treatment for breast cancer. She will follow-up with her medical oncologist, Dr.  Arno Bibles in 6 months with history and physical exam per surveillance protocol.  She will continue her anti-estrogen therapy with anastrozole . Thus far, she is tolerating the  anastrozole  well, with minimal side effects.   Today, a comprehensive survivorship care plan and treatment summary was reviewed with the patient today detailing her breast cancer diagnosis, treatment course, potential late/long-term effects of treatment, appropriate follow-up care with recommendations for the future, and patient education resources.  A copy of this summary, along with a letter will be sent to the patient's primary care provider via mail/fax/In Basket message after today's visit.    2. Bone health:  Given [name]'s age/history of breast cancer and her current treatment regimen including anti-estrogen therapy with anastrozole , she is at risk for bone demineralization.  Her last DEXA scan was August 11, 2022, which normal.  Repeat testing every 2 years is recommended to occur in July 2026.  She was given education on specific activities to promote bone health.  3. Cancer screening:  Due to [name]'s history and her age, she should receive screening for skin cancers, colon cancer, and gynecologic cancers.  The information and recommendations are listed on the patient's comprehensive care plan/treatment summary and were reviewed in detail with the patient.    4. Health maintenance and wellness promotion: [name] was encouraged to consume 5-7 servings of fruits and vegetables per day. We reviewed the "Nutrition Rainbow" handout.  She was also encouraged to engage in moderate to vigorous exercise for 30 minutes per day most days of the week.  She was instructed to limit her alcohol  consumption and continue to abstain from tobacco use.     5. Support services/counseling: It is not uncommon for this period of the patient's cancer care trajectory to be one of many emotions and stressors.   She was given information regarding our available services and encouraged to contact me with any questions or for help enrolling in any of our support group/programs.    Follow up instructions:     -Return to cancer center in 6 months for follow-up with Dr. Arno Bibles -Bone density testing July, 2026 -She is welcome to return back to the Survivorship Clinic at any time; no additional follow-up needed at this time.  -  Consider referral back to survivorship as a long-term survivor for continued surveillance  The patient was provided an opportunity to ask questions and all were answered. The patient agreed with the plan and demonstrated an understanding of the instructions.   Total encounter time:45 minutes*in face-to-face visit time, chart review, lab review, care coordination, order entry, and documentation of the encounter time.    Alwin Baars, NP 05/25/23 11:46 AM Medical Oncology and Hematology [facility] [address] [place], [place] [address] Tel. [phone]    Fax. [phone]  *Total Encounter Time as defined by the Centers for Medicare and Medicaid Services includes, in addition to the face-to-face time of a patient visit (documented in the note above) non-face-to-face time: obtaining and reviewing outside history, ordering and reviewing medications, tests or procedures, care coordination (communications with other health care professionals or caregivers) and documentation in the medical record.

## 2023-05-26 ENCOUNTER — Telehealth: Payer: Self-pay | Admitting: Hematology and Oncology

## 2023-05-26 NOTE — Telephone Encounter (Signed)
 Confirmed with pt scheduled appt date and time

## 2023-06-08 ENCOUNTER — Other Ambulatory Visit: Payer: Self-pay | Admitting: Hematology and Oncology

## 2023-08-11 DIAGNOSIS — I10 Essential (primary) hypertension: Secondary | ICD-10-CM | POA: Diagnosis not present

## 2023-08-11 DIAGNOSIS — G4733 Obstructive sleep apnea (adult) (pediatric): Secondary | ICD-10-CM | POA: Diagnosis not present

## 2023-09-08 ENCOUNTER — Other Ambulatory Visit: Payer: Self-pay | Admitting: Hematology and Oncology

## 2023-09-09 ENCOUNTER — Other Ambulatory Visit: Payer: Self-pay | Admitting: Hematology and Oncology

## 2023-11-01 ENCOUNTER — Telehealth: Payer: Self-pay | Admitting: Hematology and Oncology

## 2023-11-01 NOTE — Telephone Encounter (Signed)
 I spoke with patient regarding rescheduled appointment from 11/29/2023 to 11/30/2023. Patient aware of date/time change.

## 2023-11-03 DIAGNOSIS — C50912 Malignant neoplasm of unspecified site of left female breast: Secondary | ICD-10-CM | POA: Diagnosis not present

## 2023-11-03 DIAGNOSIS — Z23 Encounter for immunization: Secondary | ICD-10-CM | POA: Diagnosis not present

## 2023-11-03 DIAGNOSIS — M1990 Unspecified osteoarthritis, unspecified site: Secondary | ICD-10-CM | POA: Diagnosis not present

## 2023-11-03 DIAGNOSIS — G4733 Obstructive sleep apnea (adult) (pediatric): Secondary | ICD-10-CM | POA: Diagnosis not present

## 2023-11-03 DIAGNOSIS — E669 Obesity, unspecified: Secondary | ICD-10-CM | POA: Diagnosis not present

## 2023-11-03 DIAGNOSIS — E785 Hyperlipidemia, unspecified: Secondary | ICD-10-CM | POA: Diagnosis not present

## 2023-11-03 DIAGNOSIS — C50911 Malignant neoplasm of unspecified site of right female breast: Secondary | ICD-10-CM | POA: Diagnosis not present

## 2023-11-03 DIAGNOSIS — E039 Hypothyroidism, unspecified: Secondary | ICD-10-CM | POA: Diagnosis not present

## 2023-11-03 DIAGNOSIS — H9120 Sudden idiopathic hearing loss, unspecified ear: Secondary | ICD-10-CM | POA: Diagnosis not present

## 2023-11-03 DIAGNOSIS — K219 Gastro-esophageal reflux disease without esophagitis: Secondary | ICD-10-CM | POA: Diagnosis not present

## 2023-11-03 DIAGNOSIS — Z800 Family history of malignant neoplasm of digestive organs: Secondary | ICD-10-CM | POA: Diagnosis not present

## 2023-11-03 DIAGNOSIS — I10 Essential (primary) hypertension: Secondary | ICD-10-CM | POA: Diagnosis not present

## 2023-11-09 NOTE — Progress Notes (Addendum)
 Patient: [name] Date of Birth: 21-Dec-1945  Reason for Visit: Follow up History from: Patient Primary Neurologist: Buck  ASSESSMENT AND PLAN 78 y.o. year old female   1.  OSA on CPAP  -She has very good compliance.  Recommend continue nightly usage for minimum of 4 hours. -I will reorder ONO, previously ordered twice but was not completed, abnormality reported on her Fitbit during the night -We will continue current settings, discussed a mask refit to see if more comfortable, which for now she wishes to continue fullface mask. -We will follow-up in 1 year or sooner if needed.  HISTORY OF PRESENT ILLNESS: Today 11/09/23   11/10/22 SS: Last time I saw her, I increased her pressure 5-12 to 5-15, her AHI was 8.8. Today, using FFM, it is aggravating to her, can't say why but bothers her. Has not tried any other masks. Goes to bed at 8:30 PM, but only sleeps 6 hours, rest of time lays there. Few nights she missed when not feeling well. Really hasn't noted much subjective benefit. Feels tired all the time, from breast cancer surgery with double mastectomy, is on immune therapy every 3 weeks, only 5 more left. Since CPAP, not sleeping as much during the day.  AHI 4.7, leak 11.3.  ESS 10. She never had ONO.   04/21/22 SS: Here today for initial CPAP visit.  Presented with snoring and daytime somnolence.  Had PSG 02/03/2022 showing mild OSA with at times severe desaturations as low as 79%.  Recommended AutoPap. Is on immunotherapy for breast cancer. Wearing full face mask. Hard to adjust to having mask on her face. Has never even liked wearing her glasses. She does feel like her sleep is better. Not much change to her energy level. In January had double mastectomy, still fatigued from that. Is a mouth breather. Has not felt that she needs more air. Doesn't recall every waking up feeling need more air.  ESS 8  Review of CPAP data 03/21/2022-04/18/2428/30 days at 100%, 100% greater than 4-hour  usage.  Average usage 8 hours 7 minutes.  Minimum pressure 5, maximum pressure 12 cm water.  Pressure 95th percentile 11.9, maximum 12.0.  Leak 6.2, AHI 8.8.  HISTORY  10/01/21 Dr. Buck: I saw your patient, [name], kind request in the sleep clinic today for initial consultation of her sleep disorder, in particular, concern for underlying obstructive sleep apnea.  The patient is unaccompanied today.  As you know, [name] is a 78 year old right-handed woman with an underlying medical history of left ear hearing loss, hypertension, reflux disease, asthma, allergies, degenerative joint disease and degenerative disc disease, low back pain, history of chest pain, and obesity, who reports snoring and excessive daytime somnolence.  I reviewed your office note from 09/18/2021.  Her Epworth sleepiness score is 11 out of 24, fatigue severity score is 44 out of 63.  She lives with her husband.  She has 2 grown children, 2 grandchildren and 2 step great-grandchildren.  She is a non-smoker and drinks caffeine in the form of coffee, about 2 cups/day and occasional tea, no alcohol  currently.  She does watch TV in her bedroom until her husband turns it off.  She is generally in bed by 830 and watches TV for at least an hour or so.  Rise time is around 7 but she often wakes up between 3 and 5 AM and then goes to the sofa to not disturb her husband.  They have a dog in the  household, the dog sleeps on a his bed on the floor.  She is a retired geneticist, molecular.  Her weight has been more or less stable.  Her brother has sleep apnea.  She has nocturia about once or twice per average night and has woken up occasionally with a headache which she attributes to sinus pressure.  She does have allergies and gets allergy shots once a week.  She has asthma and uses Breo inhaler and she also takes montelukast .  She has a rescue inhaler as well.  Occasionally she will take an over-the-counter PM type medication at night.   REVIEW OF  SYSTEMS: Out of a complete 14 system review of symptoms, the patient complains only of the following symptoms, and all other reviewed systems are negative.  See HPI  ALLERGIES: Allergies  Allergen Reactions   Aspirin     Other Reaction(s): nausea (regular aspirin (325mg ) only)   Hydrocodone -Acetaminophen  Nausea And Vomiting   Levofloxacin Nausea And Vomiting   Tramadol  Nausea And Vomiting   Codeine Nausea Only and Rash    HOME MEDICATIONS: Outpatient Medications Prior to Visit  Medication Sig Dispense Refill   acetaminophen  (TYLENOL ) 650 MG CR tablet Take 1,300 mg by mouth every 8 (eight) hours as needed for pain.     albuterol  (PROVENTIL  HFA;VENTOLIN  HFA) 108 (90 Base) MCG/ACT inhaler Inhale 1 puff into the lungs every 6 (six) hours as needed for wheezing or shortness of breath.     amLODipine  (NORVASC ) 10 MG tablet Take 10 mg by mouth daily.     anastrozole  (ARIMIDEX ) 1 MG tablet Take 1 tablet by mouth once daily 90 tablet 0   aspirin EC 81 MG tablet Take 81 mg by mouth daily.     Cholecalciferol (VITAMIN D) 50 MCG (2000 UT) CAPS Take 4,000 Units by mouth daily.     fluticasone  furoate-vilanterol (BREO ELLIPTA ) 100-25 MCG/ACT AEPB Inhale 1 puff into the lungs daily.     irbesartan -hydrochlorothiazide  (AVALIDE) 300-12.5 MG tablet Take 1 tablet by mouth daily.      levothyroxine  (SYNTHROID ) 50 MCG tablet Take 50 mcg by mouth daily before breakfast.     lidocaine  (LIDODERM ) 5 % Place 1 patch onto the skin daily. Remove & Discard patch within 12 hours or as directed by MD 30 patch 0   lidocaine -prilocaine  (EMLA ) cream Apply topically as needed. 1 each 0   meclizine (ANTIVERT) 25 MG tablet Take 25 mg by mouth 3 (three) times daily as needed for dizziness or nausea.     metoprolol  (LOPRESSOR ) 50 MG tablet Take 50 mg by mouth 2 (two) times daily.      montelukast  (SINGULAIR ) 10 MG tablet Take 10 mg by mouth at bedtime.      rosuvastatin  (CRESTOR ) 5 MG tablet Take 5 mg by mouth daily.      No facility-administered medications prior to visit.    PAST MEDICAL HISTORY: Past Medical History:  Diagnosis Date   Asthma    Deafness in left ear    WEARS HEARING AID LEFT EAR   GERD (gastroesophageal reflux disease)    Hypertension    Pneumonia    in past x 2   Port-A-Cath in place 04/17/2022   Sleep apnea     PAST SURGICAL HISTORY: Past Surgical History:  Procedure Laterality Date   ABDOMINAL HYSTERECTOMY     BREAST BIOPSY Left 12/30/2021   MM LT BREAST BX W LOC DEV 1ST LESION IMAGE BX SPEC STEREO GUIDE 12/30/2021 [facility]   BREAST BIOPSY  Right 01/02/2022   US  RT BREAST BX W LOC DEV 1ST LESION IMG BX SPEC US  GUIDE 01/02/2022 [facility]   BREAST BIOPSY Right 01/02/2022   US  RT BREAST BX W LOC DEV EA ADD LESION IMG BX SPEC US  GUIDE 01/02/2022 [facility]   BREAST BIOPSY Left 12/30/2021   MM LT BREAST BX W LOC DEV EA AD LESION IMG BX SPEC STEREO GUIDE 12/30/2021 [facility]   CHOLECYSTECTOMY     EYE SURGERY  2016   bilateral cataract surgery   NEPHRECTOMY Left 02/04/2015   Procedure: OPEN RADICAL NEPHRECTOMY;  Surgeon: Gretel Ferrara, MD;  Location: [facility];  Service: Urology;  Laterality: Left;   PORT-A-CATH REMOVAL N/A 03/08/2023   Procedure: PORT-A-CATH REMOVAL;  Surgeon: Vernetta Berg, MD;  Location: [facility];  Service: General;  Laterality: N/A;   PORTACATH PLACEMENT Left 02/09/2022   Procedure: INSERTION PORT-A-CATH;  Surgeon: Vernetta Berg, MD;  Location: [facility];  Service: General;  Laterality: Left;   SENTINEL NODE BIOPSY Bilateral 02/09/2022   Procedure: BILATERAL SENTINEL NODE BIOPSY;  Surgeon: Vernetta Berg, MD;  Location: [facility];  Service: General;  Laterality: Bilateral;   TOTAL MASTECTOMY Bilateral 02/09/2022   Procedure: BILATERAL TOTAL MASTECTOMY;  Surgeon: Vernetta Berg, MD;  Location: [facility];  Service: General;  Laterality: Bilateral;   TUBAL LIGATION      FAMILY HISTORY: Family History   Problem Relation Age of Onset   Breast cancer Mother 40 - 92   Colon cancer Mother 55   Breast cancer Sister 21 - 72   Colon cancer Sister 4 - 69   Sleep apnea Brother    Cancer Brother        unknown type, possibly stomach cancer   Thyroid  cancer Daughter 60    SOCIAL HISTORY: Social History   Socioeconomic History   Marital status: Married    Spouse name: Not on file   Number of children: 2   Years of education: Not on file   Highest education level: Not on file  Occupational History   Not on file  Tobacco Use   Smoking status: Never   Smokeless tobacco: Not on file  Substance and Sexual Activity   Alcohol  use: No   Drug use: No   Sexual activity: Not on file  Other Topics Concern   Not on file  Social History Narrative   Not on file   Social Drivers of Health   Financial Resource Strain: Low Risk  (01/30/2022)   Overall Financial Resource Strain (CARDIA)    Difficulty of Paying Living Expenses: Not very hard  Food Insecurity: No Food Insecurity (01/30/2022)   Hunger Vital Sign    Worried About Running Out of Food in the Last Year: Never true    Ran Out of Food in the Last Year: Never true  Transportation Needs: Not on file  Physical Activity: Not on file  Stress: Not on file  Social Connections: Not on file  Intimate Partner Violence: Not on file   PHYSICAL EXAM  There were no vitals filed for this visit.   There is no height or weight on file to calculate BMI.  Generalized: Well developed, in no acute distress  Neurological examination  Mentation: Alert oriented to time, place, history taking. Follows all commands speech and language fluent Motor: Moves all extremities independent. Gait and station: Gait is normal.  DIAGNOSTIC DATA (LABS, IMAGING, TESTING) - I reviewed patient records, labs, notes, testing and imaging myself where available.  Lab  Results  Component Value Date   WBC 8.2 12/22/2022   HGB 12.7 12/22/2022   HCT 40.4 12/22/2022    MCV 82.6 12/22/2022   PLT 281 12/22/2022      Component Value Date/Time   NA 138 03/01/2023 1613   K 4.4 03/01/2023 1613   CL 104 03/01/2023 1613   CO2 28 03/01/2023 1613   GLUCOSE 87 03/01/2023 1613   BUN 15 03/01/2023 1613   CREATININE 0.86 03/01/2023 1613   CREATININE 0.83 12/22/2022 1059   CALCIUM  9.1 03/01/2023 1613   PROT 7.6 12/22/2022 1059   ALBUMIN 3.9 12/22/2022 1059   AST 21 12/22/2022 1059   ALT 22 12/22/2022 1059   ALKPHOS 90 12/22/2022 1059   BILITOT 0.5 12/22/2022 1059   GFRNONAA >60 03/01/2023 1613   GFRNONAA >60 12/22/2022 1059   GFRAA >60 02/07/2015 0439   No results found for: CHOL, HDL, LDLCALC, LDLDIRECT, TRIG, CHOLHDL No results found for: YHAJ8R No results found for: VITAMINB12 No results found for: TSH  Lauraine Born, AGNP-C, DNP 11/09/2023, 4:34 PM [facility] [address] [place], [place] [address] [phone]

## 2023-11-10 ENCOUNTER — Encounter: Payer: Self-pay | Admitting: Neurology

## 2023-11-10 ENCOUNTER — Ambulatory Visit: Payer: Medicare HMO | Admitting: Neurology

## 2023-11-10 VITALS — BP 156/78 | HR 65 | Ht 63.0 in | Wt 189.4 lb

## 2023-11-10 DIAGNOSIS — G4733 Obstructive sleep apnea (adult) (pediatric): Secondary | ICD-10-CM

## 2023-11-10 DIAGNOSIS — B0223 Postherpetic polyneuropathy: Secondary | ICD-10-CM | POA: Insufficient documentation

## 2023-11-10 NOTE — Patient Instructions (Signed)
 Check ONO, wear your CPAP, evaluating your oxygen with CPAP. Mask refit, consider nasal pillow mask. After mask refit, reach out in 1 month and I can pull data to review. Thanks!

## 2023-11-10 NOTE — Progress Notes (Addendum)
 Sent community message to [facility] that mask refit and ONO order placed.

## 2023-11-11 ENCOUNTER — Telehealth: Payer: Self-pay

## 2023-11-11 NOTE — Telephone Encounter (Signed)
 Order sent to dme

## 2023-11-16 ENCOUNTER — Encounter: Payer: Self-pay | Admitting: Neurology

## 2023-11-18 DIAGNOSIS — I10 Essential (primary) hypertension: Secondary | ICD-10-CM | POA: Diagnosis not present

## 2023-11-18 DIAGNOSIS — G4733 Obstructive sleep apnea (adult) (pediatric): Secondary | ICD-10-CM | POA: Diagnosis not present

## 2023-11-19 DIAGNOSIS — R0681 Apnea, not elsewhere classified: Secondary | ICD-10-CM | POA: Diagnosis not present

## 2023-11-22 ENCOUNTER — Telehealth: Payer: Self-pay | Admitting: Neurology

## 2023-11-22 NOTE — Telephone Encounter (Signed)
 [facility] Patient [name] Born, NP ordered overnight ONO test and the diagnosis is not approved by patient's insurance. Faxed over list of [employer] approved diagnosis codes and Lauraine, NP signed and faxed back, but did not mark off any code on the list.  Can fax over the order form signed and dated and have them mark off a diagnosis and. Fax to :  [phone].

## 2023-11-23 NOTE — Telephone Encounter (Signed)
 Faxed to Dr. Janey [phone]. ONO.

## 2023-11-23 NOTE — Telephone Encounter (Signed)
 ONO from 11/18/2023 showed 8 hours and 30 minutes of testing.  Time less than 88% 1 minute 21 seconds.  High SpO2 99%, low SpO2 84%, basal SpO2 94.9%.  Time consecutive less than 88% only 43 seconds.  Pulse range 42-71, average 51. Time in bradycardia % 95.9.  Does not qualify for oxygen.  Would recommend following up with primary care about low heart rate to ensure no further evaluation is needed. Thanks

## 2023-11-24 ENCOUNTER — Telehealth: Payer: Self-pay

## 2023-11-24 NOTE — Telephone Encounter (Signed)
 Pt called and states she saw a MyChart message stating she needs a lab done by 12/2023. She has a MD f/u scheduled for 11/18 but there is not currently a message about labs. Advised pt if MD wants labs she can certainly have them added after her visit 11/18.  She is agreeable.

## 2023-11-29 ENCOUNTER — Ambulatory Visit: Admitting: Hematology and Oncology

## 2023-11-29 ENCOUNTER — Telehealth: Payer: Self-pay

## 2023-11-29 NOTE — Telephone Encounter (Signed)
 Spoke with patient and confirmed appointment on 11/18

## 2023-11-30 ENCOUNTER — Inpatient Hospital Stay: Attending: Hematology and Oncology | Admitting: Hematology and Oncology

## 2023-11-30 VITALS — BP 137/60 | HR 65 | Temp 98.0°F | Resp 18 | Wt 186.3 lb

## 2023-11-30 DIAGNOSIS — R232 Flushing: Secondary | ICD-10-CM | POA: Diagnosis not present

## 2023-11-30 DIAGNOSIS — Z170 Estrogen receptor positive status [ER+]: Secondary | ICD-10-CM | POA: Diagnosis not present

## 2023-11-30 DIAGNOSIS — Z9013 Acquired absence of bilateral breasts and nipples: Secondary | ICD-10-CM | POA: Diagnosis not present

## 2023-11-30 DIAGNOSIS — C50811 Malignant neoplasm of overlapping sites of right female breast: Secondary | ICD-10-CM | POA: Diagnosis not present

## 2023-11-30 DIAGNOSIS — Z9221 Personal history of antineoplastic chemotherapy: Secondary | ICD-10-CM | POA: Diagnosis not present

## 2023-11-30 DIAGNOSIS — Z800 Family history of malignant neoplasm of digestive organs: Secondary | ICD-10-CM | POA: Diagnosis not present

## 2023-11-30 DIAGNOSIS — C50212 Malignant neoplasm of upper-inner quadrant of left female breast: Secondary | ICD-10-CM | POA: Diagnosis not present

## 2023-11-30 DIAGNOSIS — C50911 Malignant neoplasm of unspecified site of right female breast: Secondary | ICD-10-CM

## 2023-11-30 DIAGNOSIS — Z808 Family history of malignant neoplasm of other organs or systems: Secondary | ICD-10-CM | POA: Diagnosis not present

## 2023-11-30 DIAGNOSIS — Z803 Family history of malignant neoplasm of breast: Secondary | ICD-10-CM | POA: Diagnosis not present

## 2023-11-30 DIAGNOSIS — Z79811 Long term (current) use of aromatase inhibitors: Secondary | ICD-10-CM | POA: Diagnosis not present

## 2023-11-30 NOTE — Progress Notes (Unsigned)
  Cancer Center Cancer Follow up:    Wendy Santos, MD [address] 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 This is a highly suspicious finding for which ultrasound-guided biopsy is recommended. Adjacent/nearly contiguous complex cystic and solid mass within the RIGHT breast at the 9:30 o'clock axis, 3 cm from the nipple, measuring 1.3 cm. This is a suspicious finding for which ultrasound-guided biopsy is recommended. Highly suspicious calcifications within the inner LEFT breast, with a segmental distribution, measuring 5.7 cm extent. Recommend 2 site stereotactic biopsy to include the more posterior calcifications and the more anterior/retroareolar calcifications.    12/30/2021 Pathology Results   Left breast needle core biopsy from post extent microcalcs showed IDC, overall grade 3.  Prognostics showed ER 100% positive, strong staining intensity, PR 100% positive, strong staining intensity, Ki 67 of 20%. 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  Second area in the right breast needle core biopsy coil clip also showed intermediate grade invasive mammary carcinoma.  Prognostics showed ER 100% positive strong staining PR 15% positive strong staining Ki-67 of 10% and HER2 negative by FISH.  Coil clip prognostic showed ER 95% positive strong staining PR 95% positive strong staining Ki-67 of 30% and HER2 negative by FISH   02/09/2022 Surgery   Bilateral mastectomies Right breast: ILC, intermediate grade, 1.5cm, LCIS, DCIS, 1LN identifed in breast tissue and negative, T1c, N0   02/09/2022 Cancer Staging   Staging form: Breast, AJCC 8th Edition - Pathologic stage from 02/09/2022: Stage IA (pT1c, pN0, cM0, G2, ER+, PR+, HER2-) - Signed by Crawford Morna Pickle, NP on 05/05/2022 Stage prefix: Initial diagnosis Histologic grading system: 3 grade system   03/27/2022 - 02/22/2023 Chemotherapy   Patient is on Treatment Plan : BREAST MAINTENANCE Trastuzumab  IV (6) or SQ (600) D1 q21d x 13 cycles     03/2022 -  Anti-estrogen oral therapy   Anastrozole    Invasive ductal carcinoma of left breast (HCC)  02/09/2022 Surgery   Bilateral Mastectomies: Left breast mastectomy: IDC, g2, 0.3cm, DCIS, ILC intermediate grade, 0.6cm, LCIS, 1SLN + for ITC, 4 SLN negative for malignancy   02/09/2022 Cancer Staging   Staging form: Breast, AJCC 8th Edition - Pathologic stage from 02/09/2022: Stage IA (pT1b, pN0(i+), cM0, G2, ER+, PR+, HER2+) - Signed by Crawford Morna Pickle, NP on 05/05/2022 Histologic grading system: 3 grade system   03/27/2022 - 02/22/2023  Chemotherapy   Patient is on Treatment Plan : BREAST MAINTENANCE Trastuzumab  IV (6) or SQ (600) D1 q21d x 13 cycles     03/2022 -  Anti-estrogen oral therapy    Anastrozole      CURRENT THERAPY: Anastrozole /Herceptin   INTERVAL HISTORY:  [name] 78 y.o. female returns for f/u on Anastrozole  and Herceptin .   Discussed the use of AI scribe software for clinical note transcription with the patient, who gave verbal consent to proceed.  History of Present Illness [name] is a 78 year old female with a history of bilateral breast cancer who presents for follow-up.  She is currently on anastrozole  and tolerates it well, experiencing only minor hot flashes as a side effect. She completed one year of Herceptin  treatment in February 2025.  She takes vitamin D sporadically but has increased her milk intake recently. Her bone density was previously noted to be good.  She received her flu vaccine approximately three weeks ago  Rest of the pertinent 10 point ROS reviewed and neg.   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  MEDICAL HISTORY: Past Medical History:  Diagnosis Date   Asthma    Deafness in left ear    WEARS HEARING AID LEFT EAR   GERD (gastroesophageal reflux disease)    Hypertension    Pneumonia    in past x 2   Port-A-Cath in place 04/17/2022   Sleep apnea     SURGICAL HISTORY: Past Surgical History:  Procedure Laterality Date   ABDOMINAL HYSTERECTOMY     BREAST BIOPSY Left 12/30/2021   MM LT BREAST BX W LOC DEV 1ST LESION IMAGE BX SPEC STEREO GUIDE 12/30/2021 [facility]   BREAST BIOPSY Right 01/02/2022    US  RT BREAST BX W LOC DEV 1ST LESION IMG BX SPEC US  GUIDE 01/02/2022 [facility]   BREAST BIOPSY Right 01/02/2022   US  RT BREAST BX W LOC DEV EA ADD LESION IMG BX SPEC US  GUIDE 01/02/2022 [facility]   BREAST BIOPSY Left 12/30/2021   MM LT BREAST BX W LOC DEV EA AD LESION IMG BX SPEC STEREO GUIDE 12/30/2021 [facility]   CHOLECYSTECTOMY     EYE SURGERY  2016   bilateral cataract surgery   NEPHRECTOMY Left 02/04/2015   Procedure: OPEN RADICAL NEPHRECTOMY;  Surgeon: Gretel Ferrara, MD;  Location: [facility];  Service: Urology;  Laterality: Left;   PORT-A-CATH REMOVAL N/A 03/08/2023   Procedure: PORT-A-CATH REMOVAL;  Surgeon: Vernetta Berg, MD;  Location: [facility];  Service: General;  Laterality: N/A;   PORTACATH PLACEMENT Left 02/09/2022   Procedure: INSERTION PORT-A-CATH;  Surgeon: Vernetta Berg, MD;  Location[facility];  Service: General;  Laterality: Left;   SENTINEL NODE BIOPSY Bilateral 02/09/2022   Procedure: BILATERAL SENTINEL NODE BIOPSY;  Surgeon: Vernetta Berg, MD;  Location: [facility];  Service: General;  Laterality: Bilateral;   TOTAL MASTECTOMY Bilateral 02/09/2022   Procedure: BILATERAL TOTAL MASTECTOMY;  Surgeon: Vernetta Berg, MD;  Location: [facility] OR;  Service: General;  Laterality: Bilateral;   TUBAL  LIGATION      SOCIAL HISTORY: Social History   Socioeconomic History   Marital status: Married    Spouse name: Not on file   Number of children: 2   Years of education: Not on file   Highest education level: Not on file  Occupational History   Not on file  Tobacco Use   Smoking status: Never   Smokeless tobacco: Not on file  Substance and Sexual Activity   Alcohol  use: No   Drug use: No   Sexual activity: Not on file  Other Topics Concern   Not on file  Social History Narrative   Not on file   Social Drivers of Health   Financial Resource Strain: Low Risk  (01/30/2022)   Overall Financial Resource Strain (CARDIA)     Difficulty of Paying Living Expenses: Not very hard  Food Insecurity: No Food Insecurity (01/30/2022)   Hunger Vital Sign    Worried About Running Out of Food in the Last Year: Never true    Ran Out of Food in the Last Year: Never true  Transportation Needs: Not on file  Physical Activity: Not on file  Stress: Not on file  Social Connections: Not on file  Intimate Partner Violence: Not on file    FAMILY HISTORY: Family History  Problem Relation Age of Onset   Breast cancer Mother 32 - 76   Colon cancer Mother 70   Breast cancer Sister 34 - 15   Colon cancer Sister 37 - 47   Sleep apnea Brother    Cancer Brother        unknown type, possibly stomach cancer   Thyroid  cancer Daughter 14    Review of Systems  Constitutional:  Negative for appetite change, chills, fatigue, fever and unexpected weight change.  HENT:   Negative for hearing loss, lump/mass and trouble swallowing.   Eyes:  Negative for eye problems and icterus.  Respiratory:  Negative for chest tightness, cough and shortness of breath.   Cardiovascular:  Negative for chest pain, leg swelling and palpitations.  Gastrointestinal:  Negative for abdominal distention, abdominal pain, constipation, diarrhea, nausea and vomiting.  Endocrine: Negative for hot flashes.  Genitourinary:  Negative for difficulty urinating.   Musculoskeletal:  Negative for arthralgias.  Skin:  Negative for itching and rash.  Neurological:  Negative for dizziness, extremity weakness, headaches and numbness.  Hematological:  Negative for adenopathy. Does not bruise/bleed easily.  Psychiatric/Behavioral:  Negative for depression. The patient is not nervous/anxious.       PHYSICAL EXAMINATION     Vitals:   11/30/23 1412  BP: 137/60  Pulse: 65  Resp: 18  Temp: 98 F (36.7 C)  SpO2: 96%    Physical Exam Constitutional:      General: She is not in acute distress.    Appearance: Normal appearance. She is not toxic-appearing.  HENT:      Head: Normocephalic and atraumatic.     Mouth/Throat:     Mouth: Mucous membranes are moist.     Pharynx: Oropharynx is clear. No oropharyngeal exudate or posterior oropharyngeal erythema.  Eyes:     General: No scleral icterus. Cardiovascular:     Rate and Rhythm: Normal rate and regular rhythm.     Pulses: Normal pulses.     Heart sounds: Normal heart sounds.  Pulmonary:     Effort: Pulmonary effort is normal.     Breath sounds: Normal breath sounds.  Abdominal:     General: Abdomen is flat. Bowel  sounds are normal. There is no distension.     Palpations: Abdomen is soft.     Tenderness: There is no abdominal tenderness.  Musculoskeletal:        General: No swelling.     Cervical back: Neck supple.  Lymphadenopathy:     Cervical: No cervical adenopathy.  Skin:    General: Skin is warm and dry.     Findings: No rash.  Neurological:     General: No focal deficit present.     Mental Status: She is alert.  Psychiatric:        Mood and Affect: Mood normal.        Behavior: Behavior normal.     LABORATORY DATA:  CBC    Component Value Date/Time   WBC 8.2 12/22/2022 1059   WBC 10.9 (H) 11/23/2022 2235   RBC 4.89 12/22/2022 1059   HGB 12.7 12/22/2022 1059   HCT 40.4 12/22/2022 1059   PLT 281 12/22/2022 1059   MCV 82.6 12/22/2022 1059   MCH 26.0 12/22/2022 1059   MCHC 31.4 12/22/2022 1059   RDW 14.6 12/22/2022 1059   LYMPHSABS 3.4 12/22/2022 1059   MONOABS 0.9 12/22/2022 1059   EOSABS 0.3 12/22/2022 1059   BASOSABS 0.0 12/22/2022 1059    CMP     Component Value Date/Time   NA 138 03/01/2023 1613   K 4.4 03/01/2023 1613   CL 104 03/01/2023 1613   CO2 28 03/01/2023 1613   GLUCOSE 87 03/01/2023 1613   BUN 15 03/01/2023 1613   CREATININE 0.86 03/01/2023 1613   CREATININE 0.83 12/22/2022 1059   CALCIUM  9.1 03/01/2023 1613   PROT 7.6 12/22/2022 1059   ALBUMIN 3.9 12/22/2022 1059   AST 21 12/22/2022 1059   ALT 22 12/22/2022 1059   ALKPHOS 90 12/22/2022 1059    BILITOT 0.5 12/22/2022 1059   GFRNONAA >60 03/01/2023 1613   GFRNONAA >60 12/22/2022 1059   GFRAA >60 02/07/2015 0439      ASSESSMENT and THERAPY PLAN:   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   She had bilateral breast cancer.  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  On the left side, she has Her 2 amplified. She is now on anastrozole  and herceptin  ( will complete 1 yr in Feb 2025) She is tolerating this extremely well.  Assessment and Plan Assessment & Plan History of bilateral breast cancer (lobular right, HER2-positive left) Completed Herceptin  therapy. Tolerating anastrozole  well. Discussed MRD testing. - Provided pamphlet for Guardant reveal. - No concerns on ROS or PE today - Schedule follow-up in six months.  Hot flashes associated with anastrozole  therapy Mild hot flashes from anastrozole , no significant quality of life impact. - Continue anastrozole  therapy. Ok to monitor  Baseline bone density scan shows normal density Continue weight bearing exercises, calcium  and vit D supplementation.   Amber Stalls MD    All questions were answered. The patient knows to call the clinic with any problems, questions or concerns. We can certainly see the patient much sooner if necessary.

## 2023-12-01 ENCOUNTER — Encounter: Payer: Self-pay | Admitting: Hematology and Oncology

## 2023-12-01 NOTE — Assessment & Plan Note (Signed)
 This is a very pleasant 78 year old female patient with bilateral breast cancer, right-sided biopsy showing invasive lobular cancer, ER/PR positive HER2 negative referred to medical oncology for recommendations.   She had bilateral breast cancer.  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  On the left side, she has Her 2 amplified. She is now on anastrozole  and herceptin  ( will complete 1 yr in Feb 2025) She is tolerating this extremely well.  Assessment and Plan Assessment & Plan History of bilateral breast cancer (lobular right, HER2-positive left) Completed Herceptin  therapy. Tolerating anastrozole  well. Discussed MRD testing. - Provided pamphlet for Guardant reveal. - No concerns on ROS or PE today - Schedule follow-up in six months.  Hot flashes associated with anastrozole  therapy Mild hot flashes from anastrozole , no significant quality of life impact. - Continue anastrozole  therapy. Ok to monitor  Baseline bone density scan shows normal density Continue weight bearing exercises, calcium  and vit D supplementation.   Amber Stalls MD

## 2023-12-11 ENCOUNTER — Other Ambulatory Visit: Payer: Self-pay | Admitting: Hematology and Oncology

## 2024-02-17 ENCOUNTER — Encounter: Payer: Self-pay | Admitting: Neurology

## 2024-05-30 ENCOUNTER — Inpatient Hospital Stay: Admitting: Hematology and Oncology

## 2024-11-16 ENCOUNTER — Ambulatory Visit: Admitting: Neurology
